# Patient Record
Sex: Female | Born: 1945 | Race: White | Hispanic: No | Smoking: Never smoker
Health system: Southern US, Community
[De-identification: ages and names within clinical notes are randomized; demographics above are authoritative.]

## PROBLEM LIST (undated history)

## (undated) DIAGNOSIS — T8859XA Other complications of anesthesia, initial encounter: Secondary | ICD-10-CM

## (undated) DIAGNOSIS — K644 Residual hemorrhoidal skin tags: Secondary | ICD-10-CM

## (undated) DIAGNOSIS — K8020 Calculus of gallbladder without cholecystitis without obstruction: Secondary | ICD-10-CM

## (undated) DIAGNOSIS — M7541 Impingement syndrome of right shoulder: Secondary | ICD-10-CM

## (undated) DIAGNOSIS — M1990 Unspecified osteoarthritis, unspecified site: Secondary | ICD-10-CM

## (undated) DIAGNOSIS — Z1371 Encounter for nonprocreative screening for genetic disease carrier status: Secondary | ICD-10-CM

## (undated) DIAGNOSIS — H269 Unspecified cataract: Secondary | ICD-10-CM

## (undated) DIAGNOSIS — Z9889 Other specified postprocedural states: Secondary | ICD-10-CM

## (undated) DIAGNOSIS — H3530 Unspecified macular degeneration: Secondary | ICD-10-CM

## (undated) DIAGNOSIS — I700 Atherosclerosis of aorta: Secondary | ICD-10-CM

## (undated) DIAGNOSIS — T4145XA Adverse effect of unspecified anesthetic, initial encounter: Secondary | ICD-10-CM

## (undated) DIAGNOSIS — R319 Hematuria, unspecified: Secondary | ICD-10-CM

## (undated) DIAGNOSIS — K648 Other hemorrhoids: Secondary | ICD-10-CM

## (undated) DIAGNOSIS — M8580 Other specified disorders of bone density and structure, unspecified site: Secondary | ICD-10-CM

## (undated) DIAGNOSIS — E039 Hypothyroidism, unspecified: Secondary | ICD-10-CM

## (undated) DIAGNOSIS — R053 Chronic cough: Secondary | ICD-10-CM

## (undated) DIAGNOSIS — E559 Vitamin D deficiency, unspecified: Secondary | ICD-10-CM

## (undated) DIAGNOSIS — E785 Hyperlipidemia, unspecified: Secondary | ICD-10-CM

## (undated) DIAGNOSIS — J479 Bronchiectasis, uncomplicated: Secondary | ICD-10-CM

## (undated) DIAGNOSIS — Z8042 Family history of malignant neoplasm of prostate: Secondary | ICD-10-CM

## (undated) DIAGNOSIS — Z8709 Personal history of other diseases of the respiratory system: Secondary | ICD-10-CM

## (undated) DIAGNOSIS — R0600 Dyspnea, unspecified: Secondary | ICD-10-CM

## (undated) DIAGNOSIS — C50919 Malignant neoplasm of unspecified site of unspecified female breast: Secondary | ICD-10-CM

## (undated) DIAGNOSIS — Z87442 Personal history of urinary calculi: Secondary | ICD-10-CM

## (undated) DIAGNOSIS — Z8719 Personal history of other diseases of the digestive system: Secondary | ICD-10-CM

## (undated) DIAGNOSIS — N950 Postmenopausal bleeding: Secondary | ICD-10-CM

## (undated) DIAGNOSIS — N840 Polyp of corpus uteri: Secondary | ICD-10-CM

## (undated) DIAGNOSIS — B009 Herpesviral infection, unspecified: Secondary | ICD-10-CM

## (undated) DIAGNOSIS — R911 Solitary pulmonary nodule: Secondary | ICD-10-CM

## (undated) DIAGNOSIS — Z8041 Family history of malignant neoplasm of ovary: Secondary | ICD-10-CM

## (undated) DIAGNOSIS — Z973 Presence of spectacles and contact lenses: Secondary | ICD-10-CM

## (undated) DIAGNOSIS — Z923 Personal history of irradiation: Secondary | ICD-10-CM

## (undated) DIAGNOSIS — D649 Anemia, unspecified: Secondary | ICD-10-CM

## (undated) DIAGNOSIS — M810 Age-related osteoporosis without current pathological fracture: Secondary | ICD-10-CM

## (undated) DIAGNOSIS — Z803 Family history of malignant neoplasm of breast: Secondary | ICD-10-CM

## (undated) DIAGNOSIS — H04129 Dry eye syndrome of unspecified lacrimal gland: Secondary | ICD-10-CM

## (undated) DIAGNOSIS — I890 Lymphedema, not elsewhere classified: Secondary | ICD-10-CM

## (undated) DIAGNOSIS — R112 Nausea with vomiting, unspecified: Secondary | ICD-10-CM

## (undated) DIAGNOSIS — R05 Cough: Secondary | ICD-10-CM

## (undated) DIAGNOSIS — K219 Gastro-esophageal reflux disease without esophagitis: Secondary | ICD-10-CM

## (undated) HISTORY — DX: Dry eye syndrome of unspecified lacrimal gland: H04.129

## (undated) HISTORY — DX: Encounter for nonprocreative screening for genetic disease carrier status: Z13.71

## (undated) HISTORY — DX: Family history of malignant neoplasm of prostate: Z80.42

## (undated) HISTORY — DX: Anemia, unspecified: D64.9

## (undated) HISTORY — DX: Age-related osteoporosis without current pathological fracture: M81.0

## (undated) HISTORY — DX: Family history of malignant neoplasm of ovary: Z80.41

## (undated) HISTORY — DX: Other specified disorders of bone density and structure, unspecified site: M85.80

## (undated) HISTORY — DX: Unspecified macular degeneration: H35.30

## (undated) HISTORY — DX: Family history of malignant neoplasm of breast: Z80.3

## (undated) HISTORY — DX: Unspecified osteoarthritis, unspecified site: M19.90

## (undated) HISTORY — DX: Malignant neoplasm of unspecified site of unspecified female breast: C50.919

## (undated) HISTORY — DX: Herpesviral infection, unspecified: B00.9

## (undated) HISTORY — PX: FOOT NEUROMA SURGERY: SHX646

## (undated) HISTORY — PX: TONSILLECTOMY AND ADENOIDECTOMY: SHX28

---
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
## 1979-05-17 HISTORY — PX: AUGMENTATION MAMMAPLASTY: SUR837

## 1979-05-17 HISTORY — PX: TUBAL LIGATION: SHX77

## 1980-05-16 HISTORY — PX: BREAST IMPLANT REMOVAL: SHX5361

## 1996-05-16 DIAGNOSIS — M810 Age-related osteoporosis without current pathological fracture: Secondary | ICD-10-CM

## 1996-05-16 HISTORY — DX: Age-related osteoporosis without current pathological fracture: M81.0

## 1997-12-14 HISTORY — PX: ELBOW SURGERY: SHX618

## 1998-08-21 ENCOUNTER — Other Ambulatory Visit: Admission: RE | Admit: 1998-08-21 | Discharge: 1998-08-21 | Payer: Self-pay | Admitting: Obstetrics and Gynecology

## 1998-11-27 ENCOUNTER — Other Ambulatory Visit: Admission: RE | Admit: 1998-11-27 | Discharge: 1998-11-27 | Payer: Self-pay | Admitting: Obstetrics and Gynecology

## 1999-06-11 ENCOUNTER — Other Ambulatory Visit: Admission: RE | Admit: 1999-06-11 | Discharge: 1999-06-11 | Payer: Self-pay | Admitting: Obstetrics and Gynecology

## 1999-07-12 ENCOUNTER — Encounter: Payer: Self-pay | Admitting: Obstetrics and Gynecology

## 1999-07-12 ENCOUNTER — Encounter: Admission: RE | Admit: 1999-07-12 | Discharge: 1999-07-12 | Payer: Self-pay | Admitting: Obstetrics and Gynecology

## 2000-05-21 ENCOUNTER — Encounter: Payer: Self-pay | Admitting: Emergency Medicine

## 2000-05-21 ENCOUNTER — Emergency Department (HOSPITAL_COMMUNITY): Admission: EM | Admit: 2000-05-21 | Discharge: 2000-05-21 | Payer: Self-pay | Admitting: Emergency Medicine

## 2000-06-12 ENCOUNTER — Other Ambulatory Visit: Admission: RE | Admit: 2000-06-12 | Discharge: 2000-06-12 | Payer: Self-pay | Admitting: Obstetrics and Gynecology

## 2000-07-17 ENCOUNTER — Encounter: Admission: RE | Admit: 2000-07-17 | Discharge: 2000-07-17 | Payer: Self-pay | Admitting: Obstetrics and Gynecology

## 2000-07-17 ENCOUNTER — Encounter: Payer: Self-pay | Admitting: Obstetrics and Gynecology

## 2001-06-11 ENCOUNTER — Encounter: Admission: RE | Admit: 2001-06-11 | Discharge: 2001-06-11 | Payer: Self-pay | Admitting: Obstetrics and Gynecology

## 2001-06-11 ENCOUNTER — Encounter: Payer: Self-pay | Admitting: Obstetrics and Gynecology

## 2001-08-20 ENCOUNTER — Encounter: Payer: Self-pay | Admitting: Obstetrics and Gynecology

## 2001-08-20 ENCOUNTER — Encounter: Admission: RE | Admit: 2001-08-20 | Discharge: 2001-08-20 | Payer: Self-pay | Admitting: Obstetrics and Gynecology

## 2002-07-08 ENCOUNTER — Other Ambulatory Visit: Admission: RE | Admit: 2002-07-08 | Discharge: 2002-07-08 | Payer: Self-pay | Admitting: Obstetrics and Gynecology

## 2002-09-16 ENCOUNTER — Encounter: Admission: RE | Admit: 2002-09-16 | Discharge: 2002-09-16 | Payer: Self-pay | Admitting: Obstetrics and Gynecology

## 2002-09-16 ENCOUNTER — Encounter: Payer: Self-pay | Admitting: Obstetrics and Gynecology

## 2002-12-23 ENCOUNTER — Encounter: Payer: Self-pay | Admitting: Family Medicine

## 2002-12-23 ENCOUNTER — Encounter: Admission: RE | Admit: 2002-12-23 | Discharge: 2002-12-23 | Payer: Self-pay | Admitting: Family Medicine

## 2003-02-14 HISTORY — PX: COLONOSCOPY: SHX174

## 2003-02-17 ENCOUNTER — Ambulatory Visit (HOSPITAL_COMMUNITY): Admission: RE | Admit: 2003-02-17 | Discharge: 2003-02-17 | Payer: Self-pay | Admitting: Gastroenterology

## 2003-07-14 ENCOUNTER — Other Ambulatory Visit: Admission: RE | Admit: 2003-07-14 | Discharge: 2003-07-14 | Payer: Self-pay | Admitting: Obstetrics and Gynecology

## 2003-09-29 ENCOUNTER — Ambulatory Visit (HOSPITAL_COMMUNITY): Admission: RE | Admit: 2003-09-29 | Discharge: 2003-09-29 | Payer: Self-pay | Admitting: Obstetrics and Gynecology

## 2004-02-25 ENCOUNTER — Encounter: Admission: RE | Admit: 2004-02-25 | Discharge: 2004-02-25 | Payer: Self-pay | Admitting: Otolaryngology

## 2004-07-22 ENCOUNTER — Other Ambulatory Visit: Admission: RE | Admit: 2004-07-22 | Discharge: 2004-07-22 | Payer: Self-pay | Admitting: Obstetrics and Gynecology

## 2004-10-15 ENCOUNTER — Encounter: Admission: RE | Admit: 2004-10-15 | Discharge: 2004-10-15 | Payer: Self-pay | Admitting: Obstetrics and Gynecology

## 2005-08-03 ENCOUNTER — Other Ambulatory Visit: Admission: RE | Admit: 2005-08-03 | Discharge: 2005-08-03 | Payer: Self-pay | Admitting: Obstetrics & Gynecology

## 2005-08-17 ENCOUNTER — Encounter: Admission: RE | Admit: 2005-08-17 | Discharge: 2005-08-17 | Payer: Self-pay | Admitting: Obstetrics & Gynecology

## 2005-10-17 ENCOUNTER — Encounter: Admission: RE | Admit: 2005-10-17 | Discharge: 2005-10-17 | Payer: Self-pay | Admitting: Obstetrics & Gynecology

## 2006-10-03 ENCOUNTER — Encounter: Admission: RE | Admit: 2006-10-03 | Discharge: 2006-10-03 | Payer: Self-pay | Admitting: Obstetrics & Gynecology

## 2006-11-07 ENCOUNTER — Other Ambulatory Visit: Admission: RE | Admit: 2006-11-07 | Discharge: 2006-11-07 | Payer: Self-pay | Admitting: Obstetrics and Gynecology

## 2006-11-21 ENCOUNTER — Encounter: Admission: RE | Admit: 2006-11-21 | Discharge: 2006-11-21 | Payer: Self-pay | Admitting: Obstetrics and Gynecology

## 2007-10-04 ENCOUNTER — Encounter: Admission: RE | Admit: 2007-10-04 | Discharge: 2007-10-04 | Payer: Self-pay | Admitting: Obstetrics & Gynecology

## 2007-11-09 ENCOUNTER — Other Ambulatory Visit: Admission: RE | Admit: 2007-11-09 | Discharge: 2007-11-09 | Payer: Self-pay | Admitting: Obstetrics and Gynecology

## 2008-04-07 HISTORY — PX: COLONOSCOPY: SHX174

## 2008-11-24 ENCOUNTER — Encounter: Admission: RE | Admit: 2008-11-24 | Discharge: 2008-11-24 | Payer: Self-pay | Admitting: Obstetrics and Gynecology

## 2009-11-27 ENCOUNTER — Encounter: Admission: RE | Admit: 2009-11-27 | Discharge: 2009-11-27 | Payer: Self-pay | Admitting: Obstetrics and Gynecology

## 2010-06-06 ENCOUNTER — Encounter: Payer: Self-pay | Admitting: Obstetrics & Gynecology

## 2010-10-01 NOTE — Op Note (Signed)
   NAMEHERBERT, Paula Dawson                      ACCOUNT NO.:  [id number]   MEDICAL RECORD NO.:  [id number]                   PATIENT TYPE:  AMB   LOCATION:  ENDO                                 FACILITY:  [facility]   PHYSICIAN:  Petra Kuba, M.D.                 DATE OF BIRTH:  1945/06/02   DATE OF PROCEDURE:  02/17/2003  DATE OF DISCHARGE:                                 OPERATIVE REPORT   PROCEDURE:  Colonoscopy.   ENDOSCOPIST:  Petra Kuba, M.D.   ANESTHESIA:  Demerol 60, Versed 6.   DESCRIPTION OF PROCEDURE:  Rectal inspection is pertinent for external  hemorrhoids, small.  Digital exam is negative.  The video pediatric  adjustable colonoscope was inserted, easily advanced around the colon to the  cecum.  This did require some abdominal pressure, but no position changes.  The cecum was identified by the appendiceal orifice and the ileocecal valve.  In fact, the scope was inserted a short ways into the terminal ileum which  was normal.  Photo documentation was obtained.  The scope was slowly  withdrawn.  The prep was adequate.  There was some liquid stool that  required washing and suctioning.  On slow withdrawal through the colon, no  abnormalities were seen.  There were no seen on insertion either.  Anorectal  pull through and retroflexion confirmed some small hemorrhoids.  The scope  was reinserted a short ways up the left side of the colon.  Air was  suctioned and the scope removed.  The patient tolerated the procedure well.  There was no obvious immediate complication.   ENDOSCOPIC DIAGNOSES:  1. Internal and external hemorrhoids.  2. Otherwise within normal limits to the terminal ileum.   PLAN:  Happy to see back p.r.n.  Otherwise, give her a followup in 2-3  months just to make sure no further workup plans are needed.  Otherwise,  return care to Dr. Cliffton Asters for the customary health care maintenance to  include yearly rectals and guaiacs and otherwise repeat screening  in five  years based on two sisters with colon polyps.                                               Petra Kuba, M.D.    MEM/MEDQ  D:  02/17/2003  T:  02/17/2003  Job:  [id number]

## 2010-11-19 ENCOUNTER — Other Ambulatory Visit: Payer: Self-pay | Admitting: Obstetrics & Gynecology

## 2010-11-19 DIAGNOSIS — Z1231 Encounter for screening mammogram for malignant neoplasm of breast: Secondary | ICD-10-CM

## 2010-12-01 ENCOUNTER — Ambulatory Visit
Admission: RE | Admit: 2010-12-01 | Discharge: 2010-12-01 | Disposition: A | Discharge status: Home / Self Care | Payer: 59 | Source: Ambulatory Visit | Attending: Obstetrics & Gynecology | Admitting: Obstetrics & Gynecology

## 2010-12-01 DIAGNOSIS — Z1231 Encounter for screening mammogram for malignant neoplasm of breast: Secondary | ICD-10-CM

## 2011-11-01 ENCOUNTER — Other Ambulatory Visit: Payer: Self-pay | Admitting: Obstetrics & Gynecology

## 2011-11-01 DIAGNOSIS — Z1231 Encounter for screening mammogram for malignant neoplasm of breast: Secondary | ICD-10-CM

## 2011-12-06 ENCOUNTER — Ambulatory Visit
Admission: RE | Admit: 2011-12-06 | Discharge: 2011-12-06 | Disposition: A | Discharge status: Home / Self Care | Payer: 59 | Source: Ambulatory Visit | Attending: Obstetrics & Gynecology | Admitting: Obstetrics & Gynecology

## 2011-12-06 DIAGNOSIS — Z1231 Encounter for screening mammogram for malignant neoplasm of breast: Secondary | ICD-10-CM

## 2012-08-14 HISTORY — PX: ESOPHAGOGASTRODUODENOSCOPY ENDOSCOPY: SHX5814

## 2012-11-19 ENCOUNTER — Other Ambulatory Visit: Payer: Self-pay

## 2012-11-19 DIAGNOSIS — Z1231 Encounter for screening mammogram for malignant neoplasm of breast: Secondary | ICD-10-CM

## 2012-12-11 ENCOUNTER — Ambulatory Visit
Admission: RE | Admit: 2012-12-11 | Discharge: 2012-12-11 | Disposition: A | Discharge status: Home / Self Care | Payer: 59 | Source: Ambulatory Visit

## 2012-12-11 DIAGNOSIS — Z1231 Encounter for screening mammogram for malignant neoplasm of breast: Secondary | ICD-10-CM

## 2013-01-30 ENCOUNTER — Other Ambulatory Visit: Payer: Self-pay | Admitting: Family Medicine

## 2013-02-04 ENCOUNTER — Other Ambulatory Visit: Payer: Self-pay | Admitting: Family Medicine

## 2013-02-04 DIAGNOSIS — M810 Age-related osteoporosis without current pathological fracture: Secondary | ICD-10-CM

## 2013-02-07 ENCOUNTER — Ambulatory Visit
Admission: RE | Admit: 2013-02-07 | Discharge: 2013-02-07 | Disposition: A | Discharge status: Home / Self Care | Payer: Medicare Other | Source: Ambulatory Visit | Attending: Family Medicine | Admitting: Family Medicine

## 2013-02-07 DIAGNOSIS — M810 Age-related osteoporosis without current pathological fracture: Secondary | ICD-10-CM

## 2013-02-27 ENCOUNTER — Other Ambulatory Visit: Payer: Self-pay | Admitting: Nurse Practitioner

## 2013-02-27 NOTE — Telephone Encounter (Signed)
eScribe request for refill on VALTREX 90 DAY SUPPLY Last AEX - 01/2012 Next AEX - 03/19/13 I spoke to the patient on the phone to discuss labs.  Pt has scheduled AEX for November.  RX sent until then.

## 2013-03-19 ENCOUNTER — Encounter: Payer: Self-pay | Admitting: Nurse Practitioner

## 2013-03-19 ENCOUNTER — Other Ambulatory Visit: Payer: 59

## 2013-03-19 ENCOUNTER — Ambulatory Visit (INDEPENDENT_AMBULATORY_CARE_PROVIDER_SITE_OTHER): Payer: 59 | Admitting: Nurse Practitioner

## 2013-03-19 VITALS — BP 120/64 | HR 64 | Resp 16 | Ht 62.25 in | Wt 121.0 lb

## 2013-03-19 DIAGNOSIS — Z01419 Encounter for gynecological examination (general) (routine) without abnormal findings: Secondary | ICD-10-CM

## 2013-03-19 MED ORDER — ERGOCALCIFEROL 1.25 MG (50000 UT) PO CAPS: 50000.0000 [IU] | ORAL_CAPSULE | ORAL | Status: DC

## 2013-03-19 MED ORDER — VALACYCLOVIR HCL 1 G PO TABS: ORAL_TABLET | ORAL | Status: DC

## 2013-03-19 MED ORDER — ESTRADIOL 2 MG VA RING: 2.0000 mg | VAGINAL_RING | VAGINAL | Status: DC

## 2013-03-19 NOTE — Progress Notes (Signed)
Patient ID: Paula Dawson, female   DOB: [birth year], 67 y.o.   MRN: [id number] 67 y.o. G2P2 Divorced Caucasian Fe here for annual exam. She has no new health concerns except GERD that ws diagnosed earlier this year wit EDG.  She has also decided not to take Prolia and just take extra calcium that was supposed to be better absorbed.   No LMP recorded. Patient is postmenopausal.          Sexually active: no  The current method of family planning is abstinence.    Exercising: yes  Home exercise routine includes walking three times per week. Smoker:  no  Health Maintenance: Pap:  02/13/12, WNL, neg HR HPV MMG:  12/11/12, normal Colonoscopy:  2009, repeat 5 years, pt has scheduled for 04/08/13. BMD:   02/07/13 T Score:  Spine: -2.9; left femur neck -2.5 stable TDaP:  02/08/11 Shingles Vaccine:  6/11 Labs: PCP   reports that she has never smoked. She has never used smokeless tobacco. She reports that she does not drink alcohol or use illicit drugs.  Past Medical History  Diagnosis Date  . Thyroid disease     hypo  . Osteopenia   . Anemia   . HSV infection     pos I and II    Past Surgical History  Procedure Laterality Date  . Tubal ligation    . Tonsillectomy and adenoidectomy    . Augmentation mammaplasty    . Breast implant removal    . Elbow surgery Left 1999    Current Outpatient Prescriptions  Medication Sig Dispense Refill  . Ascorbic Acid Buffered (BUFFERED VITAMIN C PO) Take 750 mg by mouth daily.      . B Complex Vitamins (B COMPLEX 100 PO) Take 1 tablet by mouth daily.      . Calcium Carbonate-Vitamin D (CALCIUM + D PO) Take 2,240 mg by mouth daily.      . ergocalciferol (VITAMIN D2) 50000 UNITS capsule Take 50,000 Units by mouth every 14 (fourteen) days.      Marland Kitchen estradiol (ESTRING) 2 MG vaginal ring Place 2 mg vaginally every 3 (three) months. follow package directions      . levothyroxine (SYNTHROID, LEVOTHROID) 88 MCG tablet Take 88 mcg by mouth daily before  breakfast.      . Multiple Vitamin (MULTIVITAMIN) tablet Take 1 tablet by mouth daily.      . pantoprazole (PROTONIX) 40 MG tablet Take 40 mg by mouth daily.      . valACYclovir (VALTREX) 1000 MG tablet TAKE 1 TABLET DAILY  90 tablet  0   No current facility-administered medications for this visit.    History reviewed. No pertinent family history.  ROS:  Pertinent items are noted in HPI.  Otherwise, a comprehensive ROS was negative.  Exam:   BP 120/64  Pulse 64  Resp 16  Ht 5' 2.25" (1.581 m)  Wt 121 lb (54.885 kg)  BMI 21.96 kg/m2 Height: 5' 2.25" (158.1 cm)  Ht Readings from Last 3 Encounters:  03/19/13 5' 2.25" (1.581 m)    General appearance: alert, cooperative and appears stated age Head: Normocephalic, without obvious abnormality, atraumatic Neck: no adenopathy, supple, symmetrical, trachea midline and thyroid normal to inspection and palpation Lungs: clear to auscultation bilaterally Breasts: normal appearance, no masses or tenderness Heart: regular rate and rhythm Abdomen: soft, non-tender; no masses,  no organomegaly Extremities: extremities normal, atraumatic, no cyanosis or edema Skin: Skin color, texture, turgor normal. No rashes or lesions Lymph nodes:  Cervical, supraclavicular, and axillary nodes normal. No abnormal inguinal nodes palpated Neurologic: Grossly normal   Pelvic: External genitalia:  no lesions              Urethra:  normal appearing urethra with no masses, tenderness or lesions              Bartholin's and Skene's: normal                 Vagina: normal appearing vagina with normal color and discharge, no lesions              Cervix: anteverted              Pap taken: no Bimanual Exam:  Uterus:  normal size, contour, position, consistency, mobility, non-tender              Adnexa: no mass, fullness, tenderness               Rectovaginal: Confirms               Anus:  normal sphincter tone, no lesions  A:  Well Woman with normal  exam  Postmenopausal HRT 1997-11/02  Strong [facility] for Breast Cancer  Atrophic vaginitis  History of HSV I /II  History of Vit D deficiency  Osteoporosis - off Fosamax, took Prolia X 3 doses - now off  P:   Pap smear as per guidelines   Mammogram due 7/15  Does not need a refill on Valtrex at this time - order is updated in Epic.  Refill on Vit D for a year - labs will be done by PCP  Refill on Estring for a year  Discussed potential risk with DVT, CVA, cancer, etc. counseled on breast self exam, adequate intake of calcium and vitamin D, diet and exercise return annually or prn  An After Visit Summary was printed and given to the patient.

## 2013-03-19 NOTE — Patient Instructions (Signed)

## 2013-03-20 NOTE — Progress Notes (Signed)
Reviewed personally.  M. Suzanne Jenesis Martin, MD.  

## 2013-03-22 ENCOUNTER — Telehealth: Payer: Self-pay | Admitting: Nurse Practitioner

## 2013-03-22 NOTE — Telephone Encounter (Signed)
Discussed with Dr. Hyacinth Meeker about patient's increased risk for breast cancer, given her strong family history of same.  She has not had BRCA testing but her sister and niece who had a recurrence did have the testing and was negative.  She is given the option of coming here and discussing with Dr. Hyacinth Meeker or going to the [facility] and discussing.  She believes that if her test was positive that she would not act on the option of mastectomy or oophorectomy.  She will give this some thought and prayer and if she decides to pursue this route will call us back.

## 2013-04-08 DIAGNOSIS — K644 Residual hemorrhoidal skin tags: Secondary | ICD-10-CM

## 2013-04-08 HISTORY — DX: Residual hemorrhoidal skin tags: K64.4

## 2013-04-08 HISTORY — PX: COLONOSCOPY: SHX174

## 2013-05-07 ENCOUNTER — Other Ambulatory Visit: Payer: Self-pay | Admitting: Nurse Practitioner

## 2013-06-14 ENCOUNTER — Encounter: Payer: Self-pay | Admitting: Nurse Practitioner

## 2013-11-14 ENCOUNTER — Other Ambulatory Visit: Payer: Self-pay

## 2013-11-14 DIAGNOSIS — Z803 Family history of malignant neoplasm of breast: Secondary | ICD-10-CM

## 2013-11-14 DIAGNOSIS — Z1231 Encounter for screening mammogram for malignant neoplasm of breast: Secondary | ICD-10-CM

## 2013-12-03 ENCOUNTER — Telehealth: Payer: Self-pay | Admitting: Nurse Practitioner

## 2013-12-03 MED ORDER — VALACYCLOVIR HCL 1 G PO TABS: ORAL_TABLET | ORAL | Status: DC

## 2013-12-03 NOTE — Telephone Encounter (Addendum)
Last AEX: 03/19/13 Last refill:03/19/13 #90, 3 Current AEX:not scheduled  Will send refills to cover until AEX Encounter closed

## 2013-12-03 NOTE — Telephone Encounter (Signed)
Patient is asking for medication to be sent now that she is ready for it needs 90 days supplies valACYclovir (VALTREX) 1000 MG tablet  TAKE 1 TABLET DAILY, No Print, Last Dose: Not Recorded  Refills: 3 ordered Pharmacy: [place], [place]

## 2013-12-09 ENCOUNTER — Telehealth: Payer: Self-pay | Admitting: Nurse Practitioner

## 2013-12-09 MED ORDER — VALACYCLOVIR HCL 1 G PO TABS: ORAL_TABLET | ORAL | Status: DC

## 2013-12-09 NOTE — Telephone Encounter (Signed)
Rx resent to [facility] for Valtrex 1000MG  #90 with 1RF until annual exam. Left message at number provided [phone] okay per patient and ROI. Ad[facility].  Routing to provider for final review. Patient agreeable to disposition. Will close encounter

## 2013-12-09 NOTE — Telephone Encounter (Signed)
Patient says prescription was not sent to [facility] for valACYclovir (VALTREX) 1000 MG tablet  TAKE 1 TABLET DAILY, No Print, Last Dose: Not Recorded  [facility] told patient to call our office and have prescription sent through again. Patient says you can leave a message when the prescription has been sent.

## 2013-12-16 ENCOUNTER — Telehealth: Payer: Self-pay | Admitting: Nurse Practitioner

## 2013-12-16 NOTE — Telephone Encounter (Signed)
Patient calling re: RX to [facility] not going through. [facility] will contact us about this in the next few days. No need to send until we hear from them per patient.  valACYclovir (VALTREX) 1000 MG tablet  TAKE 1 TABLET DAILY, No Print, Last Dose: Not Recorded

## 2013-12-17 ENCOUNTER — Other Ambulatory Visit: Payer: Self-pay

## 2013-12-17 MED ORDER — VALACYCLOVIR HCL 1 G PO TABS: ORAL_TABLET | ORAL | Status: DC

## 2013-12-17 NOTE — Telephone Encounter (Signed)
Telephone encounter 12/09/13. pharmancy did not receive the rx. Pharmacy faxed another request  [facility]  Encounter closed

## 2013-12-18 NOTE — Telephone Encounter (Signed)
Rx sent on 12/17/13 [facility].  Encounter closed

## 2013-12-19 ENCOUNTER — Ambulatory Visit
Admission: RE | Admit: 2013-12-19 | Discharge: 2013-12-19 | Disposition: A | Discharge status: Home / Self Care | Payer: 59 | Source: Ambulatory Visit

## 2013-12-19 DIAGNOSIS — Z803 Family history of malignant neoplasm of breast: Secondary | ICD-10-CM

## 2013-12-19 DIAGNOSIS — Z1231 Encounter for screening mammogram for malignant neoplasm of breast: Secondary | ICD-10-CM

## 2014-03-17 ENCOUNTER — Encounter: Payer: Self-pay | Admitting: Nurse Practitioner

## 2014-04-02 ENCOUNTER — Other Ambulatory Visit: Payer: Self-pay | Admitting: Nurse Practitioner

## 2014-04-02 NOTE — Telephone Encounter (Signed)
Last refilled/Last AEX: 03/19/13 #30/3 rfs was sent by Ms. Patty AEX Scheduled: no current AEX scheduled  Left Message To Call Back

## 2014-04-07 MED ORDER — ESTRADIOL 2 MG VA RING: 2.0000 mg | VAGINAL_RING | VAGINAL | Status: DC

## 2014-04-07 NOTE — Telephone Encounter (Signed)
AEX 05/29/14 with Waldemar Dickens, NP.

## 2014-04-07 NOTE — Addendum Note (Signed)
Addended by: Alfonzo Feller on: 04/07/2014 09:58 AM   Modules accepted: Orders

## 2014-04-07 NOTE — Telephone Encounter (Signed)
S/w patient she said she doesn't need Vitamin D, she goes on to state that she got a e-mail from [place] in regards to her Estring, patient states she s/w Ms. Patty last year and they discussed her skipping the estring for November and December and possible restarting it again. Patient said she was feeling a little leary with taking it. But since her appointment is scheduled for January she would like to restart the estring January 1st before her appointment in January.  Please advise okay to send in 1 ring to last patient?

## 2014-04-07 NOTE — Telephone Encounter (Signed)
Patient notified that rx has been sent to [facility].

## 2014-05-29 ENCOUNTER — Encounter: Payer: Self-pay | Admitting: Nurse Practitioner

## 2014-05-29 ENCOUNTER — Ambulatory Visit (INDEPENDENT_AMBULATORY_CARE_PROVIDER_SITE_OTHER): Payer: Medicare Other | Admitting: Nurse Practitioner

## 2014-05-29 VITALS — BP 116/76 | HR 80 | Resp 16 | Ht 62.25 in | Wt 130.6 lb

## 2014-05-29 DIAGNOSIS — Z01419 Encounter for gynecological examination (general) (routine) without abnormal findings: Secondary | ICD-10-CM

## 2014-05-29 DIAGNOSIS — Z0000 Encounter for general adult medical examination without abnormal findings: Secondary | ICD-10-CM

## 2014-05-29 DIAGNOSIS — E559 Vitamin D deficiency, unspecified: Secondary | ICD-10-CM

## 2014-05-29 LAB — POCT URINALYSIS DIPSTICK
LEUKOCYTES UA: NEGATIVE
Urobilinogen, UA: NEGATIVE
pH, UA: 5

## 2014-05-29 MED ORDER — ERGOCALCIFEROL 1.25 MG (50000 UT) PO CAPS: 50000.0000 [IU] | ORAL_CAPSULE | ORAL | Status: DC

## 2014-05-29 MED ORDER — VALACYCLOVIR HCL 1 G PO TABS: ORAL_TABLET | ORAL | Status: DC

## 2014-05-29 NOTE — Progress Notes (Signed)
69 y.o. G2P2 Divorced Caucasian Fe here for annual exam. No vaso symptoms.  Some insomnia now better on Melatonin.   On calcium aspartate Anhydrous 1120 mg 2 per day.  Patient's last menstrual period was 05/16/2000.          Sexually active: No.  The current method of family planning is post menopausal status.    Exercising: Yes.    walking 2x/wk Smoker:  no  Health Maintenance: Pap:  02/13/12 NEG HR HPV negative  MMG:  12/20/13 Bi-Rads 1: Negative Colonoscopy:  04/08/13 f/u in 2019 BMD:   02/07/13 took Prolia for 1.5 years and now on calcium TDaP:  02/08/11  Labs: Hgb: 9.9  (Last gave blood at [facility] in November); Urine: Negative    reports that she has never smoked. She has never used smokeless tobacco. She reports that she does not drink alcohol or use illicit drugs.  Past Medical History  Diagnosis Date  . Osteopenia   . Anemia   . HSV infection     pos I and II  . Osteoporosis 1998    Off Fosomax 2008, Prolia 03/17/11, 09/14/11, 03/16/12  . Thyroid disease age 39    hypo  . Arthritis     Past Surgical History  Procedure Laterality Date  . Tonsillectomy and adenoidectomy    . Breast implant removal  1982  . Elbow surgery Left 12/1997  . Foot neuroma surgery Left 2004?  Marland Kitchen Augmentation mammaplasty  1981  . Tubal ligation  1981  . Colonoscopy  10/04    Dr Watt Climes  . Colonoscopy  04/07/08    normal - Dr. Watt Climes  . Esophagogastroduodenoscopy endoscopy  08/2012    [place] hernia with GERD    Current Outpatient Prescriptions  Medication Sig Dispense Refill  . IBUPROFEN PO Take by mouth as needed.    . Ascorbic Acid Buffered (BUFFERED VITAMIN C PO) Take 750 mg by mouth daily.    . B Complex Vitamins (B COMPLEX 100 PO) Take 1 tablet by mouth daily.    . Calcium Carbonate-Vitamin D (CALCIUM + D PO) Take 2,240 mg by mouth daily.    . ergocalciferol (VITAMIN D2) 50000 UNITS capsule Take 1 capsule (50,000 Units total) by mouth once a week. 30 capsule 3  . estradiol (ESTRING) 2 MG  vaginal ring Place 2 mg vaginally every 3 (three) months. follow package directions 1 each 0  . levothyroxine (SYNTHROID, LEVOTHROID) 88 MCG tablet Take 88 mcg by mouth daily before breakfast.    . Multiple Vitamin (MULTIVITAMIN) tablet Take 1 tablet by mouth daily.    . pantoprazole (PROTONIX) 40 MG tablet Take 40 mg by mouth daily.    . valACYclovir (VALTREX) 1000 MG tablet TAKE 1 TABLET DAILY 90 tablet 3   No current facility-administered medications for this visit.    Family History  Problem Relation Age of Onset  . Heart failure Mother   . Prostate cancer Father 61  . Breast cancer Sister 3    recurrence 20 yrs later  . Colon polyps Sister   . Breast cancer Sister 41  . Colon polyps Sister   . Breast cancer Sister 58  . Breast cancer Sister 25  . Ovarian cancer Cousin     died at 34  . Breast cancer Other     X 4 + age 4, 11 with recurrence, 9, 50    ROS:  Pertinent items are noted in HPI.  Otherwise, a comprehensive ROS was negative.  Exam:  BP 116/76 mmHg  Pulse 80  Resp 16  Ht 5' 2.25" (1.581 m)  Wt 130 lb 9.6 oz (59.24 kg)  BMI 23.70 kg/m2  LMP 05/16/2000 Height: 5' 2.25" (158.1 cm) Ht Readings from Last 3 Encounters:  05/29/14 5' 2.25" (1.581 m)  03/19/13 5' 2.25" (1.581 m)    General appearance: alert, cooperative and appears stated age Head: Normocephalic, without obvious abnormality, atraumatic Neck: no adenopathy, supple, symmetrical, trachea midline and thyroid normal to inspection and palpation Lungs: clear to auscultation bilaterally Breasts: normal appearance, no masses or tenderness Heart: regular rate and rhythm Abdomen: soft, non-tender; no masses,  no organomegaly Extremities: extremities normal, atraumatic, no cyanosis or edema Skin: Skin color, texture, turgor normal. No rashes or lesions Lymph nodes: Cervical, supraclavicular, and axillary nodes normal. No abnormal inguinal nodes palpated Neurologic: Grossly normal   Pelvic: External  genitalia:  no lesions              Urethra:  normal appearing urethra with no masses, tenderness or lesions              Bartholin's and Skene's: normal                 Vagina: normal appearing vagina with normal color and discharge, no lesions              Cervix: anteverted              Pap taken: Yes.   Bimanual Exam:  Uterus:  normal size, contour, position, consistency, mobility, non-tender              Adnexa: no mass, fullness, tenderness               Rectovaginal: Confirms               Anus:  normal sphincter tone, no lesions  Chaperone present: yes  A:  Well Woman with normal exam  Postmenopausal HRT 1997-11/02 Strong [place] for Breast Cancer Atrophic vaginitis wants to change from Estring to Vagifem secondary to insurance coverage History of HSV I /II History of Vit D deficiency Osteoporosis - off Fosamax, took Prolia X 3 doses - now off  P:   Reviewed health and wellness pertinent to exam  Pap smear taken today  She will call us when ready to get new RX for Vagifem to use twice weekly  Counseled with risk of CVT, CVA, cancer, etc  Mammogram is due 8/16  Counseled on breast self exam, mammography screening, use and side effects of HRT, adequate intake of calcium and vitamin D, diet and exercise, Kegel's exercises return annually or prn  An After Visit Summary was printed and given to the patient.

## 2014-05-29 NOTE — Patient Instructions (Addendum)

## 2014-05-30 LAB — HEMOGLOBIN, FINGERSTICK: Hemoglobin, fingerstick: 9.9 g/dL — ABNORMAL LOW (ref 12.0–16.0)

## 2014-05-30 LAB — VITAMIN D 25 HYDROXY (VIT D DEFICIENCY, FRACTURES): VIT D 25 HYDROXY: 54 ng/mL (ref 30–100)

## 2014-06-01 NOTE — Progress Notes (Signed)
Encounter reviewed by Dr. Brook Silva.  

## 2014-06-02 ENCOUNTER — Telehealth: Payer: Self-pay | Admitting: *Deleted

## 2014-06-02 LAB — IPS PAP SMEAR ONLY

## 2014-06-02 NOTE — Telephone Encounter (Signed)
Patient notified (see result)

## 2014-06-02 NOTE — Telephone Encounter (Signed)
Left Message To Call Back  

## 2014-06-02 NOTE — Telephone Encounter (Signed)
-----   Message from Milford Cage, [place] sent at 06/02/2014  8:48 AM EST ----- Let patient know that Vit D is normal and to continue but now at every other week.  She may want to do OTC Vit D at 1000 IU daily.

## 2014-06-16 ENCOUNTER — Other Ambulatory Visit: Payer: Self-pay | Admitting: Nurse Practitioner

## 2014-06-16 NOTE — Telephone Encounter (Signed)
Pt returning call

## 2014-06-16 NOTE — Telephone Encounter (Signed)
Called patient and she states that the Estring, vagifem and  Premarin was too much for her.   The only affordable Rx they recommended was Estrace tablet generic only, the cream is too much.  Please advise.

## 2014-06-16 NOTE — Telephone Encounter (Signed)
This patient took HRT from 1997 - 03/2001.  It is not advised to restart HRT with oral tablet this late as it may increase risk of heart disease with CVA, DVT.  Also she has strong [facility] for breast cancer.  She can use vaginal estrogen but none of these are generic.  Brand name only which makes them expensive.

## 2014-06-16 NOTE — Telephone Encounter (Signed)
Medication refill request: Estring 2mg  Last AEX:  05/29/14 Next AEX: Not scheduled  Last MMG (if hormonal medication request): 12/20/13 [id number]:neg Refill authorized: 04/07/14 #1each/0R. Today?   "Atrophic vaginitis wants to change from Estring to Vagifem secondary to insurance coverage She will call us when ready to get new RX for Vagifem to use twice weekly" - Mrs Paula Dawson's note 05/29/14  -Called patient. She will call insurance to see how much Vagifem would be for her. - Will call back.

## 2014-06-17 NOTE — Telephone Encounter (Signed)
LM for pt to call back.

## 2014-06-18 NOTE — Telephone Encounter (Signed)
LM for pt to call back.

## 2014-06-18 NOTE — Telephone Encounter (Signed)
Spoke with patient. Advised patient of message as seen below from Paula Dawson, [place]. Patient is agreeable. "I am at work and left my sheet at home. I will need to check on the prices and call back tomorrow. I am not sure I will be able to do anything at this point but will call back."  Cc: Emelia Salisbury  Encounter was previously closed.

## 2014-06-18 NOTE — Telephone Encounter (Signed)
Returning call.

## 2014-10-30 ENCOUNTER — Telehealth: Payer: Self-pay | Admitting: Nurse Practitioner

## 2014-10-30 NOTE — Telephone Encounter (Signed)
Left message to call Paula Dawson at [phone]. 

## 2014-10-30 NOTE — Telephone Encounter (Signed)
Pt states she is calling regarding a prescription change - estring. Pt states she has contacted insurance and would like to go forward with the prescription.

## 2014-10-31 MED ORDER — ESTRADIOL 2 MG VA RING: 2.0000 mg | VAGINAL_RING | VAGINAL | Status: DC

## 2014-10-31 NOTE — Telephone Encounter (Signed)
Spoke with patient. Patient states that at her aex appointment in January she discussed alternative medications to [place]. She has decided to continue with Estring after speaking with her insurance company. Patient is requesting new rx for Estring be sent over to mail order pharmacy on file. Rx for Estring #1 3RF sent to mail order pharmacy on file. Patient is agreeable.  Routing to provider for final review. Patient agreeable to disposition. Will close encounter.   Patient aware provider will review message and nurse will return call if any additional advice or change of disposition.

## 2014-11-26 ENCOUNTER — Other Ambulatory Visit: Payer: Self-pay

## 2014-11-26 DIAGNOSIS — Z1231 Encounter for screening mammogram for malignant neoplasm of breast: Secondary | ICD-10-CM

## 2014-12-30 ENCOUNTER — Other Ambulatory Visit: Payer: Self-pay | Admitting: Family Medicine

## 2014-12-30 DIAGNOSIS — M810 Age-related osteoporosis without current pathological fracture: Secondary | ICD-10-CM

## 2014-12-31 ENCOUNTER — Ambulatory Visit
Admission: RE | Admit: 2014-12-31 | Discharge: 2014-12-31 | Disposition: A | Discharge status: Home / Self Care | Payer: Medicare Other | Source: Ambulatory Visit

## 2014-12-31 DIAGNOSIS — Z1231 Encounter for screening mammogram for malignant neoplasm of breast: Secondary | ICD-10-CM

## 2015-02-09 ENCOUNTER — Other Ambulatory Visit: Payer: Self-pay

## 2015-03-11 ENCOUNTER — Ambulatory Visit
Admission: RE | Admit: 2015-03-11 | Discharge: 2015-03-11 | Disposition: A | Discharge status: Home / Self Care | Payer: Medicare Other | Source: Ambulatory Visit | Attending: Family Medicine | Admitting: Family Medicine

## 2015-03-11 DIAGNOSIS — M810 Age-related osteoporosis without current pathological fracture: Secondary | ICD-10-CM

## 2015-04-06 ENCOUNTER — Telehealth: Payer: Self-pay | Admitting: Nurse Practitioner

## 2015-04-06 ENCOUNTER — Ambulatory Visit (INDEPENDENT_AMBULATORY_CARE_PROVIDER_SITE_OTHER): Payer: Medicare Other | Admitting: Obstetrics and Gynecology

## 2015-04-06 ENCOUNTER — Encounter: Payer: Self-pay | Admitting: Obstetrics and Gynecology

## 2015-04-06 VITALS — BP 112/60 | HR 80 | Resp 16 | Wt 115.0 lb

## 2015-04-06 DIAGNOSIS — R634 Abnormal weight loss: Secondary | ICD-10-CM | POA: Diagnosis not present

## 2015-04-06 DIAGNOSIS — R5383 Other fatigue: Secondary | ICD-10-CM

## 2015-04-06 DIAGNOSIS — R1030 Lower abdominal pain, unspecified: Secondary | ICD-10-CM | POA: Diagnosis not present

## 2015-04-06 LAB — COMPREHENSIVE METABOLIC PANEL
ALBUMIN: 4 g/dL (ref 3.6–5.1)
ALT: 18 U/L (ref 6–29)
AST: 18 U/L (ref 10–35)
Alkaline Phosphatase: 96 U/L (ref 33–130)
BUN: 19 mg/dL (ref 7–25)
CO2: 30 mmol/L (ref 20–31)
CREATININE: 0.7 mg/dL (ref 0.50–0.99)
Calcium: 9.5 mg/dL (ref 8.6–10.4)
Chloride: 103 mmol/L (ref 98–110)
Glucose, Bld: 82 mg/dL (ref 65–99)
POTASSIUM: 4.5 mmol/L (ref 3.5–5.3)
Sodium: 139 mmol/L (ref 135–146)
TOTAL PROTEIN: 6.2 g/dL (ref 6.1–8.1)
Total Bilirubin: 0.4 mg/dL (ref 0.2–1.2)

## 2015-04-06 LAB — CBC WITH DIFFERENTIAL/PLATELET
BASOS ABS: 0 10*3/uL (ref 0.0–0.1)
Basophils Relative: 0 % (ref 0–1)
EOS PCT: 1 % (ref 0–5)
Eosinophils Absolute: 0.1 10*3/uL (ref 0.0–0.7)
HCT: 47.3 % — ABNORMAL HIGH (ref 36.0–46.0)
Hemoglobin: 16.3 g/dL — ABNORMAL HIGH (ref 12.0–15.0)
LYMPHS ABS: 1.6 10*3/uL (ref 0.7–4.0)
LYMPHS PCT: 15 % (ref 12–46)
MCH: 32.3 pg (ref 26.0–34.0)
MCHC: 34.5 g/dL (ref 30.0–36.0)
MCV: 93.7 fL (ref 78.0–100.0)
MONOS PCT: 5 % (ref 3–12)
MPV: 9.9 fL (ref 8.6–12.4)
Monocytes Absolute: 0.5 10*3/uL (ref 0.1–1.0)
Neutro Abs: 8.5 10*3/uL — ABNORMAL HIGH (ref 1.7–7.7)
Neutrophils Relative %: 79 % — ABNORMAL HIGH (ref 43–77)
Platelets: 277 10*3/uL (ref 150–400)
RBC: 5.05 MIL/uL (ref 3.87–5.11)
RDW: 13.7 % (ref 11.5–15.5)
WBC: 10.7 10*3/uL — ABNORMAL HIGH (ref 4.0–10.5)

## 2015-04-06 LAB — TSH: TSH: 0.811 u[IU]/mL (ref 0.350–4.500)

## 2015-04-06 NOTE — Telephone Encounter (Signed)
Patient said she is  having a "pulling sensation near my ovaries and lost about 15 pounds in last 6 months". Last seen 05/29/14.

## 2015-04-06 NOTE — Progress Notes (Signed)
Patient ID: Paula Dawson, female   DOB: 05-29-1945, 69 y.o.   MRN: [id number] GYNECOLOGY  VISIT   HPI: 69 y.o.   Divorced  Caucasian  female   G36P2 with Patient's last menstrual period was 05/16/2000.   Here c/o a pulling sensation from her ovaries. She has lost 15 pounds.  She c/o BLQ discomfort for the last 2 months. Intermittent pulling sensation, that occurs when she is walking or standing for period of time. Discomfort is up to a 5/10 in severity. She has had issues with diarrhea for years. The diarrhea occurs about 1 x week, can go to the bathroom 6-7 x in a morning at times, watery. Otherwise she reports a normal BM qd. The diarrhea is always in the morning, doesn't appear to be diet related. She has lost 15 lbs in the last 6 months, she wasn't trying, but did change her diet in May to try and eat more healthy. Stopped drinking soda and sweets. She has been eating anything she wants lately can't gain the weight back. Eating normal amounts. No nausea or emesis. She is a little tired.  Last colonoscopy was 3 years ago, due in another 2 years  GYNECOLOGIC HISTORY: Patient's last menstrual period was 05/16/2000. Contraception:post menopause Menopausal hormone therapy: Estring         OB History    Gravida Para Term Preterm AB TAB SAB Ectopic Multiple Living   2 2        1          There are no active problems to display for this patient.   Past Medical History  Diagnosis Date  . Osteopenia   . Anemia   . HSV infection     pos I and II  . Osteoporosis 1998    Off Fosomax 2008, Prolia 03/17/11, 09/14/11, 03/16/12  . Thyroid disease age 41    hypo  . Arthritis   . Dry eye syndrome   . Macular degeneration of both eyes     Past Surgical History  Procedure Laterality Date  . Tonsillectomy and adenoidectomy    . Breast implant removal  1982  . Elbow surgery Left 12/1997  . Foot neuroma surgery Left 2004?  Marland Kitchen Augmentation mammaplasty  1981  . Tubal ligation  1981  .  Colonoscopy  10/04    Dr Watt Climes  . Colonoscopy  04/07/08    normal - Dr. Watt Climes  . Esophagogastroduodenoscopy endoscopy  08/2012    [place] hernia with GERD    Current Outpatient Prescriptions  Medication Sig Dispense Refill  . Ascorbic Acid Buffered (BUFFERED VITAMIN C PO) Take 750 mg by mouth daily.    . B Complex Vitamins (B COMPLEX 100 PO) Take 1 tablet by mouth daily.    . Calcium Carbonate-Vitamin D (CALCIUM + D PO) Take 2,240 mg by mouth daily.    . ergocalciferol (VITAMIN D2) 50000 UNITS capsule Take 1 capsule (50,000 Units total) by mouth once a week. 30 capsule 3  . estradiol (ESTRING) 2 MG vaginal ring Place 2 mg vaginally every 3 (three) months. follow package directions 1 each 3  . Flaxseed, Linseed, (FLAX SEED OIL PO) Take by mouth.    . levothyroxine (SYNTHROID, LEVOTHROID) 88 MCG tablet Take 88 mcg by mouth daily before breakfast.    . Multiple Vitamin (MULTIVITAMIN) tablet Take 1 tablet by mouth daily.    . Multiple Vitamins-Minerals (ICAPS AREDS 2 PO) Take by mouth.    . RESTASIS 0.05 % ophthalmic emulsion     .  valACYclovir (VALTREX) 1000 MG tablet TAKE 1 TABLET DAILY 90 tablet 3   No current facility-administered medications for this visit.     ALLERGIES: Review of patient's allergies indicates no known allergies.  Family History  Problem Relation Age of Onset  . Heart failure Mother   . Prostate cancer Father 13  . Breast cancer Sister 19    recurrence 20 yrs later  . Colon polyps Sister   . Breast cancer Sister 79  . Colon polyps Sister   . Breast cancer Sister 87  . Breast cancer Sister 58  . Ovarian cancer Cousin     died at 82  . Breast cancer Other     X 4 + age 63, 41 with recurrence, 44, 74    Social History   Social History  . Marital Status: Divorced    Spouse Name: N/A  . Number of Children: 1  . Years of Education: N/A   Occupational History  . Not on file.   Social History Main Topics  . Smoking status: Never Smoker   . Smokeless  tobacco: Never Used  . Alcohol Use: No  . Drug Use: No  . Sexual Activity: No   Other Topics Concern  . Not on file   Social History Narrative    Review of Systems  Constitutional: Positive for weight loss.  Genitourinary:       Pulling sensation at ovaries   All other systems reviewed and are negative.   PHYSICAL EXAMINATION:    BP 112/60 mmHg  Pulse 80  Resp 16  Wt 115 lb (52.164 kg)  LMP 05/16/2000    General appearance: alert, cooperative and appears stated age Neck: no adenopathy, supple, symmetrical, trachea midline and thyroid normal to inspection and palpation Abdomen: soft, non-tender; bowel sounds normal; no masses,  no organomegaly, mildly distended  Pelvic: External genitalia:  no lesions              Urethra:  normal appearing urethra with no masses, tenderness or lesions              Bartholins and Skenes: normal                 Vagina: normal appearing vagina with normal color and discharge, no lesions              Cervix: no lesions              Bimanual Exam:  Uterus:  normal size, contour, position, consistency, mobility, non-tender and anteverted              Adnexa: no mass, fullness, tenderness              Rectovaginal: Yes.  .  Confirms.              Anus:  normal sphincter tone, no lesions  Chaperone was present for exam.  ASSESSMENT BLQ abdominal pain, no findings on exam, no change in bowels (long h/o intermittent diarrhea) Weight loss Fatigue    PLAN CBC, TSH, CMP Return for a GYN ultrasound   An After Visit Summary was printed and given to the patient.

## 2015-04-06 NOTE — Telephone Encounter (Signed)
Spoke with patient. Patient states that for the last 2 months she has been experiencing a "pulling" sensation close to her ovaries. Over the last 6 months she has lost 16 pounds without trying and is unable to gain any weight back. Denies any abdominal bloating, nausea, vomiting, diarrhea, and fever. Advised she will need to be seen in the office with MD for further evaluation. Patient is agreeable. Appointment scheduled for today at 1 pm with Dr.Jertson. Agreeable to date and time.  Routing to provider for final review. Patient agreeable to disposition. Will close encounter.

## 2015-04-08 ENCOUNTER — Telehealth: Payer: Self-pay | Admitting: *Deleted

## 2015-04-08 NOTE — Telephone Encounter (Signed)
Patient called back. I went over her lab results and faxed them to her PCP

## 2015-04-08 NOTE — Telephone Encounter (Signed)
04-08-15 [facility] for lab results -eh

## 2015-04-08 NOTE — Telephone Encounter (Signed)
-----   Message from Salvadore Dom, MD sent at 04/08/2015 11:36 AM EST ----- Please inform the patient that her WBC is just above normal and her Hgb/Hct are both elevated (oposite of anemia). Her metabolic panel and TSH were normal. It's probably nothing, but she should have further evaluation with her primary. Please send a copy of her labs to her primary.

## 2015-04-13 ENCOUNTER — Telehealth: Payer: Self-pay | Admitting: Obstetrics and Gynecology

## 2015-04-13 NOTE — Telephone Encounter (Signed)
Patient returning call.

## 2015-04-13 NOTE — Telephone Encounter (Signed)
Returned call. Left voicemail for return call.

## 2015-04-13 NOTE — Telephone Encounter (Signed)
Called patient to review benefits for ultrasound. Left voicemail to call back and review. °

## 2015-04-13 NOTE — Telephone Encounter (Signed)
Return call to Becky. °

## 2015-04-14 NOTE — Telephone Encounter (Signed)
Reviewed benefit information with patient for pelvic ultrasound. Patient understood and agreeable. Patient ready to schedule. Patient scheduled for 04/21/15 @3pm . Patient aware and agreeable to arrival date/time. Patient aware and agreeable to 72 hour cancellation policy with [id number] fee. No further questions. Ok to close.

## 2015-04-21 ENCOUNTER — Ambulatory Visit (INDEPENDENT_AMBULATORY_CARE_PROVIDER_SITE_OTHER): Payer: Medicare Other

## 2015-04-21 ENCOUNTER — Ambulatory Visit (INDEPENDENT_AMBULATORY_CARE_PROVIDER_SITE_OTHER): Payer: Medicare Other | Admitting: Obstetrics and Gynecology

## 2015-04-21 ENCOUNTER — Encounter: Payer: Self-pay | Admitting: Obstetrics and Gynecology

## 2015-04-21 VITALS — BP 102/60 | HR 60 | Resp 16 | Wt 116.0 lb

## 2015-04-21 DIAGNOSIS — R634 Abnormal weight loss: Secondary | ICD-10-CM

## 2015-04-21 DIAGNOSIS — D751 Secondary polycythemia: Secondary | ICD-10-CM

## 2015-04-21 DIAGNOSIS — R1030 Lower abdominal pain, unspecified: Secondary | ICD-10-CM | POA: Diagnosis not present

## 2015-04-21 DIAGNOSIS — R5383 Other fatigue: Secondary | ICD-10-CM

## 2015-04-21 NOTE — Progress Notes (Signed)
GYNECOLOGY  VISIT   HPI: 69 y.o.   Divorced  Caucasian  female   G82P2 with Patient's last menstrual period was 05/16/2000.   here for a pelvic U/S secondary to BLQ abdominal pain, weight loss and fatigue. She has long term issues with intermittent diarrhea, no change. She had a colonoscopy since her diarrhea started and it was normal. She thinks the diarrhea is from anxiety. She has lost 15 lbs between May and August, now maintaining. She did make dietary changes prior to her weight loss. Unable to gain weight.  GYNECOLOGIC HISTORY: Patient's last menstrual period was 05/16/2000. Contraception:post menospause Menopausal hormone therapy: Estring         OB History    Gravida Para Term Preterm AB TAB SAB Ectopic Multiple Living   2 2        1          There are no active problems to display for this patient.   Past Medical History  Diagnosis Date  . Osteopenia   . Anemia   . HSV infection     pos I and II  . Osteoporosis 1998    Off Fosomax 2008, Prolia 03/17/11, 09/14/11, 03/16/12  . Thyroid disease age 89    hypo  . Arthritis   . Dry eye syndrome   . Macular degeneration of both eyes     Past Surgical History  Procedure Laterality Date  . Tonsillectomy and adenoidectomy    . Breast implant removal  1982  . Elbow surgery Left 12/1997  . Foot neuroma surgery Left 2004?  Marland Kitchen Augmentation mammaplasty  1981  . Tubal ligation  1981  . Colonoscopy  10/04    Dr Watt Climes  . Colonoscopy  04/07/08    normal - Dr. Watt Climes  . Esophagogastroduodenoscopy endoscopy  08/2012    [place] hernia with GERD    Current Outpatient Prescriptions  Medication Sig Dispense Refill  . Ascorbic Acid Buffered (BUFFERED VITAMIN C PO) Take 750 mg by mouth daily.    . B Complex Vitamins (B COMPLEX 100 PO) Take 1 tablet by mouth daily.    . Calcium Carbonate-Vitamin D (CALCIUM + D PO) Take 2,240 mg by mouth daily.    . ergocalciferol (VITAMIN D2) 50000 UNITS capsule Take 1 capsule (50,000 Units total)  by mouth once a week. 30 capsule 3  . estradiol (ESTRING) 2 MG vaginal ring Place 2 mg vaginally every 3 (three) months. follow package directions 1 each 3  . Flaxseed, Linseed, (FLAX SEED OIL PO) Take by mouth.    . levothyroxine (SYNTHROID, LEVOTHROID) 88 MCG tablet Take 88 mcg by mouth daily before breakfast.    . Multiple Vitamin (MULTIVITAMIN) tablet Take 1 tablet by mouth daily.    . RESTASIS 0.05 % ophthalmic emulsion     . valACYclovir (VALTREX) 1000 MG tablet TAKE 1 TABLET DAILY 90 tablet 3   No current facility-administered medications for this visit.     ALLERGIES: Review of patient's allergies indicates no known allergies.  Family History  Problem Relation Age of Onset  . Heart failure Mother   . Prostate cancer Father 6  . Breast cancer Sister 29    recurrence 20 yrs later  . Colon polyps Sister   . Breast cancer Sister 35  . Colon polyps Sister   . Breast cancer Sister 54  . Breast cancer Sister 54  . Ovarian cancer Cousin     died at 4  . Breast cancer Other  X 4 + age 76, 42 with recurrence, 78, 17    Social History   Social History  . Marital Status: Divorced    Spouse Name: N/A  . Number of Children: 1  . Years of Education: N/A   Occupational History  . Not on file.   Social History Main Topics  . Smoking status: Never Smoker   . Smokeless tobacco: Never Used  . Alcohol Use: No  . Drug Use: No  . Sexual Activity: No   Other Topics Concern  . Not on file   Social History Narrative    Review of Systems  Gastrointestinal: Positive for abdominal pain.       Lower abdominal pain  All other systems reviewed and are negative.   PHYSICAL EXAMINATION:    BP 102/60 mmHg  Pulse 60  Resp 16  Wt 116 lb (52.617 kg)  LMP 05/16/2000    General appearance: alert, cooperative and appears stated age  ASSESSMENT Abdominal pain, normal GYN ultrasound. Pain not consistent with a GYN ultrasound Long term intermittent diarrhea, no change, prior  negative colonoscopy Fatigue, normal TSH and CMP, has mild polycythemia Weight loss, now stable, but unable to gain weight Polycythemia  PLAN F/U with her primary, she has an appointment next month She will need a repeat CBC   An After Visit Summary was printed and given to the patient.  Cc: Dr Harlan Stains

## 2015-04-29 ENCOUNTER — Ambulatory Visit
Admission: RE | Admit: 2015-04-29 | Discharge: 2015-04-29 | Disposition: A | Discharge status: Home / Self Care | Payer: Medicare Other | Source: Ambulatory Visit | Attending: Family Medicine | Admitting: Family Medicine

## 2015-05-01 ENCOUNTER — Other Ambulatory Visit: Payer: Self-pay | Admitting: Family Medicine

## 2015-05-01 DIAGNOSIS — R1084 Generalized abdominal pain: Secondary | ICD-10-CM

## 2015-05-13 ENCOUNTER — Ambulatory Visit
Admission: RE | Admit: 2015-05-13 | Discharge: 2015-05-13 | Disposition: A | Discharge status: Home / Self Care | Payer: Medicare Other | Source: Ambulatory Visit | Attending: Family Medicine | Admitting: Family Medicine

## 2015-05-13 DIAGNOSIS — R1084 Generalized abdominal pain: Secondary | ICD-10-CM

## 2015-05-13 MED ORDER — IOPAMIDOL (ISOVUE-300) INJECTION 61%
100.0000 mL | Freq: Once | INTRAVENOUS | Status: AC | PRN
  Administered 2015-05-13: 100 mL via INTRAVENOUS

## 2015-05-17 DIAGNOSIS — C50919 Malignant neoplasm of unspecified site of unspecified female breast: Secondary | ICD-10-CM

## 2015-05-17 DIAGNOSIS — Z923 Personal history of irradiation: Secondary | ICD-10-CM

## 2015-05-17 HISTORY — DX: Malignant neoplasm of unspecified site of unspecified female breast: C50.919

## 2015-05-17 HISTORY — DX: Personal history of irradiation: Z92.3

## 2015-06-03 ENCOUNTER — Ambulatory Visit: Payer: Medicare Other | Admitting: Nurse Practitioner

## 2015-06-12 ENCOUNTER — Ambulatory Visit: Payer: Medicare Other | Admitting: Nurse Practitioner

## 2015-08-19 ENCOUNTER — Encounter: Payer: Self-pay | Admitting: Nurse Practitioner

## 2015-08-19 ENCOUNTER — Ambulatory Visit (INDEPENDENT_AMBULATORY_CARE_PROVIDER_SITE_OTHER): Payer: Medicare Other | Admitting: Nurse Practitioner

## 2015-08-19 VITALS — BP 126/70 | HR 64 | Ht 62.0 in | Wt 117.0 lb

## 2015-08-19 DIAGNOSIS — Z01419 Encounter for gynecological examination (general) (routine) without abnormal findings: Secondary | ICD-10-CM | POA: Diagnosis not present

## 2015-08-19 DIAGNOSIS — Z0000 Encounter for general adult medical examination without abnormal findings: Secondary | ICD-10-CM

## 2015-08-19 DIAGNOSIS — E559 Vitamin D deficiency, unspecified: Secondary | ICD-10-CM | POA: Diagnosis not present

## 2015-08-19 LAB — HEPATITIS C ANTIBODY: HCV AB: NEGATIVE

## 2015-08-19 MED ORDER — ESTRADIOL 10 MCG VA TABS: ORAL_TABLET | VAGINAL | Status: DC

## 2015-08-19 MED ORDER — ERGOCALCIFEROL 1.25 MG (50000 UT) PO CAPS: 50000.0000 [IU] | ORAL_CAPSULE | ORAL | Status: DC

## 2015-08-19 MED ORDER — VALACYCLOVIR HCL 1 G PO TABS: ORAL_TABLET | ORAL | Status: DC

## 2015-08-19 NOTE — Patient Instructions (Signed)

## 2015-08-19 NOTE — Progress Notes (Signed)
Patient ID: Paula Dawson, female   DOB: 1946/02/12, 70 y.o.   MRN: [id number]  70 y.o. G2P0001 Divorced  Caucasian Fe here for annual exam.  Went back on Prolia in January and plans to repeat in July. Not dating or SA.  Patient's last menstrual period was 05/16/2000 (approximate).          Sexually active: No.  The current method of family planning is post menopausal status.    Exercising: Yes.    walking 4-5 X a week.  Some upper body weights. Smoker:  no  Health Maintenance: Pap:05/29/14, Negative with neg HR HPV  MMG:12/31/14, Bi-Rads 1: Negative Colonoscopy: 04/08/13, repeat in 5 years BMD:04/29/15 T Score, -2.9 Spine / -2.3 Left Femur Neck TDaP: 02/08/11 Shingles: 11/2009 Pneumonia: 2015, Prevnar 13 - 2016 Hep C and HIV:  done today Labs: Dr. Dema Severin takes care of all labs   reports that she has never smoked. She has never used smokeless tobacco. She reports that she does not drink alcohol or use illicit drugs.  Past Medical History  Diagnosis Date  . Osteopenia   . Anemia   . HSV infection     pos I and II  . Osteoporosis 1998    Off Fosomax 2008, Prolia 03/17/11, 09/14/11, 03/16/12  . Thyroid disease age 3    hypo  . Arthritis   . Dry eye syndrome   . Macular degeneration of both eyes     Past Surgical History  Procedure Laterality Date  . Tonsillectomy and adenoidectomy    . Breast implant removal  1982  . Elbow surgery Left 12/1997  . Foot neuroma surgery Left 2004?  Marland Kitchen Augmentation mammaplasty  1981  . Tubal ligation  1981  . Colonoscopy  10/04    Dr Watt Climes  . Colonoscopy  04/07/08    normal - Dr. Watt Climes  . Esophagogastroduodenoscopy endoscopy  08/2012    [place] hernia with GERD    Current Outpatient Prescriptions  Medication Sig Dispense Refill  . Ascorbic Acid Buffered (BUFFERED VITAMIN C PO) Take 750 mg by mouth daily. Buffered Vitamin C.    . B Complex Vitamins (B COMPLEX 100 PO) Take 1 tablet by mouth daily.    . Calcium Carbonate-Vitamin D  (CALCIUM + D PO) Take 500 mg by mouth 2 (two) times daily.     . carboxymethylcellulose (RETAINE CMC) 0.5 % SOLN Place 1 drop into both eyes 2 (two) times daily as needed. Retaine Eye Drops.    Marland Kitchen denosumab (PROLIA) 60 MG/ML SOLN injection Inject 60 mg into the skin every 6 (six) months. Administer in upper arm, thigh, or abdomen    . ergocalciferol (VITAMIN D2) 50000 units capsule Take 1 capsule (50,000 Units total) by mouth once a week. 30 capsule 3  . Ferrous Sulfate (IRON) 28 MG TABS Take 1 tablet by mouth every Monday, Wednesday, and Friday.    . Flaxseed, Linseed, (FLAX SEED OIL PO) Take by mouth.    . levothyroxine (SYNTHROID, LEVOTHROID) 88 MCG tablet Take 88 mcg by mouth daily before breakfast.    . Multiple Vitamin (MULTIVITAMIN) tablet Take 1 tablet by mouth daily.    . Multiple Vitamins-Minerals (ICAPS AREDS 2) CAPS Take 2 capsules by mouth daily.    . ranitidine (ZANTAC) 150 MG capsule Take 150 mg by mouth 2 (two) times daily.    . valACYclovir (VALTREX) 1000 MG tablet TAKE 1 TABLET DAILY 90 tablet 3  . XIIDRA 5 % SOLN Place 1 drop into both eyes  2 (two) times daily.    . Estradiol (VAGIFEM) 10 MCG TABS vaginal tablet Use twice a week as directed 24 tablet 4   No current facility-administered medications for this visit.    Family History  Problem Relation Age of Onset  . Heart failure Mother   . Prostate cancer Father 50  . Breast cancer Sister 71    recurrence 20 yrs later  . Colon polyps Sister   . Breast cancer Sister 37  . Colon polyps Sister   . Breast cancer Sister 69  . Breast cancer Sister 39  . Ovarian cancer Cousin     died at 90  . Breast cancer Other     X 4 + age 46, 68 with recurrence, 76, 50    ROS:  Pertinent items are noted in HPI.  Otherwise, a comprehensive ROS was negative.  Exam:   BP 126/70 mmHg  Pulse 64  Ht 5\' 2"  (1.575 m)  Wt 117 lb (53.071 kg)  BMI 21.39 kg/m2  LMP 05/16/2000 (Approximate) Height: 5\' 2"  (157.5 cm) Ht Readings from Last  3 Encounters:  08/19/15 5\' 2"  (1.575 m)  05/29/14 5' 2.25" (1.581 m)  03/19/13 5' 2.25" (1.581 m)    General appearance: alert, cooperative and appears stated age Head: Normocephalic, without obvious abnormality, atraumatic Neck: no adenopathy, supple, symmetrical, trachea midline and thyroid normal to inspection and palpation Lungs: clear to auscultation bilaterally Breasts: normal appearance, no masses or tenderness Heart: regular rate and rhythm Abdomen: soft, non-tender; no masses,  no organomegaly Extremities: extremities normal, atraumatic, no cyanosis or edema Skin: Skin color, texture, turgor normal. No rashes or lesions Lymph nodes: Cervical, supraclavicular, and axillary nodes normal. No abnormal inguinal nodes palpated Neurologic: Grossly normal   Pelvic: External genitalia:  no lesions              Urethra:  normal appearing urethra with no masses, tenderness or lesions              Bartholin's and Skene's: normal                 Vagina: normal appearing vagina with normal color and discharge, no lesions              Cervix: anteverted              Pap taken: No. Bimanual Exam:  Uterus:  normal size, contour, position, consistency, mobility, non-tender              Adnexa: no mass, fullness, tenderness               Rectovaginal: Confirms               Anus:  normal sphincter tone, no lesions  Chaperone present: no  A:  Well Woman with normal exam  Postmenopausal HRT 1997-11/02 [facility] Atrophic vaginitis wants to change from Estring to Vagifem secondary to insurance coverage History of HSV I /II History of Vit D deficiency Osteoporosis - off Fosamax, took Prolia X 3 doses - now back on since January    P:   Reviewed health and wellness pertinent to exam  Pap smear as above  Mammogram is due 12/2015  Refill on Vagifem for a year aware of our concerns of [facility]  Counseled with risk of  CVA, DVT, cancer, etc  Refill Vit D and follow with labs  Counseled on breast self exam, mammography screening, use and side effects of HRT, adequate  intake of calcium and vitamin D, diet and exercise return annually or prn  An After Visit Summary was printed and given to the patient.

## 2015-08-20 LAB — VITAMIN D 25 HYDROXY (VIT D DEFICIENCY, FRACTURES): Vit D, 25-Hydroxy: 41 ng/mL (ref 30–100)

## 2015-08-21 ENCOUNTER — Ambulatory Visit: Payer: Medicare Other | Admitting: Nurse Practitioner

## 2015-08-23 NOTE — Progress Notes (Signed)
Reviewed personally.  M. Suzanne Kwinton Maahs, MD.  

## 2015-10-29 ENCOUNTER — Other Ambulatory Visit: Payer: Self-pay | Admitting: Family Medicine

## 2015-10-29 DIAGNOSIS — R911 Solitary pulmonary nodule: Secondary | ICD-10-CM

## 2015-11-11 ENCOUNTER — Other Ambulatory Visit: Payer: Medicare Other

## 2015-11-13 ENCOUNTER — Ambulatory Visit
Admission: RE | Admit: 2015-11-13 | Discharge: 2015-11-13 | Disposition: A | Discharge status: Home / Self Care | Payer: Medicare Other | Source: Ambulatory Visit | Attending: Family Medicine | Admitting: Family Medicine

## 2015-11-13 DIAGNOSIS — R911 Solitary pulmonary nodule: Secondary | ICD-10-CM

## 2015-11-13 HISTORY — DX: Solitary pulmonary nodule: R91.1

## 2015-12-02 ENCOUNTER — Other Ambulatory Visit: Payer: Self-pay | Admitting: Family Medicine

## 2015-12-02 DIAGNOSIS — Z1231 Encounter for screening mammogram for malignant neoplasm of breast: Secondary | ICD-10-CM

## 2015-12-11 ENCOUNTER — Telehealth: Payer: Self-pay | Admitting: Internal Medicine

## 2015-12-11 ENCOUNTER — Ambulatory Visit (INDEPENDENT_AMBULATORY_CARE_PROVIDER_SITE_OTHER): Payer: Medicare Other | Admitting: Internal Medicine

## 2015-12-11 ENCOUNTER — Encounter: Payer: Self-pay | Admitting: Internal Medicine

## 2015-12-11 VITALS — BP 90/60 | HR 68 | Ht 62.0 in | Wt 113.2 lb

## 2015-12-11 DIAGNOSIS — J479 Bronchiectasis, uncomplicated: Secondary | ICD-10-CM | POA: Diagnosis not present

## 2015-12-11 NOTE — Telephone Encounter (Signed)
Spoke with pt, clarified questions about using coconut oil topically and her GERD.  All questions were answered, pt expressed understanding.  Nothing further needed.

## 2015-12-11 NOTE — Patient Instructions (Addendum)
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    It can be treated with medication, but also with lifestyle changes including elevation of the head of your bed (ideally with 6 inch  bed blocks),  Smoking cessation, avoidance of late meals, excessive alcohol, and avoid fatty foods, chocolate, peppermint, colas, red wine, and acidic juices such as orange juice.  NO MINT OR MENTHOL PRODUCTS SO NO COUGH DROPS  USE SUGARLESS CANDY INSTEAD (Jolley ranchers or Stover's or Life Savers) or even ice chips will also do - the key is to swallow to prevent all throat clearing. NO OIL BASED VITAMINS - use powdered substitutes.    Please schedule a follow up visit in 3 months but call sooner if needed with cxr

## 2015-12-11 NOTE — Progress Notes (Signed)
Subjective:    Patient ID: Paula Dawson, female    DOB: 10-18-1945,    MRN: [id number]  HPI  79 yowf  Never smoker with unexplained wt loss/ lower abd pain > ct Abd 05/12/16  > CT chest 11/13/15 c/w bronchiectasis so referred to pulmonary clinic 12/11/2015 by Dr Harlan Stains   12/11/2015 1st [facility] office visit/ Mekhi Sonn   Chief Complaint  Patient presents with  . Pulmonary Consult    Referred by Dr. Caren Griffins white for eval of pulmonary nodules. Pt c/o non prod cough for 3 days.   pt was told sickly as child #10/10 children and remembers a lot of infections as child and occ visits to the doctor but never admitted and by hs better but only "avg athlete" in terms of aerobic activities with no pna as adult that she can recall and minimal tendency to cough or am excess/ purulent mucus and no h/o hemoptysis  She says the abd pain/ wt loss the resulted in the ct abd /dx of bronchiectasis have resolved though no dx rendered.  Some intermittent nasal congestion intermittently s classic seasonal pattern  Not limited by breathing from desired activities    No obvious other patterns in day to day or daytime variabilty or  cp or chest tightness, subjective wheeze or currently active  sinus  symptoms. No unusual exp hx or h/o childhood / asthma or knowledge of premature birth.  Sleeping ok without nocturnal  or early am exacerbation  of respiratory  c/o's or need for noct saba. Also denies any obvious fluctuation of symptoms with weather or environmental changes or other aggravating or alleviating factors except as outlined above   Current Medications, Allergies, Complete Past Medical History, Past Surgical History, Family History, and Social History were reviewed in [employer] record.                 Review of Systems  Constitutional: Positive for unexpected weight change.  HENT: Positive for congestion. Negative for ear pain, postnasal drip, rhinorrhea,  sinus pressure, sore throat, trouble swallowing and voice change.   Eyes: Negative.   Respiratory: Positive for cough. Negative for apnea, choking, chest tightness, shortness of breath, wheezing and stridor.   Cardiovascular: Negative.  Negative for chest pain, palpitations and leg swelling.  Gastrointestinal: Negative.  Negative for abdominal distention, abdominal pain, nausea and vomiting.  Genitourinary: Negative.        Acid heartburn  Musculoskeletal: Negative.  Negative for arthralgias and myalgias.  Skin: Negative.  Negative for rash.  Allergic/Immunologic: Negative.  Negative for environmental allergies and food allergies.  Neurological: Negative.  Negative for dizziness, syncope, weakness and headaches.  Hematological: Negative.  Negative for adenopathy. Does not bruise/bleed easily.  Psychiatric/Behavioral: Negative.  Negative for agitation and sleep disturbance. The patient is not nervous/anxious.        Objective:   Physical Exam  amb wf nad  Wt Readings from Last 3 Encounters:  12/11/15 113 lb 3.2 oz (51.3 kg)  08/19/15 117 lb (53.1 kg)  04/21/15 116 lb (52.6 kg)    Vital signs reviewed  HEENT: nl dentition, turbinates, and oropharynx. Nl external ear canals without cough reflex   NECK :  without JVD/Nodes/TM/ nl carotid upstrokes bilaterally   LUNGS: no acc muscle use,  Nl contour chest which is clear to A and P bilaterally without cough on insp or exp maneuvers   CV:  RRR  no s3 or murmur or increase in P2, no  edema   ABD:  soft and nontender with nl inspiratory excursion in the supine position. No bruits or organomegaly, bowel sounds nl  MS:  Nl gait/ ext warm without deformities, calf tenderness, cyanosis or clubbing No obvious joint restrictions   SKIN: warm and dry without lesions    NEURO:  alert, approp, nl sensorium with  no motor deficits    12/24/2002 atx changes RML on abd ct    I personally reviewed images and agree with radiology  impression as follows:  CT Chest   11/13/15 1. 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 2. 10 to 11 mm subpleural pulmonary nodules in the right middle and right upper lobes are likely related to #1, but recommend six-month follow-up noncontrast CT to exclude superimposed growing nodule.        Assessment & Plan:

## 2015-12-12 NOTE — Assessment & Plan Note (Signed)
This is an extremely common benign condition we see all the time in women >> women over age of 29  and does not warrant aggressive eval/ rx at this point unless there is a clinical correlation suggesting unaddressed pulmonary infection (purulent sputum, night sweats, unintended wt loss, doe) or evolution of  obvious changes on plain cxr (as opposed to serial CT, which is way over sensitive to make clinical decisions re intervention and treatment in this population who tend to tolerate both dx and treatment poorly) .   In a never smoker I assured her the likelihood that this is any form of malignancy Is extremely low.  Discussed in detail all the  indications, usual  risks and alternatives  relative to the benefits with patient who agrees to proceed with conservative f/u as outlined  Also reviewed pathophysiology/ etiology of bronchiectasis (childhood pna likely by her hx) . For now no need for fob or rx other than to keep up with immunizations under Dr Orest Dikes direction and call if mucus turns purulent or bloody   Total time devoted to counseling  = 35/63m review case with pt/ discussion of options/alternatives/ personally creating written instructions  in presence of pt  then going over those specific  Instructions directly with the pt including how to use all of the meds but in particular covering each new medication in detail and the difference between the maintenance/automatic meds and the prns using an action plan format for the latter.    rec f/u in 3 months

## 2015-12-15 DIAGNOSIS — C50919 Malignant neoplasm of unspecified site of unspecified female breast: Secondary | ICD-10-CM

## 2015-12-15 HISTORY — DX: Malignant neoplasm of unspecified site of unspecified female breast: C50.919

## 2016-01-01 ENCOUNTER — Ambulatory Visit: Payer: Medicare Other

## 2016-01-01 ENCOUNTER — Ambulatory Visit: Payer: Medicare Other | Admitting: Adult Health

## 2016-01-06 ENCOUNTER — Ambulatory Visit
Admission: RE | Admit: 2016-01-06 | Discharge: 2016-01-06 | Disposition: A | Discharge status: Home / Self Care | Payer: Medicare Other | Source: Ambulatory Visit | Attending: Family Medicine | Admitting: Family Medicine

## 2016-01-06 DIAGNOSIS — Z1231 Encounter for screening mammogram for malignant neoplasm of breast: Secondary | ICD-10-CM

## 2016-01-07 ENCOUNTER — Other Ambulatory Visit: Payer: Self-pay | Admitting: Family Medicine

## 2016-01-07 DIAGNOSIS — R928 Other abnormal and inconclusive findings on diagnostic imaging of breast: Secondary | ICD-10-CM

## 2016-01-13 ENCOUNTER — Ambulatory Visit
Admission: RE | Admit: 2016-01-13 | Discharge: 2016-01-13 | Disposition: A | Discharge status: Home / Self Care | Payer: Medicare Other | Source: Ambulatory Visit | Attending: Family Medicine | Admitting: Family Medicine

## 2016-01-13 ENCOUNTER — Other Ambulatory Visit: Payer: Self-pay | Admitting: Family Medicine

## 2016-01-13 DIAGNOSIS — N6320 Unspecified lump in the left breast, unspecified quadrant: Secondary | ICD-10-CM

## 2016-01-13 DIAGNOSIS — R928 Other abnormal and inconclusive findings on diagnostic imaging of breast: Secondary | ICD-10-CM

## 2016-01-15 ENCOUNTER — Ambulatory Visit
Admission: RE | Admit: 2016-01-15 | Discharge: 2016-01-15 | Disposition: A | Discharge status: Home / Self Care | Payer: Medicare Other | Source: Ambulatory Visit | Attending: Family Medicine | Admitting: Family Medicine

## 2016-01-15 ENCOUNTER — Other Ambulatory Visit: Payer: Self-pay | Admitting: Family Medicine

## 2016-01-15 DIAGNOSIS — N6320 Unspecified lump in the left breast, unspecified quadrant: Secondary | ICD-10-CM

## 2016-01-20 ENCOUNTER — Telehealth: Payer: Self-pay | Admitting: *Deleted

## 2016-01-20 DIAGNOSIS — C50412 Malignant neoplasm of upper-outer quadrant of left female breast: Secondary | ICD-10-CM

## 2016-01-20 NOTE — Telephone Encounter (Signed)
Confirmed [facility] for 01/27/16 at 1215pm .  Instructions and contact information given.

## 2016-01-27 ENCOUNTER — Ambulatory Visit (HOSPITAL_BASED_OUTPATIENT_CLINIC_OR_DEPARTMENT_OTHER): Payer: Medicare Other | Admitting: Oncology

## 2016-01-27 ENCOUNTER — Encounter: Payer: Self-pay | Admitting: Oncology

## 2016-01-27 ENCOUNTER — Ambulatory Visit: Payer: Medicare Other | Attending: General Surgery | Admitting: Physical Therapy

## 2016-01-27 ENCOUNTER — Ambulatory Visit
Admission: RE | Admit: 2016-01-27 | Discharge: 2016-01-27 | Disposition: A | Discharge status: Home / Self Care | Payer: Medicare Other | Source: Ambulatory Visit | Attending: Radiation Oncology | Admitting: Radiation Oncology

## 2016-01-27 ENCOUNTER — Other Ambulatory Visit: Payer: Self-pay | Admitting: General Surgery

## 2016-01-27 ENCOUNTER — Encounter: Payer: Self-pay | Admitting: Physical Therapy

## 2016-01-27 ENCOUNTER — Encounter: Payer: Self-pay | Admitting: General Practice

## 2016-01-27 ENCOUNTER — Other Ambulatory Visit (HOSPITAL_BASED_OUTPATIENT_CLINIC_OR_DEPARTMENT_OTHER): Payer: Medicare Other

## 2016-01-27 VITALS — BP 141/80 | HR 79 | Temp 98.3°F | Resp 18 | Ht 62.0 in | Wt 111.2 lb

## 2016-01-27 DIAGNOSIS — D0512 Intraductal carcinoma in situ of left breast: Secondary | ICD-10-CM

## 2016-01-27 DIAGNOSIS — C50412 Malignant neoplasm of upper-outer quadrant of left female breast: Secondary | ICD-10-CM

## 2016-01-27 DIAGNOSIS — Z171 Estrogen receptor negative status [ER-]: Secondary | ICD-10-CM

## 2016-01-27 DIAGNOSIS — R293 Abnormal posture: Secondary | ICD-10-CM

## 2016-01-27 LAB — COMPREHENSIVE METABOLIC PANEL
ALT: 21 U/L (ref 0–55)
ANION GAP: 6 meq/L (ref 3–11)
AST: 21 U/L (ref 5–34)
Albumin: 3.6 g/dL (ref 3.5–5.0)
Alkaline Phosphatase: 93 U/L (ref 40–150)
BILIRUBIN TOTAL: 0.36 mg/dL (ref 0.20–1.20)
BUN: 14.8 mg/dL (ref 7.0–26.0)
CHLORIDE: 108 meq/L (ref 98–109)
CO2: 27 meq/L (ref 22–29)
Calcium: 9.1 mg/dL (ref 8.4–10.4)
Creatinine: 0.8 mg/dL (ref 0.6–1.1)
EGFR: 75 mL/min/{1.73_m2} — AB (ref >90)
GLUCOSE: 131 mg/dL (ref 70–140)
POTASSIUM: 3.6 meq/L (ref 3.5–5.1)
SODIUM: 142 meq/L (ref 136–145)
Total Protein: 6.6 g/dL (ref 6.4–8.3)

## 2016-01-27 LAB — CBC WITH DIFFERENTIAL/PLATELET
BASO%: 0.4 % (ref 0.0–2.0)
BASOS ABS: 0 10*3/uL (ref 0.0–0.1)
EOS ABS: 0.1 10*3/uL (ref 0.0–0.5)
EOS%: 0.8 % (ref 0.0–7.0)
HCT: 46.1 % (ref 34.8–46.6)
HGB: 15.3 g/dL (ref 11.6–15.9)
LYMPH%: 9.6 % — ABNORMAL LOW (ref 14.0–49.7)
MCH: 31.9 pg (ref 25.1–34.0)
MCHC: 33.2 g/dL (ref 31.5–36.0)
MCV: 96.2 fL (ref 79.5–101.0)
MONO#: 0.8 10*3/uL (ref 0.1–0.9)
MONO%: 8.8 % (ref 0.0–14.0)
NEUT%: 80.4 % — AB (ref 38.4–76.8)
NEUTROS ABS: 7 10*3/uL — AB (ref 1.5–6.5)
PLATELETS: 233 10*3/uL (ref 145–400)
RBC: 4.79 10*6/uL (ref 3.70–5.45)
RDW: 13.5 % (ref 11.2–14.5)
WBC: 8.8 10*3/uL (ref 3.9–10.3)
lymph#: 0.8 10*3/uL — ABNORMAL LOW (ref 0.9–3.3)

## 2016-01-27 NOTE — Therapy (Signed)
[place], [place], [address] Phone: [phone]   Fax:  [phone]  Physical Therapy Evaluation  Patient Details  Name: Paula Dawson MRN: [id number] Date of Birth: [birth year] Referring Provider: Dr. [name]  Encounter Date: 01/27/2016      PT End of Session - 01/27/16 1557    Visit Number 1   Number of Visits 1   PT Start Time 2585   PT Stop Time 1543   PT Time Calculation (min) 28 min   Activity Tolerance Patient tolerated treatment well   Behavior During Therapy [facility] for tasks assessed/performed      Past Medical History:  Diagnosis Date  . Anemia   . Arthritis   . Dry eye syndrome   . HSV infection    pos I and II  . Macular degeneration of both eyes   . Osteopenia   . Osteoporosis 1998   Off Fosomax 2008, Prolia 03/17/11, 09/14/11, 03/16/12  . Thyroid disease age 38   hypo    Past Surgical History:  Procedure Laterality Date  . AUGMENTATION MAMMAPLASTY  1981  . BREAST IMPLANT REMOVAL  1982  . COLONOSCOPY  10/04   Dr Watt Climes  . COLONOSCOPY  04/07/08   normal - Dr. Watt Climes  . ELBOW SURGERY Left 12/1997  . ESOPHAGOGASTRODUODENOSCOPY ENDOSCOPY  08/2012   [place] hernia with GERD  . FOOT NEUROMA SURGERY Left 2004?  . TONSILLECTOMY AND ADENOIDECTOMY    . TUBAL LIGATION  1981    There were no vitals filed for this visit.       Subjective Assessment - 01/27/16 1546    Subjective Patient reports she is here today to be seen for her newly diagnosed left breast cancer.   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  It is located in the upper outer quadrant and measures 1.4 cm.  It is ER negative and PR positive.   Patient Stated Goals Reduce lymphedema risk and learn post op shoulder ROM HEP   Currently in Pain? No/denies            [facility] PT  Assessment - 01/27/16 0001      Assessment   Medical Diagnosis Left breast cancer   Referring Provider Dr. [name]   Onset Date/Surgical Date 01/06/16   Hand Dominance Left   Prior Therapy none     Precautions   Precautions Other (comment)   Precaution Comments active cancer     Restrictions   Weight Bearing Restrictions No     Balance Screen   Has the patient fallen in the past 6 months No   Has the patient had a decrease in activity level because of a fear of falling?  No   Is the patient reluctant to leave their home because of a fear of falling?  No     Home Environment   Living Environment Private residence   Living Arrangements Alone   Available Help at Discharge Family     Prior Function   Level of [place] Retired   Leisure She walks 3x/week for 30 minutes     Cognition   Overall Cognitive Status Within Functional Limits for tasks assessed     Posture/Postural Control   Posture/Postural Control Postural limitations   Postural Limitations Rounded Shoulders;Forward head     ROM / Strength  AROM / PROM / Strength AROM;Strength     AROM   AROM Assessment Site Shoulder;Cervical   Right/Left Shoulder Right;Left   Right Shoulder Extension 50 Degrees   Right Shoulder Flexion 152 Degrees   Right Shoulder ABduction 163 Degrees   Right Shoulder Internal Rotation 67 Degrees   Right Shoulder External Rotation 90 Degrees   Left Shoulder Extension 60 Degrees   Left Shoulder Flexion 160 Degrees   Left Shoulder ABduction 160 Degrees   Left Shoulder Internal Rotation 82 Degrees   Left Shoulder External Rotation 83 Degrees   Cervical Flexion WNL   Cervical Extension WNL   Cervical - Right Side Bend WNL   Cervical - Left Side Bend WNL   Cervical - Right Rotation WNL   Cervical - Left Rotation WNL     Strength   Overall Strength Within functional limits for tasks performed           LYMPHEDEMA/ONCOLOGY QUESTIONNAIRE - 01/27/16  1553      Type   Cancer Type Left breast cancer     Lymphedema Assessments   Lymphedema Assessments Upper extremities     Right Upper Extremity Lymphedema   10 cm Proximal to Olecranon Process 24.4 cm   Olecranon Process 22.8 cm   10 cm Proximal to Ulnar Styloid Process 19.4 cm   Just Proximal to Ulnar Styloid Process 14 cm   Across Hand at [employer] 17.8 cm   At [place] of 2nd Digit 5.7 cm     Left Upper Extremity Lymphedema   10 cm Proximal to Olecranon Process 25 cm   Olecranon Process 22.5 cm   10 cm Proximal to Ulnar Styloid Process 18.3 cm   Just Proximal to Ulnar Styloid Process 13.8 cm   Across Hand at [employer] 17.2 cm   At [place] of 2nd Digit 5.5 cm       Patient was instructed today in a home exercise program today for post op shoulder range of motion. These included active assist shoulder flexion in sitting, scapular retraction, wall walking with shoulder abduction, and hands behind head external rotation.  She was encouraged to do these twice a day, holding 3 seconds and repeating 5 times when permitted by her physician.          PT Education - 01/27/16 1556    Education provided Yes   Education Details Lymphedema risk reduction and post op shoulder ROM HEP   Person(s) Educated Patient   Methods Explanation;Demonstration;Handout   Comprehension Returned demonstration;Verbalized understanding              Breast Clinic Goals - 01/27/16 1602      Patient will be able to verbalize understanding of pertinent lymphedema risk reduction practices relevant to her diagnosis specifically related to skin care.   Time 1   Period Days   Status Achieved     Patient will be able to return demonstrate and/or verbalize understanding of the post-op home exercise program related to regaining shoulder range of motion.   Time 1   Period Days   Status Achieved     Patient will be able to verbalize understanding of the importance of attending the  postoperative After Breast Cancer Class for further lymphedema risk reduction education and therapeutic exercise.   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  It is located in the upper outer quadrant and measures 1.4 cm.  It is ER negative and PR positive.  Her multidisciplinary team met prior to her assessments to determine a recommended treatment plan.  She is planning to have a left lumpectomy and sentinel node biopsy followed by radiation and anti-estrogen therapy.  She may benefit from post op PT to regain shoulder ROM and reduce lymphedema risk.   Rehab Potential Excellent   Clinical Impairments Affecting Rehab Potential none   PT Frequency One time visit   PT Treatment/Interventions Patient/family education;Therapeutic exercise   PT Next Visit Plan Will f/u after surgery to determine needs   PT Home Exercise Plan Post op shoulder ROM HEP   Consulted and Agree with Plan of Care Patient;Family member/caregiver   Family Member Consulted Friend and son      Patient will benefit from skilled therapeutic intervention in order to improve the following deficits and impairments:  Postural dysfunction, Decreased knowledge of precautions, Pain, Impaired UE functional use, Decreased range of motion  Visit Diagnosis: Breast cancer of upper-outer quadrant of left female breast ([place]) - Plan: PT plan of care cert/re-cert  Abnormal posture - Plan: PT plan of care cert/re-cert  Patient will follow up at outpatient cancer rehab if needed following surgery.  If the patient requires physical therapy at that time, a specific plan will be dictated and sent to the referring physician for approval. The patient was educated today on appropriate basic range of motion exercises to begin post operatively and the importance of  attending the After Breast Cancer class following surgery.  Patient was educated today on lymphedema risk reduction practices as it pertains to recommendations that will benefit the patient immediately following surgery.  She verbalized good understanding.  No additional physical therapy is indicated at this time.        G-Codes - Feb 11, 2016 1603    Functional Assessment Tool Used Clinical Judgement   Functional Limitation Other PT primary   Other PT Primary Current Status (A1937) At least 1 percent but less than 20 percent impaired, limited or restricted   Other PT Primary Goal Status (T0240) At least 1 percent but less than 20 percent impaired, limited or restricted   Other PT Primary Discharge Status (X7353) At least 1 percent but less than 20 percent impaired, limited or restricted       Problem List Patient Active Problem List   Diagnosis Date Noted  . Breast cancer of upper-outer quadrant of left female breast ([place]) 01/20/2016  . Bronchiectasis without acute exacerbation ([place]) 12/11/2015   Annia Friendly, PT 11-Feb-2016 4:05 PM  [place], [place], [address] Phone: [phone]   Fax:  [phone]  Name: Paula Dawson MRN: [id number] Date of Birth: 1945/11/09

## 2016-01-27 NOTE — Progress Notes (Signed)
Radiation Oncology         ([phone]) [phone] ________________________________  Name: Paula Dawson MRN: [id number]  Date: 01/27/2016  DOB: 10-23-1945  WS:[id number] S, MD  Harlan Stains, MD     REFERRING PHYSICIAN: Harlan Stains, MD   DIAGNOSIS: The encounter diagnosis was Breast cancer of upper-outer quadrant of left female breast ([place]).   HISTORY OF PRESENT ILLNESS: Paula Dawson is a 70 y.o. female seen in the multidisciplinary breast clinic for a new diagnosis of left breast cancer. The patient had a screening mammogram which revealed calcifications. An ultrasound revelaed a 1.4 cm lesion. Her axillary nodes were negative. 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 She comes today to discuss the options for treatment and with Dr. Lisbeth Renshaw to discuss options for radiation.    PREVIOUS RADIATION THERAPY: No   PAST MEDICAL HISTORY:  Past Medical History:  Diagnosis Date  . Anemia   . Arthritis   . Dry eye syndrome   . HSV infection    pos I and II  . Macular degeneration of both eyes   . Osteopenia   . Osteoporosis 1998   Off Fosomax 2008, Prolia 03/17/11, 09/14/11, 03/16/12  . Thyroid disease age 38   hypo       PAST SURGICAL HISTORY: Past Surgical History:  Procedure Laterality Date  . AUGMENTATION MAMMAPLASTY  1981  . BREAST IMPLANT REMOVAL  1982  . COLONOSCOPY  10/04   Dr Watt Climes  . COLONOSCOPY  04/07/08   normal - Dr. Watt Climes  . ELBOW SURGERY Left 12/1997  . ESOPHAGOGASTRODUODENOSCOPY ENDOSCOPY  08/2012   [place] hernia with GERD  . FOOT NEUROMA SURGERY Left 2004?  . TONSILLECTOMY AND ADENOIDECTOMY    . TUBAL LIGATION  1981     FAMILY HISTORY:  Family History  Problem Relation Age of Onset  . Heart failure Mother   . Prostate cancer Father 37  . Liver cancer Father   . Colon polyps Sister   . Breast cancer Sister 85  . Breast cancer Sister 16  . Ovarian cancer Cousin     died at 79  . Breast  cancer Other     X 4 + age 79, 98 with recurrence, 46, 77  . Breast cancer Sister 47    recurrence 20 yrs later  . Colon polyps Sister   . Breast cancer Sister 24  . Lung cancer Brother     smoked     SOCIAL HISTORY:  reports that she has never smoked. She has never used smokeless tobacco. She reports that she does not drink alcohol or use drugs. The patient is divorced. She resides in [place], and is accompanied by her friend and son.   ALLERGIES: Review of patient's allergies indicates no known allergies.   MEDICATIONS:  Current Outpatient Prescriptions  Medication Sig Dispense Refill  . Ascorbic Acid Buffered (BUFFERED VITAMIN C PO) Take 750 mg by mouth.    . B Complex-Folic Acid (B COMPLEX-VITAMIN B12 PO) Take 750 mg by mouth daily.    Marland Kitchen BABY ASPIRIN PO Take 81 mg by mouth daily.    . carboxymethylcellulose (REFRESH PLUS) 0.5 % SOLN Place 1 drop into both eyes 3 (three) times daily as needed.    Marland Kitchen denosumab (PROLIA) 60 MG/ML SOLN injection Inject 60 mg into the skin every 6 (six) months. Administer in upper arm, thigh, or abdomen    . Estradiol 10 MCG TABS vaginal tablet Place 1 tablet  vaginally 2 (two) times a week.    . Multiple Vitamins-Minerals (MULTIVITAMIN WOMEN 50+) TABS Take 1 tablet by mouth daily.    Marland Kitchen OVER THE COUNTER MEDICATION Take 2 tablets by mouth daily. Areds 2    . ranitidine (ZANTAC) 150 MG capsule Take 150 mg by mouth 2 (two) times daily.    . RESTASIS 0.05 % ophthalmic emulsion Place 1 drop into both eyes daily.    Marland Kitchen SYNTHROID 88 MCG tablet Take 1 tablet by mouth daily.    . valACYclovir (VALTREX) 1000 MG tablet Take 500 mg by mouth daily.    . Vitamin D, Ergocalciferol, (DRISDOL) 50000 units CAPS capsule Take 1 capsule by mouth every 14 (fourteen) days.     No current facility-administered medications for this encounter.      REVIEW OF SYSTEMS: On review of systems, the patient reports that she is doing well overall. She denies any chest pain,  shortness of breath, cough, fevers, chills, night sweats, unintended weight changes. She denies any bowel or bladder disturbances, and denies abdominal pain, nausea or vomiting. She denies any new musculoskeletal or joint aches or pains. A complete review of systems is obtained and is otherwise negative.     PHYSICAL EXAM:   In general this is a well appearing Caucasian female in no acute distress. She is alert and oriented x4 and appropriate throughout the examination. HEENT reveals that the patient is normocephalic, atraumatic. EOMs are intact. PERRLA. Skin is intact without any evidence of gross lesions. Cardiovascular exam reveals a regular rate and rhythm, no clicks rubs or murmurs are auscultated. Chest is clear to auscultation bilaterally. Lymphatic assessment is performed and does not reveal any adenopathy in the cervical, supraclavicular, axillary, or inguinal chains. Bilateral breasts are examined. There are post biopsy changes of the left breast. No palpable mass is otherwise noted, nor is nipple bleeding or discharge noted of either breast. Abdomen has active bowel sounds in all quadrants and is intact. The abdomen is soft, non tender, non distended. Lower extremities are negative for pretibial pitting edema, deep calf tenderness, cyanosis or clubbing.   ECOG = 0  0 - Asymptomatic (Fully active, able to carry on all predisease activities without restriction)  1 - Symptomatic but completely ambulatory (Restricted in physically strenuous activity but ambulatory and able to carry out work of a light or sedentary nature. For example, light housework, office work)  2 - Symptomatic, <50% in bed during the day (Ambulatory and capable of all self care but unable to carry out any work activities. Up and about more than 50% of waking hours)  3 - Symptomatic, >50% in bed, but not bedbound (Capable of only limited self-care, confined to bed or chair 50% or more of waking hours)  4 - Bedbound  (Completely disabled. Cannot carry on any self-care. Totally confined to bed or chair)  5 - Death   Eustace Pen MM, Creech RH, Tormey DC, et al. [phone]). "Toxicity and response criteria of the [facility]". [place] Oncol. 5 (6): 649-55    LABORATORY DATA:  Lab Results  Component Value Date   WBC 8.8 01/27/2016   HGB 15.3 01/27/2016   HCT 46.1 01/27/2016   MCV 96.2 01/27/2016   PLT 233 01/27/2016   Lab Results  Component Value Date   NA 142 01/27/2016   K 3.6 01/27/2016   CL 103 04/06/2015   CO2 27 01/27/2016   Lab Results  Component Value Date   ALT 21 01/27/2016  AST 21 01/27/2016   ALKPHOS 93 01/27/2016   BILITOT 0.36 01/27/2016      RADIOGRAPHY: Mm Digital Diagnostic Unilat L  Result Date: 01/15/2016 CLINICAL DATA:  Post biopsy mammogram of the left breast for clip placement. EXAM: DIAGNOSTIC LEFT MAMMOGRAM POST ULTRASOUND BIOPSY COMPARISON:  Previous exam(s). FINDINGS: Mammographic images were obtained following ultrasound guided biopsy of left breast mass at 2 o'clock. The coil shaped biopsy marking clip is identified on the true lateral view in the upper-outer quadrant at the expected location. Due to the far posterior position of the lesion, this could not be included on the CC view. IMPRESSION: Appropriate positioning of the coil shaped biopsy marking clip in the upper-outer quadrant of the left breast. Final Assessment: Post Procedure Mammograms for Marker Placement Electronically Signed   By: Ammie Ferrier M.D.   On: 01/15/2016 13:53   US Breast Ltd Uni Left Inc Axilla  Result Date: 01/13/2016 CLINICAL DATA:  Screening recall for possible left breast mass. EXAM: 2D DIGITAL DIAGNOSTIC LEFT MAMMOGRAM WITH CAD AND ADJUNCT TOMO ULTRASOUND LEFT BREAST COMPARISON:  Previous exam(s). ACR Breast Density Category c: The breast tissue is heterogeneously dense, which may obscure small masses. FINDINGS: In the upper-outer quadrant of the left breast, there  is an asymmetry measuring approximately 8 mm. No other suspicious calcifications, masses or areas of distortion are seen in the left breast. Mammographic images were processed with CAD. On physical exam, no discrete palpable masses are identified in the upper-outer left breast. Targeted ultrasound is performed, showing an irregular hypoechoic mass at 2 o'clock, 6 cm from the nipple measuring 1.4 x 0.3 x 0.7 cm. Blood flow is documented within the mass on color Doppler imaging. Ultrasound of the left axilla demonstrates normal-appearing lymph nodes. IMPRESSION: 1. There is an indeterminate 1.4 cm mass in the left breast at 2 o'clock. 2.  No sonographic evidence of left axillary lymphadenopathy. RECOMMENDATION: 1. Ultrasound-guided biopsy is recommended for the left breast at 2 o'clock. This has been scheduled for 01/15/2016 at 12:45 p.m. 2. The patient has very strong family history of breast cancer with history of 4 sisters and 4 nieces with history of breast cancer. MRI is recommended, as per American Cancer Society guidelines, if the patient has a calculated lifetime risk of developing breast cancer of greater than 20%, annual screening MRI of the breasts would be recommended at the time of screening mammography. I have discussed the findings and recommendations with the patient. Results were also provided in writing at the conclusion of the visit. If applicable, a reminder letter will be sent to the patient regarding the next appointment. BI-RADS CATEGORY  4: Suspicious. Electronically Signed   By: Ammie Ferrier M.D.   On: 01/13/2016 10:44   Mm Diag Breast Tomo Uni Left  Result Date: 01/13/2016 CLINICAL DATA:  Screening recall for possible left breast mass. EXAM: 2D DIGITAL DIAGNOSTIC LEFT MAMMOGRAM WITH CAD AND ADJUNCT TOMO ULTRASOUND LEFT BREAST COMPARISON:  Previous exam(s). ACR Breast Density Category c: The breast tissue is heterogeneously dense, which may obscure small masses. FINDINGS: In the  upper-outer quadrant of the left breast, there is an asymmetry measuring approximately 8 mm. No other suspicious calcifications, masses or areas of distortion are seen in the left breast. Mammographic images were processed with CAD. On physical exam, no discrete palpable masses are identified in the upper-outer left breast. Targeted ultrasound is performed, showing an irregular hypoechoic mass at 2 o'clock, 6 cm from the nipple measuring 1.4 x 0.3 x 0.7  cm. Blood flow is documented within the mass on color Doppler imaging. Ultrasound of the left axilla demonstrates normal-appearing lymph nodes. IMPRESSION: 1. There is an indeterminate 1.4 cm mass in the left breast at 2 o'clock. 2.  No sonographic evidence of left axillary lymphadenopathy. RECOMMENDATION: 1. Ultrasound-guided biopsy is recommended for the left breast at 2 o'clock. This has been scheduled for 01/15/2016 at 12:45 p.m. 2. The patient has very strong family history of breast cancer with history of 4 sisters and 4 nieces with history of breast cancer. MRI is recommended, as per American Cancer Society guidelines, if the patient has a calculated lifetime risk of developing breast cancer of greater than 20%, annual screening MRI of the breasts would be recommended at the time of screening mammography. I have discussed the findings and recommendations with the patient. Results were also provided in writing at the conclusion of the visit. If applicable, a reminder letter will be sent to the patient regarding the next appointment. BI-RADS CATEGORY  4: Suspicious. Electronically Signed   By: Ammie Ferrier M.D.   On: 01/13/2016 10:44   Mm Screening Breast Tomo Bilateral  Result Date: 01/06/2016 CLINICAL DATA:  Screening. EXAM: 2D DIGITAL SCREENING BILATERAL MAMMOGRAM WITH CAD AND ADJUNCT TOMO COMPARISON:  Previous exam(s). ACR Breast Density Category c: The breast tissue is heterogeneously dense, which may obscure small masses. FINDINGS: In the left  breast, a possible mass or enlarging lymph node warrants further evaluation. In the right breast, no findings suspicious for malignancy. Images were processed with CAD. IMPRESSION: Further evaluation is suggested for possible mass or enlarging lymph node in the left breast. RECOMMENDATION: 3D diagnostic mammogram and possibly ultrasound of the left breast. (Code:FI-L-65M) The patient will be contacted regarding the findings, and additional imaging will be scheduled. BI-RADS CATEGORY  0: Incomplete. Need additional imaging evaluation and/or prior mammograms for comparison. Electronically Signed   By: Evangeline Dakin M.D.   On: 01/06/2016 16:26   [place] Lt Breast Bx W Loc Dev 1st Lesion Img Bx Spec US Guide  Addendum Date: 01/19/2016   ADDENDUM REPORT: 01/19/2016 13:03 ADDENDUM: Pathology revealed HIGH GRADE DUCTAL CARCINOMA of the upper outer quadrant of the Left breast. This was found to be concordant by Dr. Ammie Ferrier. Pathology results were discussed with the patient by telephone. The patient reported doing well after the biopsy with tenderness at the site. Post biopsy instructions and care were reviewed and questions were answered. The patient was encouraged to call [facility] for any additional concerns. The patient was referred to [facility] on January 27, 2016. Pathology results reported by Terie Purser, RN on 01/19/2016. Electronically Signed   By: Ammie Ferrier M.D.   On: 01/19/2016 13:03   Result Date: 01/19/2016 CLINICAL DATA:  70 year old female presenting for ultrasound-guided biopsy of a left breast mass at 2 o'clock. EXAM: ULTRASOUND GUIDED LEFT BREAST CORE NEEDLE BIOPSY COMPARISON:  Previous exam(s). FINDINGS: I met with the patient and we discussed the procedure of ultrasound-guided biopsy, including benefits and alternatives. We discussed the high likelihood of a successful procedure.  We discussed the risks of the procedure, including infection, bleeding, tissue injury, clip migration, and inadequate sampling. Informed written consent was given. The usual time-out protocol was performed immediately prior to the procedure. Using sterile technique and 1% Lidocaine as local anesthetic, under direct ultrasound visualization, a 14 gauge spring-loaded device was used to perform biopsy of left breast  mass in the upper-outer quadrant at 2 o'clock using a lateral approach. At the conclusion of the procedure a coil shaped tissue marker clip was deployed into the biopsy cavity. Follow up 2 view mammogram was performed and dictated separately. IMPRESSION: Ultrasound guided biopsy of left breast mass at 2 o'clock. No apparent complications. Electronically Signed: By: Ammie Ferrier M.D. On: 01/15/2016 13:51       IMPRESSION/PLAN: 1. 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 Dr. Lisbeth Renshaw discusses the pathology findings and reviews the nature of in situ and invasive disease. The consensus from the breast conference include breast conservation with lumpectomy with sentinel mapping being included due to the question of microinvasion. If cancer was noted, oncotype would be considered per Dr. Jana Hakim. Provided that chemotherapy is not indicated, the patient's course would then be followed by external radiotherapy to the breast followed by probable estrogen blockade. Dr. Lisbeth Renshaw recommends proceeding with 20 fractions of radiation treatment over 4 weeks. We discussed the risks, benefits, short, and long term effects of radiotherapy, and the patient is interested in proceeding. Dr. Lisbeth Renshaw discusses the delivery and logistics of radiotherapy. We will see her back about 2 weeks after surgery to move forward with the simulation and planning process and anticipate starting radiotherapy about 4 weeks after surgery.  2. Possible genetic predisposition to breast cancer.  The patient may be interested genetic testing. But is not ready to move forward with testing at this moment.  The above documentation reflects my direct findings during this shared patient visit. Please see the separate note by Dr. Lisbeth Renshaw on this date for the remainder of the patient's plan of care.    Carola Rhine, PAC

## 2016-01-27 NOTE — Progress Notes (Signed)
[place]  [phone] [phone]     ID: REMMI ARMENTEROS DOB: 08/20/1945  MR#: [id number]  NLZ#:[id number]  Patient Care Team: Harlan Stains, MD as PCP - General (Family Medicine) [name], MD as Consulting Physician (General Surgery) Chauncey Cruel, MD as Consulting Physician (Oncology) Kyung Rudd, MD as Consulting Physician (Radiation Oncology) OTHER MD:  CHIEF COMPLAINT: Ductal carcinoma in situ  CURRENT TREATMENT: Awaiting definitive surgery   BREAST CANCER HISTORY: Paula Dawson had bilateral screening mammography at the [facility] 01/06/2016, showing a possible mass in the left breast. On 01/13/2016 she had left diagnostic mammography with tomography and left breast ultrasonography. The breast density was category C. In the upper outer quadrant of the left breast there was an area of asymmetry measuring 0.8 cm. On physical exam there was no palpable mass. Ultrasonography confirmed an irregular hypoechoic mass at the 2:00 position 6 cm from the nipple measuring 1.4 cm. Ultrasound of the left axilla was benign.  On 01/15/2016 the patient underwent biopsy of the left breast mass in question, and this showed ([id number]) ductal carcinoma in situ, grade 3, estrogen receptor negative, progesterone receptor questionably positive (20% with weak staining intensity).  Her subsequent history is as detailed below  INTERVAL HISTORY: Paula Dawson was evaluated in the multidisciplinary breast cancer clinic on 01/27/2016 accompanied by her son Paula Dawson and her sister Paula Dawson. Her case was also presented in the multidisciplinary breast cancer conference that same morning. At that time a preliminary plan was proposed: Breast conserving surgery with sentinel lymph node sampling, consideration of MRI, genetics, and radiation as appropriate  REVIEW OF SYSTEMS: There were no specific symptoms leading to the original mammogram, which was routinely scheduled. The  patient denies unusual headaches, visual changes, nausea, vomiting, stiff neck, dizziness, or gait imbalance. There has been no cough, phlegm production, or pleurisy, no chest pain or pressure, and no change in bowel or bladder habits. The patient denies fever, rash, bleeding, unexplained fatigue or unexplained weight loss. 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 She bruises easily. She has chronic low back pain and arthritis in other joints, but not more persistent or intense than usual. A detailed review of systems was otherwise entirely negative.  PAST MEDICAL HISTORY: Past Medical History:  Diagnosis Date  . Anemia   . Arthritis   . Dry eye syndrome   . HSV infection    pos I and II  . Macular degeneration of both eyes   . Osteopenia   . Osteoporosis 1998   Off Fosomax 2008, Prolia 03/17/11, 09/14/11, 03/16/12  . Thyroid disease age 73   hypo    PAST SURGICAL HISTORY: Past Surgical History:  Procedure Laterality Date  . AUGMENTATION MAMMAPLASTY  1981  . BREAST IMPLANT REMOVAL  1982  . COLONOSCOPY  10/04   Dr Watt Climes  . COLONOSCOPY  04/07/08   normal - Dr. Watt Climes  . ELBOW SURGERY Left 12/1997  . ESOPHAGOGASTRODUODENOSCOPY ENDOSCOPY  08/2012   [place] hernia with GERD  . FOOT NEUROMA SURGERY Left 2004?  . TONSILLECTOMY AND ADENOIDECTOMY    . TUBAL LIGATION  1981    FAMILY HISTORY Family History  Problem Relation Age of Onset  . Heart failure Mother   . Prostate cancer Father 23  . Liver cancer Father   . Colon polyps Sister   . Breast cancer Sister 38  . Breast cancer Sister 49  . Ovarian cancer Cousin  died at 48  . Breast cancer Other     X 4 + age 15, 86 with recurrence, 101, 66  . Breast cancer Sister 48    recurrence 20 yrs later  . Colon polyps Sister   . Breast cancer Sister 74  . Lung cancer Brother     smoked  The patient's father died at age 29. He had been diagnosed with prostate "and liver" cancer at the age of  67. The patient's mother died at age 38. The patient had 4 brothers, 5 sisters. One brother was diagnosed with lung cancer at age 64. She has 4 sisters with breast cancer, one initially diagnosed at age 19, recurring at age 77.  GYNECOLOGIC HISTORY:  Patient's last menstrual period was 05/16/2000 (approximate). Menarche age 62, first live birth age 26, the patient is GX P1. She stopped having periods approximately 2002. She used hormone replacement for approximately 5 years. 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 She is divorced and lives alone, with 1. Her son Paula Dawson lives in [place]. His wife is a Cabin crew and he is her handyman. The patient has 2 grandsons. She attends a local [employer] in the [place] area    [place]: In place; the patient has named her son Paula Dawson as her healthcare part of attorney. He can be reached at [phone].   HEALTH MAINTENANCE: Social History  Substance Use Topics  . Smoking status: Never Smoker  . Smokeless tobacco: Never Used  . Alcohol use No     Colonoscopy: 2014/May got  PAP:  Bone density: December 2016, T score -2.9   No Known Allergies  Current Outpatient Prescriptions  Medication Sig Dispense Refill  . Ascorbic Acid Buffered (BUFFERED VITAMIN C PO) Take 750 mg by mouth.    . B Complex-Folic Acid (B COMPLEX-VITAMIN B12 PO) Take 750 mg by mouth daily.    Marland Kitchen BABY ASPIRIN PO Take 81 mg by mouth daily.    . carboxymethylcellulose (REFRESH PLUS) 0.5 % SOLN Place 1 drop into both eyes 3 (three) times daily as needed.    Marland Kitchen denosumab (PROLIA) 60 MG/ML SOLN injection Inject 60 mg into the skin every 6 (six) months. Administer in upper arm, thigh, or abdomen    . Estradiol 10 MCG TABS vaginal tablet Place 1 tablet vaginally 2 (two) times a week.    . Multiple Vitamins-Minerals (MULTIVITAMIN WOMEN  50+) TABS Take 1 tablet by mouth daily.    Marland Kitchen OVER THE COUNTER MEDICATION Take 2 tablets by mouth daily. Areds 2    . ranitidine (ZANTAC) 150 MG capsule Take 150 mg by mouth 2 (two) times daily.    . RESTASIS 0.05 % ophthalmic emulsion Place 1 drop into both eyes daily.    Marland Kitchen SYNTHROID 88 MCG tablet Take 1 tablet by mouth daily.    . valACYclovir (VALTREX) 1000 MG tablet Take 500 mg by mouth daily.    . Vitamin D, Ergocalciferol, (DRISDOL) 50000 units CAPS capsule Take 1 capsule by mouth every 14 (fourteen) days.     No current facility-administered medications for this visit.     OBJECTIVE: Middle-aged white woman who appears stated age 6:   01/27/16 1240  BP: (!) 141/80  Pulse: 79  Resp: 18  Temp: 98.3 F (36.8 C)     Body mass index is 20.34 kg/m.    ECOG FS:0 -  Asymptomatic  Ocular: Sclerae unicteric, pupils equal, round and reactive to light Ear-nose-throat: Oropharynx clear and moist Lymphatic: No cervical or supraclavicular adenopathy Lungs no rales or rhonchi, good excursion bilaterally Heart regular rate and rhythm, no murmur appreciated Abd soft, nontender, positive bowel sounds MSK no focal spinal tenderness, no joint edema Neuro: non-focal, well-oriented, appropriate affect Breasts: The right breast is unremarkable. The left breast is status post recent biopsy. I do not palpate a mass. There are no skin or nipple changes of concern. The left axilla is benign.   LAB RESULTS:  CMP     Component Value Date/Time   NA 142 01/27/2016 1223   K 3.6 01/27/2016 1223   CL 103 04/06/2015 1341   CO2 27 01/27/2016 1223   GLUCOSE 131 01/27/2016 1223   BUN 14.8 01/27/2016 1223   CREATININE 0.8 01/27/2016 1223   CALCIUM 9.1 01/27/2016 1223   PROT 6.6 01/27/2016 1223   ALBUMIN 3.6 01/27/2016 1223   AST 21 01/27/2016 1223   ALT 21 01/27/2016 1223   ALKPHOS 93 01/27/2016 1223   BILITOT 0.36 01/27/2016 1223    INo results found for: SPEP, UPEP  Lab Results    Component Value Date   WBC 8.8 01/27/2016   NEUTROABS 7.0 (H) 01/27/2016   HGB 15.3 01/27/2016   HCT 46.1 01/27/2016   MCV 96.2 01/27/2016   PLT 233 01/27/2016      Chemistry      Component Value Date/Time   NA 142 01/27/2016 1223   K 3.6 01/27/2016 1223   CL 103 04/06/2015 1341   CO2 27 01/27/2016 1223   BUN 14.8 01/27/2016 1223   CREATININE 0.8 01/27/2016 1223      Component Value Date/Time   CALCIUM 9.1 01/27/2016 1223   ALKPHOS 93 01/27/2016 1223   AST 21 01/27/2016 1223   ALT 21 01/27/2016 1223   BILITOT 0.36 01/27/2016 1223       No results found for: LABCA2  No components found for: LABCA125  No results for input(s): INR in the last 168 hours.  Urinalysis    Component Value Date/Time   BILIRUBINUR - 05/29/2014 1012   PROTEINUR - 05/29/2014 1012   UROBILINOGEN negative 05/29/2014 1012   NITRITE - 05/29/2014 1012   LEUKOCYTESUR Negative 05/29/2014 1012     STUDIES: Mm Digital Diagnostic Unilat L  Result Date: 01/15/2016 CLINICAL DATA:  Post biopsy mammogram of the left breast for clip placement. EXAM: DIAGNOSTIC LEFT MAMMOGRAM POST ULTRASOUND BIOPSY COMPARISON:  Previous exam(s). FINDINGS: Mammographic images were obtained following ultrasound guided biopsy of left breast mass at 2 o'clock. The coil shaped biopsy marking clip is identified on the true lateral view in the upper-outer quadrant at the expected location. Due to the far posterior position of the lesion, this could not be included on the CC view. IMPRESSION: Appropriate positioning of the coil shaped biopsy marking clip in the upper-outer quadrant of the left breast. Final Assessment: Post Procedure Mammograms for Marker Placement Electronically Signed   By: Ammie Ferrier M.D.   On: 01/15/2016 13:53   US Breast Ltd Uni Left Inc Axilla  Result Date: 01/13/2016 CLINICAL DATA:  Screening recall for possible left breast mass. EXAM: 2D DIGITAL DIAGNOSTIC LEFT MAMMOGRAM WITH CAD AND ADJUNCT TOMO  ULTRASOUND LEFT BREAST COMPARISON:  Previous exam(s). ACR Breast Density Category c: The breast tissue is heterogeneously dense, which may obscure small masses. FINDINGS: In the upper-outer quadrant of the left breast, there is an asymmetry measuring approximately 8 mm.  No other suspicious calcifications, masses or areas of distortion are seen in the left breast. Mammographic images were processed with CAD. On physical exam, no discrete palpable masses are identified in the upper-outer left breast. Targeted ultrasound is performed, showing an irregular hypoechoic mass at 2 o'clock, 6 cm from the nipple measuring 1.4 x 0.3 x 0.7 cm. Blood flow is documented within the mass on color Doppler imaging. Ultrasound of the left axilla demonstrates normal-appearing lymph nodes. IMPRESSION: 1. There is an indeterminate 1.4 cm mass in the left breast at 2 o'clock. 2.  No sonographic evidence of left axillary lymphadenopathy. RECOMMENDATION: 1. Ultrasound-guided biopsy is recommended for the left breast at 2 o'clock. This has been scheduled for 01/15/2016 at 12:45 p.m. 2. The patient has very strong family history of breast cancer with history of 4 sisters and 4 nieces with history of breast cancer. MRI is recommended, as per American Cancer Society guidelines, if the patient has a calculated lifetime risk of developing breast cancer of greater than 20%, annual screening MRI of the breasts would be recommended at the time of screening mammography. I have discussed the findings and recommendations with the patient. Results were also provided in writing at the conclusion of the visit. If applicable, a reminder letter will be sent to the patient regarding the next appointment. BI-RADS CATEGORY  4: Suspicious. Electronically Signed   By: Ammie Ferrier M.D.   On: 01/13/2016 10:44   Mm Diag Breast Tomo Uni Left  Result Date: 01/13/2016 CLINICAL DATA:  Screening recall for possible left breast mass. EXAM: 2D DIGITAL DIAGNOSTIC  LEFT MAMMOGRAM WITH CAD AND ADJUNCT TOMO ULTRASOUND LEFT BREAST COMPARISON:  Previous exam(s). ACR Breast Density Category c: The breast tissue is heterogeneously dense, which may obscure small masses. FINDINGS: In the upper-outer quadrant of the left breast, there is an asymmetry measuring approximately 8 mm. No other suspicious calcifications, masses or areas of distortion are seen in the left breast. Mammographic images were processed with CAD. On physical exam, no discrete palpable masses are identified in the upper-outer left breast. Targeted ultrasound is performed, showing an irregular hypoechoic mass at 2 o'clock, 6 cm from the nipple measuring 1.4 x 0.3 x 0.7 cm. Blood flow is documented within the mass on color Doppler imaging. Ultrasound of the left axilla demonstrates normal-appearing lymph nodes. IMPRESSION: 1. There is an indeterminate 1.4 cm mass in the left breast at 2 o'clock. 2.  No sonographic evidence of left axillary lymphadenopathy. RECOMMENDATION: 1. Ultrasound-guided biopsy is recommended for the left breast at 2 o'clock. This has been scheduled for 01/15/2016 at 12:45 p.m. 2. The patient has very strong family history of breast cancer with history of 4 sisters and 4 nieces with history of breast cancer. MRI is recommended, as per American Cancer Society guidelines, if the patient has a calculated lifetime risk of developing breast cancer of greater than 20%, annual screening MRI of the breasts would be recommended at the time of screening mammography. I have discussed the findings and recommendations with the patient. Results were also provided in writing at the conclusion of the visit. If applicable, a reminder letter will be sent to the patient regarding the next appointment. BI-RADS CATEGORY  4: Suspicious. Electronically Signed   By: Ammie Ferrier M.D.   On: 01/13/2016 10:44   Mm Screening Breast Tomo Bilateral  Result Date: 01/06/2016 CLINICAL DATA:  Screening. EXAM: 2D DIGITAL  SCREENING BILATERAL MAMMOGRAM WITH CAD AND ADJUNCT TOMO COMPARISON:  Previous exam(s). ACR Breast Density  Category c: The breast tissue is heterogeneously dense, which may obscure small masses. FINDINGS: In the left breast, a possible mass or enlarging lymph node warrants further evaluation. In the right breast, no findings suspicious for malignancy. Images were processed with CAD. IMPRESSION: Further evaluation is suggested for possible mass or enlarging lymph node in the left breast. RECOMMENDATION: 3D diagnostic mammogram and possibly ultrasound of the left breast. (Code:[id number]) The patient will be contacted regarding the findings, and additional imaging will be scheduled. BI-RADS CATEGORY  0: Incomplete. Need additional imaging evaluation and/or prior mammograms for comparison. Electronically Signed   By: Evangeline Dakin M.D.   On: 01/06/2016 16:26   [place] Lt Breast Bx W Loc Dev 1st Lesion Img Bx Spec US Guide  Addendum Date: 01/19/2016   ADDENDUM REPORT: 01/19/2016 13:03 ADDENDUM: Pathology revealed HIGH GRADE DUCTAL CARCINOMA of the upper outer quadrant of the Left breast. This was found to be concordant by Dr. Ammie Ferrier. Pathology results were discussed with the patient by telephone. The patient reported doing well after the biopsy with tenderness at the site. Post biopsy instructions and care were reviewed and questions were answered. The patient was encouraged to call [facility] for any additional concerns. The patient was referred to [facility] at [facility] on January 27, 2016. Pathology results reported by Terie Purser, RN on 01/19/2016. Electronically Signed   By: Ammie Ferrier M.D.   On: 01/19/2016 13:03   Result Date: 01/19/2016 CLINICAL DATA:  70 year old female presenting for ultrasound-guided biopsy of a left breast mass at 2 o'clock. EXAM: ULTRASOUND GUIDED LEFT BREAST CORE NEEDLE BIOPSY  COMPARISON:  Previous exam(s). FINDINGS: I met with the patient and we discussed the procedure of ultrasound-guided biopsy, including benefits and alternatives. We discussed the high likelihood of a successful procedure. We discussed the risks of the procedure, including infection, bleeding, tissue injury, clip migration, and inadequate sampling. Informed written consent was given. The usual time-out protocol was performed immediately prior to the procedure. Using sterile technique and 1% Lidocaine as local anesthetic, under direct ultrasound visualization, a 14 gauge spring-loaded device was used to perform biopsy of left breast mass in the upper-outer quadrant at 2 o'clock using a lateral approach. At the conclusion of the procedure a coil shaped tissue marker clip was deployed into the biopsy cavity. Follow up 2 view mammogram was performed and dictated separately. IMPRESSION: Ultrasound guided biopsy of left breast mass at 2 o'clock. No apparent complications. Electronically Signed: By: Ammie Ferrier M.D. On: 01/15/2016 13:51    ELIGIBLE FOR AVAILABLE RESEARCH PROTOCOL: no  ASSESSMENT: 70 y.o. [place] woman status post left breast upper outer quadrant biopsy 01/15/2016 for ductal carcinoma in situ, grade 3, estrogen receptor negative, progesterone receptor questionably positive  (1) definitive surgery pending  (2) adjuvant radiation to follow as appropriate  (3) consider genetics testing  PLAN: We spent the better part of today's hour-long appointment discussing the biology of breast cancer in general, and the specifics of the patient's tumor in particular. Daron understands that in noninvasive ductal carcinoma, also called ductal carcinoma in situ ("DCIS") the breast cancer cells remain trapped in the ducts were they started. They cannot travel to a vital organ. For that reason these cancers in themselves are not life-threatening.  If the whole breast is removed then all the ducts are  removed and since the cancer cells are trapped in the ducts, the cure rate with mastectomy for noninvasive  breast cancer is approximately 99%. Nevertheless we recommend lumpectomy, because there is no survival advantage to mastectomy and because the cosmetic result is generally superior with breast conservation.  Since the patient is keeping her breasts, there will be some risk of recurrence. The recurrence can only be in the same breast since, again, the cells are trapped in the ducts. There is no connection from one breast to the other. The risk of local recurrence is cut by more than half with radiation, which is standard in this situation.  In estrogen receptor positive cancers anti-estrogens can also be considered. Zayah's breast cancer is essentially estrogen and progesterone receptor negative--progesterone receptor positivity in the setting of estrogen receptor negativity is always questionable, and in this case it is very patchy and weak staining. I would consider her as progesterone receptor negative as well. Accordingly at this point anti-estrogens are not planned, although he can always be considered prophylactically.  The plan accordingly is for definitive surgery with radiation as appropriate. Manami will return to see me early December. She should be done with her local treatment by then. We can discuss at that point whether she is interested in the use of anti-estrogens for prophylaxis.  Rajean has a good understanding of the overall plan. She agrees with it. She knows the goal of treatment in her case is cure. She will call with any problems that may develop before her next visit here.  Chauncey Cruel, MD   01/27/2016 4:22 PM Medical Oncology and Hematology [facility] [address] [place], [place] [address] Tel. [phone]    Fax. [phone]

## 2016-01-27 NOTE — Patient Instructions (Signed)

## 2016-01-28 NOTE — Progress Notes (Signed)
[place] Psychosocial Distress Screening Spiritual Care  Met with Paula Dawson, son Paula Dawson, and a close friend in Breast Multidisciplinary Clinic to introduce [facility] team/resources, reviewing distress screen per protocol.  The patient scored a 0.5 on the Psychosocial Distress Thermometer which indicates mild distress. Also assessed for distress and other psychosocial needs.   ONCBCN DISTRESS SCREENING 01/27/2016  Screening Type Initial Screening  Distress experienced in past week (1-10) 0    Per [name], family members and close friends with hx breast ca are sources of support for her.  She states that she has no other needs or concerns at this time.  Follow up needed: No.  Pt has full packet of [place], but please also page if needs arise/circumstances change.  Thank you.   [place], [place], [facility] Pager [phone] Voicemail [phone]

## 2016-02-01 ENCOUNTER — Other Ambulatory Visit: Payer: Self-pay | Admitting: General Surgery

## 2016-02-01 DIAGNOSIS — C50412 Malignant neoplasm of upper-outer quadrant of left female breast: Secondary | ICD-10-CM

## 2016-02-02 ENCOUNTER — Telehealth: Payer: Self-pay | Admitting: *Deleted

## 2016-02-02 NOTE — Telephone Encounter (Signed)
Spoke to pt concerning [place] from 01/27/16. Denies needs at this time. Discussed and confirmed all future appts and surgery. Encourage pt to call with further questions or concerns. Received verbal understanding. Contact information provided.

## 2016-02-09 ENCOUNTER — Encounter (HOSPITAL_COMMUNITY)
Admission: RE | Admit: 2016-02-09 | Discharge: 2016-02-09 | Disposition: A | Discharge status: Home / Self Care | Payer: Medicare Other | Source: Ambulatory Visit | Attending: General Surgery | Admitting: General Surgery

## 2016-02-09 ENCOUNTER — Encounter (HOSPITAL_COMMUNITY): Payer: Self-pay

## 2016-02-09 DIAGNOSIS — C50412 Malignant neoplasm of upper-outer quadrant of left female breast: Secondary | ICD-10-CM | POA: Diagnosis not present

## 2016-02-09 DIAGNOSIS — Z170 Estrogen receptor positive status [ER+]: Secondary | ICD-10-CM | POA: Diagnosis not present

## 2016-02-09 DIAGNOSIS — K219 Gastro-esophageal reflux disease without esophagitis: Secondary | ICD-10-CM | POA: Diagnosis not present

## 2016-02-09 DIAGNOSIS — C50912 Malignant neoplasm of unspecified site of left female breast: Secondary | ICD-10-CM | POA: Diagnosis present

## 2016-02-09 DIAGNOSIS — Z803 Family history of malignant neoplasm of breast: Secondary | ICD-10-CM | POA: Diagnosis not present

## 2016-02-09 DIAGNOSIS — M1990 Unspecified osteoarthritis, unspecified site: Secondary | ICD-10-CM | POA: Diagnosis not present

## 2016-02-09 HISTORY — DX: Calculus of gallbladder without cholecystitis without obstruction: K80.20

## 2016-02-09 HISTORY — DX: Gastro-esophageal reflux disease without esophagitis: K21.9

## 2016-02-09 HISTORY — DX: Presence of spectacles and contact lenses: Z97.3

## 2016-02-09 HISTORY — DX: Unspecified cataract: H26.9

## 2016-02-09 HISTORY — DX: Personal history of other diseases of the digestive system: Z87.19

## 2016-02-09 LAB — BASIC METABOLIC PANEL
ANION GAP: 8 (ref 5–15)
BUN: 14 mg/dL (ref 6–20)
CHLORIDE: 108 mmol/L (ref 101–111)
CO2: 25 mmol/L (ref 22–32)
CREATININE: 0.73 mg/dL (ref 0.44–1.00)
Calcium: 9.1 mg/dL (ref 8.9–10.3)
GFR calc non Af Amer: >60 mL/min (ref >60)
Glucose, Bld: 110 mg/dL — ABNORMAL HIGH (ref 65–99)
Potassium: 3.7 mmol/L (ref 3.5–5.1)
Sodium: 141 mmol/L (ref 135–145)

## 2016-02-09 LAB — CBC
HEMATOCRIT: 44.7 % (ref 36.0–46.0)
HEMOGLOBIN: 14.9 g/dL (ref 12.0–15.0)
MCH: 32 pg (ref 26.0–34.0)
MCHC: 33.3 g/dL (ref 30.0–36.0)
MCV: 96.1 fL (ref 78.0–100.0)
Platelets: 255 10*3/uL (ref 150–400)
RBC: 4.65 MIL/uL (ref 3.87–5.11)
RDW: 13 % (ref 11.5–15.5)
WBC: 10.9 10*3/uL — ABNORMAL HIGH (ref 4.0–10.5)

## 2016-02-09 NOTE — Pre-Procedure Instructions (Signed)
    KALAIAH DAROCHA  02/09/2016      [facility] - [place], [place] - [address] [place] [address] Phone: [phone] Fax: [phone]  [facility] - [place], [place] - [address] AT [facility] [address] [place] [address] Phone: [phone] Fax: [phone]    Your procedure is scheduled on Thursday, February 11, 2016  Report to [facility] Admitting at 7:00 A.M.  Call this number if you have problems the morning of surgery:  [phone]   Remember:  Do not eat food or drink liquids after midnight Wednesday, February 10, 2016  Take these medicines the morning of surgery with A SIP OF WATER : ranitidine (ZANTAC), SYNTHROID, RESTASIS eye drops Stop taking Aspirin, vitamins, fish oil, and herbal medications. Do not take any NSAIDs ie: Ibuprofen, Advil, Naproxen, BC and Goody Powder or any medication containing Aspirin; stop now.  Do not wear jewelry, make-up or nail polish.  Do not wear lotions, powders, or perfumes, or deoderant.   Do not bring valuables to the hospital.  [facility] is not responsible for any belongings or valuables.  Contacts, dentures or bridgework may not be worn into surgery.  Leave your suitcase in the car.  After surgery it may be brought to your room.  For patients admitted to the hospital, discharge time will be determined by your treatment team.  Patients discharged the day of surgery will not be allowed to drive home.   Name and phone number of your driver:    Special instructions: Shower the night before surgery and the morning of surgery with CHG.  Please read over the following fact sheets that you were given. Pain Booklet, Coughing and Deep Breathing and Surgical Site Infection Prevention

## 2016-02-09 NOTE — Progress Notes (Signed)
Pt denies SOB, chest pain, and being under the care of a cardiologist. Pt denies having a stress test, echo and cardiac cath. Pt denies having an EKG and chest x ray within the last year. 

## 2016-02-10 ENCOUNTER — Other Ambulatory Visit: Payer: Self-pay | Admitting: General Surgery

## 2016-02-10 ENCOUNTER — Ambulatory Visit
Admission: RE | Admit: 2016-02-10 | Discharge: 2016-02-10 | Disposition: A | Discharge status: Home / Self Care | Payer: Medicare Other | Source: Ambulatory Visit | Attending: General Surgery | Admitting: General Surgery

## 2016-02-10 DIAGNOSIS — C50412 Malignant neoplasm of upper-outer quadrant of left female breast: Secondary | ICD-10-CM

## 2016-02-10 NOTE — Anesthesia Preprocedure Evaluation (Addendum)
Anesthesia Evaluation  Patient identified by MRN, date of birth, ID band Patient awake    Reviewed: Allergy & Precautions, NPO status , Patient's Chart, lab work & pertinent test results  History of Anesthesia Complications Negative for: history of anesthetic complications  Airway Mallampati: I  TM Distance: >3 FB Neck ROM: Full    Dental  (+) Teeth Intact, Dental Advisory Given Permanent bridge:   Pulmonary neg shortness of breath, neg sleep apnea, neg COPD, neg recent URI,  bronchiectasis   Pulmonary exam normal breath sounds clear to auscultation       Cardiovascular Exercise Tolerance: Good negative cardio ROS   Rhythm:Regular Rate:Normal     Neuro/Psych negative neurological ROS     GI/Hepatic Neg liver ROS, hiatal hernia, GERD  Medicated and Controlled,  Endo/Other  neg diabetesHypothyroidism   Renal/GU Renal disease (nephrolithiasis)     Musculoskeletal  (+) Arthritis , osteopenia   Abdominal   Peds  Hematology  (+) Blood dyscrasia, anemia ,   Anesthesia Other Findings Left breast cancer, dry eye syndrome, macular degeneration  Reproductive/Obstetrics                            Anesthesia Physical Anesthesia Plan  ASA: III  Anesthesia Plan: General and Regional   Post-op Pain Management: GA combined w/ Regional for post-op pain   Induction: Intravenous  Airway Management Planned: LMA  Additional Equipment:   Intra-op Plan:   Post-operative Plan: Extubation in OR  Informed Consent: I have reviewed the patients History and Physical, chart, labs and discussed the procedure including the risks, benefits and alternatives for the proposed anesthesia with the patient or authorized representative who has indicated his/her understanding and acceptance.   Dental advisory given  Plan Discussed with: CRNA  Anesthesia Plan Comments: (Risks of general anesthesia discussed  including, but not limited to, sore throat, hoarse voice, chipped/damaged teeth, injury to vocal cords, nausea and vomiting, allergic reactions, lung infection, heart attack, stroke, and death. All questions answered.  Discussed potential risks of nerve blocks including, but not limited to, infection, bleeding, nerve damage, seizures, pneumothorax, respiratory depression, and potential failure of the block. Alternatives to nerve blocks discussed. All questions answered. )       Anesthesia Quick Evaluation

## 2016-02-11 ENCOUNTER — Ambulatory Visit (HOSPITAL_COMMUNITY)
Admission: RE | Admit: 2016-02-11 | Discharge: 2016-02-11 | Disposition: A | Discharge status: Home / Self Care | Payer: Medicare Other | Source: Ambulatory Visit | Attending: General Surgery | Admitting: General Surgery

## 2016-02-11 ENCOUNTER — Ambulatory Visit (HOSPITAL_COMMUNITY): Payer: Medicare Other | Admitting: Anesthesiology

## 2016-02-11 ENCOUNTER — Encounter (HOSPITAL_COMMUNITY)
Admission: RE | Disposition: A | Discharge status: Home / Self Care | Payer: Self-pay | Source: Ambulatory Visit | Attending: General Surgery

## 2016-02-11 ENCOUNTER — Ambulatory Visit
Admission: RE | Admit: 2016-02-11 | Discharge: 2016-02-11 | Disposition: A | Discharge status: Home / Self Care | Payer: Medicare Other | Source: Ambulatory Visit | Attending: General Surgery | Admitting: General Surgery

## 2016-02-11 ENCOUNTER — Encounter (HOSPITAL_COMMUNITY): Payer: Self-pay | Admitting: General Surgery

## 2016-02-11 DIAGNOSIS — C50412 Malignant neoplasm of upper-outer quadrant of left female breast: Secondary | ICD-10-CM

## 2016-02-11 DIAGNOSIS — K219 Gastro-esophageal reflux disease without esophagitis: Secondary | ICD-10-CM | POA: Insufficient documentation

## 2016-02-11 DIAGNOSIS — Z803 Family history of malignant neoplasm of breast: Secondary | ICD-10-CM | POA: Diagnosis not present

## 2016-02-11 DIAGNOSIS — Z170 Estrogen receptor positive status [ER+]: Secondary | ICD-10-CM | POA: Diagnosis not present

## 2016-02-11 DIAGNOSIS — M1990 Unspecified osteoarthritis, unspecified site: Secondary | ICD-10-CM | POA: Insufficient documentation

## 2016-02-11 HISTORY — PX: BREAST LUMPECTOMY: SHX2

## 2016-02-11 HISTORY — PX: BREAST LUMPECTOMY WITH RADIOACTIVE SEED AND SENTINEL LYMPH NODE BIOPSY: SHX6550

## 2016-02-11 SURGERY — BREAST LUMPECTOMY WITH RADIOACTIVE SEED AND SENTINEL LYMPH NODE BIOPSY
Anesthesia: Regional | Site: Breast | Laterality: Left

## 2016-02-11 MED ORDER — BUPIVACAINE-EPINEPHRINE (PF) 0.5% -1:200000 IJ SOLN
INTRAMUSCULAR | Status: DC | PRN
  Administered 2016-02-11: 30 mL via PERINEURAL

## 2016-02-11 MED ORDER — MIDAZOLAM HCL 5 MG/5ML IJ SOLN
INTRAMUSCULAR | Status: DC | PRN
  Administered 2016-02-11: 2 mg via INTRAVENOUS

## 2016-02-11 MED ORDER — BUPIVACAINE-EPINEPHRINE (PF) 0.25% -1:200000 IJ SOLN
INTRAMUSCULAR | Status: AC
  Filled 2016-02-11: qty 30

## 2016-02-11 MED ORDER — MIDAZOLAM HCL 2 MG/2ML IJ SOLN
INTRAMUSCULAR | Status: AC
  Filled 2016-02-11: qty 2

## 2016-02-11 MED ORDER — SODIUM CHLORIDE 0.9 % IV SOLN: 250.0000 mL | INTRAVENOUS | Status: DC | PRN

## 2016-02-11 MED ORDER — ONDANSETRON HCL 4 MG/2ML IJ SOLN
INTRAMUSCULAR | Status: DC | PRN
  Administered 2016-02-11: 4 mg via INTRAVENOUS

## 2016-02-11 MED ORDER — ACETAMINOPHEN 650 MG RE SUPP
650.0000 mg | RECTAL | Status: DC | PRN
  Filled 2016-02-11: qty 1

## 2016-02-11 MED ORDER — LIDOCAINE 2% (20 MG/ML) 5 ML SYRINGE
INTRAMUSCULAR | Status: DC | PRN
  Administered 2016-02-11: 60 mg via INTRAVENOUS

## 2016-02-11 MED ORDER — LIDOCAINE 2% (20 MG/ML) 5 ML SYRINGE
INTRAMUSCULAR | Status: AC
  Filled 2016-02-11: qty 5

## 2016-02-11 MED ORDER — OXYCODONE HCL 5 MG PO TABS
5.0000 mg | ORAL_TABLET | ORAL | Status: DC | PRN
  Administered 2016-02-11: 10 mg via ORAL

## 2016-02-11 MED ORDER — FENTANYL CITRATE (PF) 100 MCG/2ML IJ SOLN
INTRAMUSCULAR | Status: AC
  Administered 2016-02-11: 25 ug via INTRAVENOUS
  Filled 2016-02-11: qty 2

## 2016-02-11 MED ORDER — MIDAZOLAM HCL 2 MG/2ML IJ SOLN
1.0000 mg | Freq: Once | INTRAMUSCULAR | Status: AC
  Administered 2016-02-11: 1 mg via INTRAVENOUS

## 2016-02-11 MED ORDER — PROPOFOL 10 MG/ML IV BOLUS
INTRAVENOUS | Status: DC | PRN
  Administered 2016-02-11: 30 mg via INTRAVENOUS
  Administered 2016-02-11: 120 mg via INTRAVENOUS

## 2016-02-11 MED ORDER — OXYCODONE HCL 5 MG PO TABS
ORAL_TABLET | ORAL | Status: AC
  Filled 2016-02-11: qty 2

## 2016-02-11 MED ORDER — PROPOFOL 10 MG/ML IV BOLUS
INTRAVENOUS | Status: AC
  Filled 2016-02-11: qty 20

## 2016-02-11 MED ORDER — ONDANSETRON HCL 4 MG/2ML IJ SOLN
INTRAMUSCULAR | Status: AC
  Filled 2016-02-11: qty 2

## 2016-02-11 MED ORDER — SODIUM CHLORIDE 0.9% FLUSH: 3.0000 mL | Freq: Two times a day (BID) | INTRAVENOUS | Status: DC

## 2016-02-11 MED ORDER — CHLORHEXIDINE GLUCONATE CLOTH 2 % EX PADS: 6.0000 | MEDICATED_PAD | Freq: Once | CUTANEOUS | Status: DC

## 2016-02-11 MED ORDER — DIPHENHYDRAMINE HCL 50 MG/ML IJ SOLN
INTRAMUSCULAR | Status: DC | PRN
  Administered 2016-02-11: 10 mg via INTRAVENOUS

## 2016-02-11 MED ORDER — LIDOCAINE HCL (PF) 1 % IJ SOLN
INTRAMUSCULAR | Status: AC
  Filled 2016-02-11: qty 30

## 2016-02-11 MED ORDER — LACTATED RINGERS IV SOLN
INTRAVENOUS | Status: DC
  Administered 2016-02-11: 08:00:00 via INTRAVENOUS

## 2016-02-11 MED ORDER — 0.9 % SODIUM CHLORIDE (POUR BTL) OPTIME
TOPICAL | Status: DC | PRN
  Administered 2016-02-11: 1000 mL

## 2016-02-11 MED ORDER — TECHNETIUM TC 99M SULFUR COLLOID FILTERED
1.0000 | Freq: Once | INTRAVENOUS | Status: AC | PRN
  Administered 2016-02-11: 1 via INTRADERMAL

## 2016-02-11 MED ORDER — ACETAMINOPHEN 500 MG PO TABS
1000.0000 mg | ORAL_TABLET | ORAL | Status: AC
  Administered 2016-02-11: 1000 mg via ORAL
  Filled 2016-02-11: qty 2

## 2016-02-11 MED ORDER — CELECOXIB 200 MG PO CAPS
400.0000 mg | ORAL_CAPSULE | ORAL | Status: AC
  Administered 2016-02-11: 400 mg via ORAL
  Filled 2016-02-11: qty 2

## 2016-02-11 MED ORDER — DEXAMETHASONE SODIUM PHOSPHATE 10 MG/ML IJ SOLN
INTRAMUSCULAR | Status: DC | PRN
Start: 2016-02-11 — End: 2016-02-11
  Administered 2016-02-11: 4 mg via INTRAVENOUS

## 2016-02-11 MED ORDER — DEXAMETHASONE SODIUM PHOSPHATE 10 MG/ML IJ SOLN
INTRAMUSCULAR | Status: AC
  Filled 2016-02-11: qty 1

## 2016-02-11 MED ORDER — FENTANYL CITRATE (PF) 100 MCG/2ML IJ SOLN
25.0000 ug | INTRAMUSCULAR | Status: DC | PRN
  Administered 2016-02-11: 25 ug via INTRAVENOUS
  Administered 2016-02-11: 50 ug via INTRAVENOUS
  Administered 2016-02-11: 25 ug via INTRAVENOUS

## 2016-02-11 MED ORDER — SODIUM CHLORIDE 0.9 % IJ SOLN
INTRAVENOUS | Status: DC | PRN
  Administered 2016-02-11: 5 mL

## 2016-02-11 MED ORDER — PROMETHAZINE HCL 25 MG/ML IJ SOLN: 6.2500 mg | INTRAMUSCULAR | Status: DC | PRN

## 2016-02-11 MED ORDER — DIPHENHYDRAMINE HCL 50 MG/ML IJ SOLN
INTRAMUSCULAR | Status: AC
  Filled 2016-02-11: qty 1

## 2016-02-11 MED ORDER — FENTANYL CITRATE (PF) 100 MCG/2ML IJ SOLN
INTRAMUSCULAR | Status: AC
  Filled 2016-02-11: qty 2

## 2016-02-11 MED ORDER — OXYCODONE HCL 5 MG PO TABS: 5.0000 mg | ORAL_TABLET | Freq: Four times a day (QID) | ORAL | 0 refills | Status: DC | PRN

## 2016-02-11 MED ORDER — GABAPENTIN 300 MG PO CAPS
300.0000 mg | ORAL_CAPSULE | ORAL | Status: AC
  Administered 2016-02-11: 300 mg via ORAL
  Filled 2016-02-11: qty 1

## 2016-02-11 MED ORDER — SODIUM CHLORIDE 0.9 % IJ SOLN
INTRAMUSCULAR | Status: AC
  Filled 2016-02-11: qty 10

## 2016-02-11 MED ORDER — CEFAZOLIN SODIUM-DEXTROSE 2-4 GM/100ML-% IV SOLN
2.0000 g | INTRAVENOUS | Status: AC
  Administered 2016-02-11: 2 g via INTRAVENOUS
  Filled 2016-02-11: qty 100

## 2016-02-11 MED ORDER — LIDOCAINE HCL 1 % IJ SOLN
INTRAMUSCULAR | Status: DC | PRN
  Administered 2016-02-11: 10 mL

## 2016-02-11 MED ORDER — SODIUM CHLORIDE 0.9% FLUSH: 3.0000 mL | INTRAVENOUS | Status: DC | PRN

## 2016-02-11 MED ORDER — METHYLENE BLUE 0.5 % INJ SOLN
INTRAVENOUS | Status: AC
  Filled 2016-02-11: qty 10

## 2016-02-11 MED ORDER — ACETAMINOPHEN 325 MG PO TABS
650.0000 mg | ORAL_TABLET | ORAL | Status: DC | PRN
  Filled 2016-02-11: qty 2

## 2016-02-11 MED ORDER — FENTANYL CITRATE (PF) 100 MCG/2ML IJ SOLN
50.0000 ug | Freq: Once | INTRAMUSCULAR | Status: AC
  Administered 2016-02-11: 50 ug via INTRAVENOUS

## 2016-02-11 SURGICAL SUPPLY — 55 items
APPLIER CLIP 9.375 MED OPEN (MISCELLANEOUS) ×2
APR CLP MED 9.3 20 MLT OPN (MISCELLANEOUS) ×1
BINDER BREAST LRG (GAUZE/BANDAGES/DRESSINGS) ×1 IMPLANT
BINDER BREAST XLRG (GAUZE/BANDAGES/DRESSINGS) IMPLANT
BLADE SURG 15 STRL LF DISP TIS (BLADE) ×1 IMPLANT
BLADE SURG 15 STRL SS (BLADE) ×2
BNDG COHESIVE 4X5 TAN STRL (GAUZE/BANDAGES/DRESSINGS) ×2 IMPLANT
CANISTER SUCTION 2500CC (MISCELLANEOUS) ×2 IMPLANT
CHLORAPREP W/TINT 26ML (MISCELLANEOUS) ×2 IMPLANT
CLIP APPLIE 9.375 MED OPEN (MISCELLANEOUS) ×1 IMPLANT
CLIP TI LARGE 6 (CLIP) ×2 IMPLANT
CLIP TI MEDIUM 6 (CLIP) ×2 IMPLANT
CLIP TI WIDE RED SMALL 6 (CLIP) ×2 IMPLANT
COVER MAYO STAND STRL (DRAPES) ×2 IMPLANT
COVER PROBE W GEL 5X96 (DRAPES) ×2 IMPLANT
COVER SURGICAL LIGHT HANDLE (MISCELLANEOUS) ×2 IMPLANT
DRAPE CHEST BREAST 15X10 FENES (DRAPES) ×2 IMPLANT
DRAPE UNIVERSAL PACK (DRAPES) ×2 IMPLANT
DRAPE UTILITY XL STRL (DRAPES) ×4 IMPLANT
DRSG PAD ABDOMINAL 8X10 ST (GAUZE/BANDAGES/DRESSINGS) ×1 IMPLANT
ELECT CAUTERY BLADE 6.4 (BLADE) ×2 IMPLANT
ELECT REM PT RETURN 9FT ADLT (ELECTROSURGICAL) ×2
ELECTRODE REM PT RTRN 9FT ADLT (ELECTROSURGICAL) ×1 IMPLANT
GLOVE BIO SURGEON STRL SZ 6 (GLOVE) ×2 IMPLANT
GLOVE BIOGEL PI IND STRL 6.5 (GLOVE) ×1 IMPLANT
GLOVE BIOGEL PI INDICATOR 6.5 (GLOVE) ×1
GOWN STRL REUS W/ TWL LRG LVL3 (GOWN DISPOSABLE) ×1 IMPLANT
GOWN STRL REUS W/TWL 2XL LVL3 (GOWN DISPOSABLE) ×2 IMPLANT
GOWN STRL REUS W/TWL LRG LVL3 (GOWN DISPOSABLE) ×2
KIT BASIN OR (CUSTOM PROCEDURE TRAY) ×2 IMPLANT
KIT MARKER MARGIN INK (KITS) ×2 IMPLANT
LIGHT WAVEGUIDE WIDE FLAT (MISCELLANEOUS) ×1 IMPLANT
LIQUID BAND (GAUZE/BANDAGES/DRESSINGS) ×2 IMPLANT
NDL FILTER BLUNT 18X1 1/2 (NEEDLE) IMPLANT
NDL HYPO 25X1 1.5 SAFETY (NEEDLE) ×1 IMPLANT
NDL SAFETY ECLIPSE 18X1.5 (NEEDLE) IMPLANT
NEEDLE FILTER BLUNT 18X 1/2SAF (NEEDLE)
NEEDLE FILTER BLUNT 18X1 1/2 (NEEDLE) IMPLANT
NEEDLE HYPO 18GX1.5 SHARP (NEEDLE)
NEEDLE HYPO 25X1 1.5 SAFETY (NEEDLE) ×2 IMPLANT
NS IRRIG 1000ML POUR BTL (IV SOLUTION) ×2 IMPLANT
PACK SURGICAL SETUP 50X90 (CUSTOM PROCEDURE TRAY) ×2 IMPLANT
PENCIL BUTTON HOLSTER BLD 10FT (ELECTRODE) ×2 IMPLANT
SPONGE GAUZE 4X4 12PLY STER LF (GAUZE/BANDAGES/DRESSINGS) ×1 IMPLANT
SPONGE LAP 18X18 X RAY DECT (DISPOSABLE) ×2 IMPLANT
STOCKINETTE IMPERVIOUS 9X36 MD (GAUZE/BANDAGES/DRESSINGS) ×2 IMPLANT
SUT MNCRL AB 4-0 PS2 18 (SUTURE) ×2 IMPLANT
SUT VIC AB 3-0 SH 18 (SUTURE) ×2 IMPLANT
SUT VIC AB 3-0 SH 8-18 (SUTURE) ×1 IMPLANT
SYR BULB 3OZ (MISCELLANEOUS) ×2 IMPLANT
SYR CONTROL 10ML LL (SYRINGE) ×2 IMPLANT
TOWEL OR 17X24 6PK STRL BLUE (TOWEL DISPOSABLE) ×2 IMPLANT
TOWEL OR 17X26 10 PK STRL BLUE (TOWEL DISPOSABLE) ×2 IMPLANT
TUBE CONNECTING 12X1/4 (SUCTIONS) ×2 IMPLANT
YANKAUER SUCT BULB TIP NO VENT (SUCTIONS) ×2 IMPLANT

## 2016-02-11 NOTE — Progress Notes (Signed)
Dr Barry Dienes paged to inform her of neo probe in room.

## 2016-02-11 NOTE — Op Note (Signed)
Left Breast Radioactive seed localized lumpectomy and sentinel lymph node mapping and biopsy  Indications: This patient presents with history of Left screening detected breast cancer, cTis with possible microinvasionN0, upper outer quadrant.  Pre-operative Diagnosis: Left breast cancer  Post-operative Diagnosis: Same  Surgeon: [name]   Anesthesia: General endotracheal anesthesia  ASA Class: 3  Procedure Details  The patient was seen in the Holding Room. The risks, benefits, complications, treatment options, and expected outcomes were discussed with the patient. The possibilities of bleeding, infection, the need for additional procedures, failure to diagnose a condition, and creating a complication requiring transfusion or operation were discussed with the patient. The patient concurred with the proposed plan, giving informed consent.  The site of surgery properly noted/marked. The patient was taken to Operating Room # 1, identified, and the procedure verified as Left Breast Lumpectomy with sentinel lymph node biopsy. A Time Out was held and the above information confirmed.  The left arm, breast, and chest were prepped and draped in standard fashion. The lumpectomy was performed by creating a lower axillary incision Dissection was carried down to around the point of maximum signal intensity. The cautery was used to perform the dissection.  Hemostasis was achieved with cautery. The edges of the cavity were marked with large clips, with one each medial, lateral, inferior and superior, and two clips posteriorly.   The specimen was inked with the margin marker paint kit.    Specimen radiography confirmed inclusion of the mammographic lesion, the clip, and the seed.  The background signal in the breast was zero.    Using a hand-held gamma probe, left axillary sentinel nodes were identified.  Dissection was carried through the clavipectoral fascia.  Two level 2 axillary sentinel nodes were removed.   Counts per second were 210 and 20.    The background count was 9 cps.  The wound was irrigated.  Hemostasis was achieved with cautery.  The axillary incision was closed with a 3-0 vicryl deep dermal interrupted sutures and a 4-0 monocryl subcuticular closure.    Sterile dressings were applied. At the end of the operation, all sponge, instrument, and needle counts were correct.  Findings: grossly clear surgical margins and no adenopathy. Posterior margin is pectoralis.  Anterior and lateral margin skin.  Superior margin is axilla.    Estimated Blood Loss:  min         Specimens: Left breast lumpectomy and two axillary sentinel lymph nodes.             Complications:  None; patient tolerated the procedure well.         Disposition: PACU - hemodynamically stable.         Condition: stable

## 2016-02-11 NOTE — Anesthesia Postprocedure Evaluation (Signed)
Anesthesia Post Note  Patient: Paula Dawson  Procedure(s) Performed: Procedure(s) (LRB): LEFT BREAST LUMPECTOMY WITH RADIOACTIVE SEED AND LEFT SENTINEL LYMPH NODE BIOPSY (Left)  Patient location during evaluation: PACU Anesthesia Type: General and Regional Level of consciousness: awake and alert Pain management: pain level controlled Vital Signs Assessment: post-procedure vital signs reviewed and stable Respiratory status: spontaneous breathing, nonlabored ventilation and respiratory function stable Cardiovascular status: blood pressure returned to baseline and stable Postop Assessment: no signs of nausea or vomiting Anesthetic complications: no    Last Vitals:  Vitals:   02/11/16 1315 02/11/16 1330  BP:  126/78  Pulse:  72  Resp:  16  Temp: 36.3 C     Last Pain:  Vitals:   02/11/16 1230  TempSrc:   PainSc: Asleep                 Nilda Simmer

## 2016-02-11 NOTE — Anesthesia Procedure Notes (Signed)
Procedure Name: LMA Insertion Date/Time: 02/11/2016 9:34 AM Performed by: Melina Copa, Jaheem Hedgepath R Pre-anesthesia Checklist: Patient identified, Emergency Drugs available, Suction available and Patient being monitored Patient Re-evaluated:Patient Re-evaluated prior to inductionOxygen Delivery Method: Circle System Utilized Preoxygenation: Pre-oxygenation with 100% oxygen Intubation Type: IV induction Ventilation: Mask ventilation without difficulty LMA: LMA inserted LMA Size: 4.0 Number of attempts: 1 Placement Confirmation: positive ETCO2 Tube secured with: Tape Dental Injury: Teeth and Oropharynx as per pre-operative assessment

## 2016-02-11 NOTE — Transfer of Care (Signed)
Immediate Anesthesia Transfer of Care Note  Patient: Paula Dawson  Procedure(s) Performed: Procedure(s): LEFT BREAST LUMPECTOMY WITH RADIOACTIVE SEED AND LEFT SENTINEL LYMPH NODE BIOPSY (Left)  Patient Location: PACU  Anesthesia Type:General  Level of Consciousness: awake, oriented and patient cooperative  Airway & Oxygen Therapy: Patient Spontanous Breathing and Patient connected to nasal cannula oxygen  Post-op Assessment: Report given to RN, Post -op Vital signs reviewed and stable and Patient moving all extremities  Post vital signs: Reviewed and stable  Last Vitals:  Vitals:   02/11/16 0850 02/11/16 0853  BP: 136/74 138/69  Pulse: 93 92  Resp: 16 15  Temp:      Last Pain:  Vitals:   02/11/16 0809  TempSrc: Oral      Patients Stated Pain Goal: 2 ([id number])  Complications: No apparent anesthesia complications

## 2016-02-11 NOTE — Anesthesia Procedure Notes (Signed)
Anesthesia Regional Block:  Pectoralis block  Pre-Anesthetic Checklist: ,, timeout performed, Correct Patient, Correct Site, Correct Laterality, Correct Procedure, Correct Position, site marked, Risks and benefits discussed,  Surgical consent,  Pre-op evaluation,  At surgeon's request and post-op pain management  Laterality: Left  Prep: chloraprep       Needles:  Injection technique: Single-shot  Needle Type: Echogenic Stimulator Needle     Needle Length: 9cm 9 cm Needle Gauge: 21 and 21 G    Additional Needles:  Procedures: ultrasound guided (picture in chart) Pectoralis block Narrative:  Start time: 02/11/2016 8:22 AM End time: 02/11/2016 8:24 AM Injection made incrementally with aspirations every 5 mL.  Performed by: Personally  Anesthesiologist: Nilda Simmer

## 2016-02-11 NOTE — Discharge Instructions (Signed)
[facility] Office Phone Number [phone]  BREAST BIOPSY/ PARTIAL MASTECTOMY: POST OP INSTRUCTIONS  Always review your discharge instruction sheet given to you by the facility where your surgery was performed.  IF YOU HAVE DISABILITY OR FAMILY LEAVE FORMS, YOU MUST BRING THEM TO THE OFFICE FOR PROCESSING.  DO NOT GIVE THEM TO YOUR DOCTOR.  1. A prescription for pain medication may be given to you upon discharge.  Take your pain medication as prescribed, if needed.  If narcotic pain medicine is not needed, then you may take acetaminophen (Tylenol) or ibuprofen (Advil) as needed.  I will give you a medication that DOES NOT have tylenol in it.  I recommend taking two extra strength tylenol three times per day and then adding 1/2 to 2 tabs of the pain pills as needed.  All pain medication can make you sleepy, nauseated, itchy, or feel weird.  They all also make you constipated.  If you need the narcotic pain pills, I recommend taking over the counter stool softeners as well.   2. Take your usually prescribed medications unless otherwise directed 3. If you need a refill on your pain medication, please contact your pharmacy.  They will contact our office to request authorization.  Prescriptions will not be filled after 5pm or on week-ends. 4. You should eat very light the first 24 hours after surgery, such as soup, crackers, pudding, etc.  Resume your normal diet the day after surgery. 5. Most patients will experience some swelling and bruising in the breast.  Ice packs and a good support bra will help.  Swelling and bruising can take several days to resolve.  6. It is common to experience some constipation if taking pain medication after surgery.  Increasing fluid intake and taking a stool softener will usually help or prevent this problem from occurring.  A mild laxative (Milk of Magnesia or Miralax) should be taken according to package directions if there are no bowel movements after 48  hours. 7. Unless discharge instructions indicate otherwise, you may remove your bandages 48 hours after surgery, and you may shower at that time.  You may have steri-strips (small skin tapes) in place directly over the incision.  These strips should be left on the skin for 7-10 days.   Any sutures or staples will be removed at the office during your follow-up visit. 8. ACTIVITIES:  You may resume regular daily activities (gradually increasing) beginning the next day.  Wearing a good support bra or sports bra (or the breast binder) minimizes pain and swelling.  You may have sexual intercourse when it is comfortable. a. You may drive when you no longer are taking prescription pain medication, you can comfortably wear a seatbelt, and you can safely maneuver your car and apply brakes. b. RETURN TO WORK:  __________1 week_______________ 9. You should see your doctor in the office for a follow-up appointment approximately two weeks after your surgery.  Your doctors nurse will typically make your follow-up appointment when she calls you with your pathology report.  Expect your pathology report 2-3 business days after your surgery.  You may call to check if you do not hear from [place] after three days.   WHEN TO CALL YOUR DOCTOR: 1. Fever over 101.0 2. Nausea and/or vomiting. 3. Extreme swelling or bruising. 4. Continued bleeding from incision. 5. Increased pain, redness, or drainage from the incision.  The clinic staff is available to answer your questions during regular business hours.  Please dont hesitate to  call and ask to speak to one of the nurses for clinical concerns.  If you have a medical emergency, go to the nearest emergency room or call 911.  A surgeon from [facility] is always on call at the hospital.  For further questions, please visit [email or web address]

## 2016-02-11 NOTE — H&P (Signed)
Paula Dawson 01/27/2016 7:29 AM Location: [facility] Patient #: [id number] DOB: [birth year] Undefined / Language: [name] / Race: White Female   History of Present Illness [name] MD; 01/27/2016 3:26 PM) The patient is a 70 year old female who presents with breast cancer. Pt is a 70 yo F who presented with a screening detected mass. She underwent diagnostic imaging which showed 1.4 cm mass at 2 o'clock. 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 This was ER - and weakly PR +. (probably ER/PR negative.)  She has four sisters who have had breast cancer, with the youngest at age 23. She had a niece wtih breast cancer at age 76. Her dad had prostate cancer and her brother had lung cancer. She had menarche at age 7. She had 2 children, with the first at age 73. she used hormonal contraception for 12 years. She used HRT for 3-5 years. She is up to date with bone density and colonoscopy. She also has had her pap smear recently.   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   CBC and CMET essentially normal.     Review of Systems [name] MD; 01/27/2016 3:22 PM)  Note: Review of Systems Tawni Pummel RN; 01/27/2016 9:28 AM) General Present- Weight Loss. Not Present- Appetite Loss, Chills, Fatigue, Fever, Night Sweats and Weight Gain. Skin Present- Change in Wart/Mole. Not Present- Dryness, Hives, Jaundice, New Lesions, Non-Healing Wounds, Rash and Ulcer. HEENT Present- Seasonal Allergies, Visual Disturbances and Wears glasses/contact lenses. Not Present- Earache, Hearing Loss, Hoarseness, Nose Bleed, Oral Ulcers, Ringing in the Ears, Sinus Pain, Sore Throat and Yellow Eyes. Respiratory Not Present- Bloody sputum, Chronic Cough, Difficulty Breathing, Snoring and Wheezing. Breast Present- Skin Changes. Not Present- Breast Mass, Breast Pain and Nipple Discharge. Cardiovascular Not Present- Chest  Pain, Difficulty Breathing Lying Down, Leg Cramps, Palpitations, Rapid Heart Rate, Shortness of Breath and Swelling of Extremities. Gastrointestinal Present- Chronic diarrhea and Difficulty Swallowing. Not Present- Abdominal Pain, Bloating, Bloody Stool, Change in Bowel Habits, Constipation, Excessive gas, Gets full quickly at meals, Hemorrhoids, Indigestion, Nausea, Rectal Pain and Vomiting. Female Genitourinary Present- Frequency. Not Present- Nocturia, Painful Urination, Pelvic Pain and Urgency. Musculoskeletal Present- Back Pain. Not Present- Joint Pain, Joint Stiffness, Muscle Pain, Muscle Weakness and Swelling of Extremities. Neurological Not Present- Decreased Memory, Fainting, Headaches, Numbness, Seizures, Tingling, Tremor, Trouble walking and Weakness. Psychiatric Not Present- Anxiety, Bipolar, Change in Sleep Pattern, Depression, Fearful and Frequent crying. Endocrine Not Present- Cold Intolerance, Excessive Hunger, Hair Changes, Heat Intolerance, Hot flashes and New Diabetes. Hematology Not Present- Blood Thinners, Easy Bruising, Excessive bleeding, Gland problems, HIV and Persistent Infections.   Vitals [name] MD; 01/27/2016 3:25 PM) 01/27/2016 3:25 PM Weight: 136 lb Height: 62in Body Surface Area: 1.62 m Body Mass Index: 24.87 kg/m  Temp.: 98.80F  Pulse: 79 (Regular)  Resp.: 18 (Unlabored)  P.OX: 97% (Room air) BP: 141/80 (Sitting, Left Arm, Standard)       Physical Exam [name] MD; 01/27/2016 3:27 PM) General Mental Status-Alert. General Appearance-Consistent with stated age. Hydration-Well hydrated. Voice-Normal.  Head and Neck Head-normocephalic, atraumatic with no lesions or palpable masses. Trachea-midline. Thyroid Gland Characteristics - normal size and consistency.  Eye Eyeball - Bilateral-Extraocular movements intact. Sclera/Conjunctiva - Bilateral-No scleral icterus.  Chest and Lung Exam Chest and lung exam  reveals -quiet, even and easy respiratory effort with no use of accessory muscles and on auscultation, normal breath sounds, no adventitious sounds  and normal vocal resonance. Inspection Chest Wall - Normal. Back - normal.  Breast Note: breasts are dense bilaterally. minimal ptosis. She has no palpable mass. Small hematoma on left in upper outer quadrant. no nipple retraction or skin dimpling. No LAD.   Cardiovascular Cardiovascular examination reveals -normal heart sounds, regular rate and rhythm with no murmurs and normal pedal pulses bilaterally.  Abdomen Inspection Inspection of the abdomen reveals - No Hernias. Palpation/Percussion Palpation and Percussion of the abdomen reveal - Soft, Non Tender, No Rebound tenderness, No Rigidity (guarding) and No hepatosplenomegaly. Auscultation Auscultation of the abdomen reveals - Bowel sounds normal.  Neurologic Neurologic evaluation reveals -alert and oriented x 3 with no impairment of recent or remote memory. Mental Status-Normal.  Musculoskeletal Global Assessment -Note: no gross deformities.  Normal Exam - Left-Upper Extremity Strength Normal and Lower Extremity Strength Normal. Normal Exam - Right-Upper Extremity Strength Normal and Lower Extremity Strength Normal.  Lymphatic Head & Neck  General Head & Neck Lymphatics: Bilateral - Description - Normal. Axillary  General Axillary Region: Bilateral - Description - Normal. Tenderness - Non Tender. Femoral & Inguinal  Generalized Femoral & Inguinal Lymphatics: Bilateral - Description - No Generalized lymphadenopathy.    Assessment & Plan [name] MD; 01/27/2016 3:29 PM) MALIGNANT NEOPLASM OF UPPER-OUTER QUADRANT OF LEFT BREAST IN FEMALE, ESTROGEN RECEPTOR NEGATIVE (C50.412) Impression: Pt has a new diagnosis of left breast cancer, cTis with possible microinvasion. She will be referred to genetics based on family history of cancer.  We will plan breast  conservation. We will do a sentinel lymph node biopsy because of the microinvasion.  The surgical procedure was described to the patient. I discussed the incision type and location and that we would need radiology involved on with a seed marker and sentinel node.  The risks and benefits of the procedure were described to the patient and she wishes to proceed.  We discussed the risks bleeding, infection, damage to other structures, need for further procedures/surgeries. We discussed the risk of seroma. The patient was advised if the area in the breast is invasive cancer, we may need to go back to surgery for additional tissue to obtain negative margins or for a lymph node biopsy. The patient was advised that these are the most common complications, but that others can occur as well. She were advised against taking aspirin or other anti-inflammatory agents/blood thinners the week before surgery.  50 min spent in evaluation, examination, counseling, and coordination of care. >50% spent in counseling. Current Plans Referred to Genetic Counseling, for evaluation and follow up (Medical Genetics). Routine. You are being scheduled for surgery - Our schedulers will call you.  You should hear from our office's scheduling department within 5 working days about the location, date, and time of surgery. We try to make accommodations for patient's preferences in scheduling surgery, but sometimes the OR schedule or the surgeon's schedule prevents [place] from making those accommodations.  If you have not heard from our office [phone]) in 5 working days, call the office and ask for your surgeon's nurse.  If you have other questions about your diagnosis, plan, or surgery, call the office and ask for your surgeon's nurse.    Signed by [name], MD (01/27/2016 3:29 PM)

## 2016-02-11 NOTE — Interval H&P Note (Signed)
History and Physical Interval Note:  02/11/2016 7:48 AM  Paula Dawson  has presented today for surgery, with the diagnosis of LEFT BREAST CANCER  The various methods of treatment have been discussed with the patient and family. After consideration of risks, benefits and other options for treatment, the patient has consented to  Procedure(s): LEFT BREAST LUMPECTOMY WITH RADIOACTIVE SEED AND LEFT SENTINEL LYMPH NODE BIOPSY (Left) as a surgical intervention .  The patient's history has been reviewed, patient examined, no change in status, stable for surgery.  I have reviewed the patient's chart and labs.  Questions were answered to the patient's satisfaction.     [name]

## 2016-02-12 ENCOUNTER — Encounter (HOSPITAL_COMMUNITY): Payer: Self-pay | Admitting: General Surgery

## 2016-02-15 NOTE — Progress Notes (Signed)
Please let patient know margins and LN are negative.

## 2016-02-23 NOTE — Progress Notes (Signed)
Location of Breast Cancer: Left Breast Upper Outer Quadrant  Histology per Pathology Report: Diagnosis 01/15/2016: Breast, left, needle core biopsy, UOQ - HIGH GRADE DUCTAL CARCINOMA, SEE COMMENT.  Receptor Status: ER(neg), PR (), Her2-neu (weakly positive 20%), Ki-()  Did patient present with symptoms (if so, please note symptoms) or was this found on screening mammography?: Routine mammogram  Past/Anticipated interventions by surgeon, if [facility]:CHENIDPOE 02/11/2016:Dr. Dorris Fetch Byerly,MD: follow uo 02/29/16 1. Breast, lumpectomy, Left - DUCTAL CARCINOMA IN SITU WITH CALCIFICATIONS, HIGH GRADE, SPANNING 1.1 CM.- THE SURGICAL RESECTION MARGINS ARE NEGATIVE FOR CARCINOMA.- SEE ONCOLOGY TABLE BELOW. 2. Lymph node, sentinel, biopsy, Left axillary #1- THERE IS NO EVIDENCE OF CARCINOMA IN 1 OF 1 LYMPH NODE (0/1). 3. Lymph node, sentinel, biopsy, Left axillary #2- THERE IS NO EVIDENCE OF CARCINOMA IN 1 OF 1 LYMPH NODE (0/1).  Past/Anticipated interventions by medical oncology, if any: Chemotherapy Dr. Heath Lark , seen in  [facility] 01/27/2016, folllow up in December  Lymphedema issues, if any:  no Pain issues, if any:  Discomfort under left axilla where incision is, reddened, 1 steri steri strip in place  SAFETY ISSUES: no  Prior radiation? NO  Pacemaker/ICD? NO  Possible current pregnancy?NO  Is the patient on methotrexate? NO  Current Complaints / other details: menarche age 30, G69P2, first live birth age 32,,use hormonal contraception 12 years, HRT 3-5 years, non smoker,  1 sister with breast cancer, breast cancer, age 25, Brother lung cancer, father prostate cancer & liver cancer, deceased, Mother deceased age 47 dx CAD, Paternal grandfather and Maternal Grandfather both  unknown cancer, ,another brother prostate cancer,nephew colon cancer deceased    BP 131/82 (BP Location: Right Arm, Patient Position: Sitting, Cuff Size: Normal)   Pulse 69   Temp 98.3 F (36.8 C) (Oral)    Resp 16   Ht '5\' 2"'  (1.575 m)   Wt 113 lb (51.3 kg)   LMP 05/16/2000 (Approximate)   BMI 20.67 kg/m   Wt Readings from Last 3 Encounters:  02/25/16 113 lb (51.3 kg)  02/11/16 111 lb (50.3 kg)  02/09/16 111 lb (50.3 kg)   Rebecca Eaton, RN 02/23/2016,8:18 AM

## 2016-02-25 ENCOUNTER — Ambulatory Visit
Admission: RE | Admit: 2016-02-25 | Discharge: 2016-02-25 | Disposition: A | Discharge status: Home / Self Care | Payer: Medicare Other | Source: Ambulatory Visit | Attending: Radiation Oncology | Admitting: Radiation Oncology

## 2016-02-25 ENCOUNTER — Telehealth: Payer: Self-pay | Admitting: *Deleted

## 2016-02-25 ENCOUNTER — Encounter: Payer: Self-pay | Admitting: Radiation Oncology

## 2016-02-25 DIAGNOSIS — C50412 Malignant neoplasm of upper-outer quadrant of left female breast: Secondary | ICD-10-CM | POA: Diagnosis not present

## 2016-02-25 DIAGNOSIS — Z171 Estrogen receptor negative status [ER-]: Secondary | ICD-10-CM | POA: Insufficient documentation

## 2016-02-25 DIAGNOSIS — Z510 Encounter for antineoplastic radiation therapy: Secondary | ICD-10-CM | POA: Diagnosis present

## 2016-02-25 NOTE — Progress Notes (Signed)
Please see the Nurse Progress Note in the MD Initial Consult Encounter for this patient. 

## 2016-02-25 NOTE — Telephone Encounter (Signed)
Discussed with pt to leave steri strips on until they fall off. Confirmed appt with Dr. Lisbeth Renshaw today.

## 2016-02-27 NOTE — Progress Notes (Signed)
Radiation Oncology         [phone] ________________________________  Name: Paula Dawson MRN: [id number]  Date: 02/25/2016  DOB: 02-05-1946  WI:[id number] S, MD  Magrinat, Paula Dad, MD     REFERRING PHYSICIAN: Magrinat, Paula Dad, MD   DIAGNOSIS: The encounter diagnosis was Malignant neoplasm of upper-outer quadrant of left female breast, unspecified estrogen receptor status ([place]).   HISTORY OF PRESENT ILLNESS: Paula Dawson is a 70 y.o. female originally seen on 01/27/16 in the multidisciplinary breast clinic for a new diagnosis of left breast cancer. The patient had a screening mammogram which revealed calcifications. An ultrasound revelaed a 1.4 cm lesion. Her axillary nodes were negative. 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 She subsequently underwent left lumpectomy with sentinel node assessment on 02/29/16 revealing DCIS, high grade measuring 1.1 cm without invasive ductal carcinoma. Of the two lymph nodes removed, none contained disease. She comes to re-establish care in our clinic and will receive estrogen blockade following radiotherapy.    PREVIOUS RADIATION THERAPY: No   PAST MEDICAL HISTORY:  Past Medical History:  Diagnosis Date  . Anemia   . Arthritis   . Cancer [facility])    loeft breast cancer  . Dry eye syndrome   . Early cataract   . Gall stones   . GERD (gastroesophageal reflux disease)   . History of hiatal hernia   . HSV infection    pos I and II  . Kidney stones   . Macular degeneration of both eyes   . Osteopenia   . Osteoporosis 1998   Off Fosomax 2008, Prolia 03/17/11, 09/14/11, 03/16/12  . Thyroid disease age 65   hypo  . Wears glasses        PAST SURGICAL HISTORY: Past Surgical History:  Procedure Laterality Date  . AUGMENTATION MAMMAPLASTY  1981  . BREAST IMPLANT REMOVAL  1982  . BREAST LUMPECTOMY WITH RADIOACTIVE SEED AND SENTINEL LYMPH NODE BIOPSY Left 02/11/2016   Procedure: LEFT BREAST LUMPECTOMY WITH RADIOACTIVE SEED AND LEFT SENTINEL LYMPH NODE BIOPSY;  Surgeon: [name], MD;  Location: [place];  Service: General;  Laterality: Left;  . COLONOSCOPY  10/04   Dr Watt Climes  . COLONOSCOPY  04/07/08   normal - Dr. Watt Climes  . ELBOW SURGERY Left 12/1997  . ESOPHAGOGASTRODUODENOSCOPY ENDOSCOPY  08/2012   hiatak hernia with GERD  . FOOT NEUROMA SURGERY Left 2004?  . TONSILLECTOMY AND ADENOIDECTOMY    . TUBAL LIGATION  1981     FAMILY HISTORY:  Family History  Problem Relation Age of Onset  . Heart failure Mother   . Prostate cancer Father 36  . Liver cancer Father   . Colon polyps Sister   . Breast cancer Sister 75  . Breast cancer Sister 70  . Ovarian cancer Cousin     died at 55  . Breast cancer Other     X 4 + age 17, 79 with recurrence, 72, 30  . Breast cancer Sister 39    recurrence 20 yrs later  . Colon polyps Sister   . Breast cancer Sister 68  . Lung cancer Brother     smoked     SOCIAL HISTORY:  reports that she has never smoked. She has never used smokeless tobacco. She reports that she does not drink alcohol or use drugs. The patient is divorced. She resides in [place].   ALLERGIES: Vicodin [hydrocodone-acetaminophen]   MEDICATIONS:  Current Outpatient Prescriptions  Medication Sig Dispense Refill  . Ascorbic Acid Buffered (BUFFERED VITAMIN C PO) Take 750 mg by mouth daily.     Marland Kitchen aspirin 81 MG tablet Take 81 mg by mouth daily.    . B Complex-Folic Acid (B COMPLEX-VITAMIN B12 PO) Take 750 mg by mouth daily.    . carboxymethylcellulose (REFRESH PLUS) 0.5 % SOLN Place 1 drop into both eyes 3 (three) times daily as needed.    Marland Kitchen denosumab (PROLIA) 60 MG/ML SOLN injection Inject 60 mg into the skin every 6 (six) months. Administer in upper arm, thigh, or abdomen    . diphenhydrAMINE (BENADRYL) 25 mg capsule Take 25 mg by mouth daily as needed for allergies.    . Estradiol 10 MCG TABS vaginal tablet Place 1 tablet vaginally 2 (two)  times a week.    . Ferrous Sulfate (IRON) 28 MG TABS Take 28 mg by mouth daily.    . Multiple Vitamins-Minerals (MULTIVITAMIN WOMEN 50+) TABS Take 1 tablet by mouth daily.    Marland Kitchen oxyCODONE (OXY IR/ROXICODONE) 5 MG immediate release tablet Take 1-2 tablets (5-10 mg total) by mouth every 6 (six) hours as needed for moderate pain, severe pain or breakthrough pain. 15 tablet 0  . ranitidine (ZANTAC) 150 MG capsule Take 150 mg by mouth 2 (two) times daily.    . RESTASIS 0.05 % ophthalmic emulsion Place 1 drop into both eyes 2 (two) times daily.     Marland Kitchen SYNTHROID 88 MCG tablet Take 1 tablet by mouth daily.    . valACYclovir (VALTREX) 1000 MG tablet Take 500 mg by mouth daily.    . Vitamin D, Ergocalciferol, (DRISDOL) 50000 units CAPS capsule Take 1 capsule by mouth every 14 (fourteen) days.     No current facility-administered medications for this encounter.      REVIEW OF SYSTEMS: On review of systems, the patient reports that she is doing well overall. She denies any chest pain, shortness of breath, cough, fevers, chills, night sweats, unintended weight changes. She denies any bowel or bladder disturbances, and denies abdominal pain, nausea or vomiting. She denies any new musculoskeletal or joint aches or pains. A complete review of systems is obtained and is otherwise negative.     PHYSICAL EXAM:  BP 131/82 (BP Location: Right Arm, Patient Position: Sitting, Cuff Size: Normal)   Pulse 69   Temp 98.3 F (36.8 C) (Oral)   Resp 16   Ht '5\' 2"'  (1.575 m)   Wt 113 lb (51.3 kg)   LMP 05/16/2000 (Approximate)   BMI 20.67 kg/m  In general this is a well appearing caucasian female in no acute distress. She's alert and oriented x4 and appropriate throughout the examination. Cardiopulmonary assessment is negative for acute distress and she exhibits normal effort. Post surgical changes of the left breast are noted without cellulitic change.    ECOG = 0  0 - Asymptomatic (Fully active, able to carry on  all predisease activities without restriction)  1 - Symptomatic but completely ambulatory (Restricted in physically strenuous activity but ambulatory and able to carry out work of a light or sedentary nature. For example, light housework, office work)  2 - Symptomatic, <50% in bed during the day (Ambulatory and capable of all self care but unable to carry out any work activities. Up and about more than 50% of waking hours)  3 - Symptomatic, >50% in bed, but not bedbound (Capable of only limited self-care, confined to bed or chair 50% or more of waking hours)  4 -  Bedbound (Completely disabled. Cannot carry on any self-care. Totally confined to bed or chair)  5 - Death   Eustace Pen MM, Creech RH, Tormey DC, et al. [phone]). "Toxicity and response criteria of the [facility] [employer]". [place] Oncol. 5 (6): 649-55    LABORATORY DATA:  Lab Results  Component Value Date   WBC 10.9 (H) 02/09/2016   HGB 14.9 02/09/2016   HCT 44.7 02/09/2016   MCV 96.1 02/09/2016   PLT 255 02/09/2016   Lab Results  Component Value Date   NA 141 02/09/2016   K 3.7 02/09/2016   CL 108 02/09/2016   CO2 25 02/09/2016   Lab Results  Component Value Date   ALT 21 01/27/2016   AST 21 01/27/2016   ALKPHOS 93 01/27/2016   BILITOT 0.36 01/27/2016      RADIOGRAPHY: Nm Sentinel Node Inj-no Rpt (breast)  Result Date: 02/11/2016 CLINICAL DATA: left breast cancer Sulfur colloid was injected intradermally by the nuclear medicine technologist for breast cancer sentinel node localization.   Mm Breast Surgical Specimen  Result Date: 02/11/2016 CLINICAL DATA:  Patient status post left breast lumpectomy. EXAM: SPECIMEN RADIOGRAPH OF THE LEFT BREAST COMPARISON:  Previous exam(s). FINDINGS: Status post excision of the left breast. The radioactive seed and biopsy marker clip are present, completely intact, and were marked for pathology. IMPRESSION: Specimen radiograph of the left breast. Electronically  Signed   By: Lovey Newcomer M.D.   On: 02/11/2016 14:42   Mm Digital Diagnostic Unilat L  Result Date: 02/10/2016 CLINICAL DATA:  Preop relative radioactive seed localization for left breast 2 o'clock mass. EXAM: ULTRASOUND GUIDED RADIOACTIVE SEED LOCALIZATION OF THE LEFT BREAST COMPARISON:  Previous exam(s). FINDINGS: Patient presents for radioactive seed localization prior to left breast lumpectomy. I met with the patient and we discussed the procedure of seed localization including benefits and alternatives. We discussed the high likelihood of a successful procedure. We discussed the risks of the procedure including infection, bleeding, tissue injury and further surgery. We discussed the low dose of radioactivity involved in the procedure. Informed, written consent was given. The usual time-out protocol was performed immediately prior to the procedure. Using ultrasound guidance, sterile technique, 1% lidocaine a HydroMARK was placed within the lesion of question due to its subtle sonographic appearance. Two-view mammography demonstrates presence of HydroMARK within the lesion and in close proximity to the previously placed post biopsy coil shaped marker. Next, an I-125 radioactive seed was placed adjacent to the [facility] using sonographic guidance using a lateral approach. The follow-up mammogram images confirm the seed in the expected location and were marked for Dr. Barry Dienes. Follow-up survey of the patient confirms presence of the radioactive seed. Order number of I-125 seed:  [id number]. Total activity: 4.193 millicurie Reference Date: February 03, 2016 The patient tolerated the procedure well and was released from the [facility]. She was given instructions regarding seed removal. IMPRESSION: Radioactive seed localization left breast 2 o'clock mass. No apparent complications. Electronically Signed   By: Fidela Salisbury M.D.   On: 02/10/2016 17:06   [place] Lt Radioactive Seed Loc  Result Date:  02/10/2016 CLINICAL DATA:  Preop relative radioactive seed localization for left breast 2 o'clock mass. EXAM: ULTRASOUND GUIDED RADIOACTIVE SEED LOCALIZATION OF THE LEFT BREAST COMPARISON:  Previous exam(s). FINDINGS: Patient presents for radioactive seed localization prior to left breast lumpectomy. I met with the patient and we discussed the procedure of seed localization including benefits and alternatives. We discussed the high likelihood of a  successful procedure. We discussed the risks of the procedure including infection, bleeding, tissue injury and further surgery. We discussed the low dose of radioactivity involved in the procedure. Informed, written consent was given. The usual time-out protocol was performed immediately prior to the procedure. Using ultrasound guidance, sterile technique, 1% lidocaine a HydroMARK was placed within the lesion of question due to its subtle sonographic appearance. Two-view mammography demonstrates presence of HydroMARK within the lesion and in close proximity to the previously placed post biopsy coil shaped marker. Next, an I-125 radioactive seed was placed adjacent to the [facility] using sonographic guidance using a lateral approach. The follow-up mammogram images confirm the seed in the expected location and were marked for Dr. Barry Dienes. Follow-up survey of the patient confirms presence of the radioactive seed. Order number of I-125 seed:  [id number]. Total activity: 2.301 millicurie Reference Date: February 03, 2016 The patient tolerated the procedure well and was released from the [facility]. She was given instructions regarding seed removal. IMPRESSION: Radioactive seed localization left breast 2 o'clock mass. No apparent complications. Electronically Signed   By: Fidela Salisbury M.D.   On: 02/10/2016 17:06       IMPRESSION/PLAN: 1. 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 Dr. Lisbeth Renshaw discusses the surgical pathology findings and reviews the  rationale for radiotherapy and outlines that this would be followed by probable estrogen blockade. Dr. Lisbeth Renshaw recommends proceeding with 20 fractions of radiation treatment over 4 weeks to the left breast with breath hold technique. We discussed the risks, benefits, short, and long term effects of radiotherapy, and the patient is interested in proceeding. Dr. Lisbeth Renshaw discusses the delivery and logistics of radiotherapy. We have set her up for CT simulation in about one week. Written consent was obtained to move forward. 2. Possible genetic predisposition to breast cancer. The patient is meeting with genetic counseling on 03/01/16.   The above documentation reflects my direct findings during this shared patient visit. Please see the separate note by Dr. Lisbeth Renshaw on this date for the remainder of the patient's plan of care.    Carola Rhine, PAC

## 2016-03-01 ENCOUNTER — Telehealth: Payer: Self-pay | Admitting: Medical Oncology

## 2016-03-01 NOTE — Telephone Encounter (Signed)
confirmed appt. 

## 2016-03-02 ENCOUNTER — Ambulatory Visit (HOSPITAL_BASED_OUTPATIENT_CLINIC_OR_DEPARTMENT_OTHER): Payer: Medicare Other | Admitting: Genetic Counselor

## 2016-03-02 ENCOUNTER — Ambulatory Visit
Admission: RE | Admit: 2016-03-02 | Discharge: 2016-03-02 | Disposition: A | Discharge status: Home / Self Care | Payer: Medicare Other | Source: Ambulatory Visit | Attending: Radiation Oncology | Admitting: Radiation Oncology

## 2016-03-02 ENCOUNTER — Encounter: Payer: Self-pay | Admitting: Genetic Counselor

## 2016-03-02 ENCOUNTER — Other Ambulatory Visit: Payer: Medicare Other

## 2016-03-02 DIAGNOSIS — Z510 Encounter for antineoplastic radiation therapy: Secondary | ICD-10-CM | POA: Diagnosis not present

## 2016-03-02 DIAGNOSIS — Z315 Encounter for genetic counseling: Secondary | ICD-10-CM

## 2016-03-02 DIAGNOSIS — Z803 Family history of malignant neoplasm of breast: Secondary | ICD-10-CM

## 2016-03-02 DIAGNOSIS — C50412 Malignant neoplasm of upper-outer quadrant of left female breast: Secondary | ICD-10-CM

## 2016-03-02 DIAGNOSIS — Z8042 Family history of malignant neoplasm of prostate: Secondary | ICD-10-CM | POA: Diagnosis not present

## 2016-03-02 DIAGNOSIS — Z8041 Family history of malignant neoplasm of ovary: Secondary | ICD-10-CM | POA: Diagnosis not present

## 2016-03-02 DIAGNOSIS — Z171 Estrogen receptor negative status [ER-]: Principal | ICD-10-CM

## 2016-03-02 NOTE — Progress Notes (Signed)
REFERRING PROVIDER: Harlan Stains, MD [place], [place] [address]   Paula Del, MD  PRIMARY PROVIDER:  Vidal Schwalbe, MD  PRIMARY REASON FOR VISIT:  1. Malignant neoplasm of upper-outer quadrant of left female breast, unspecified estrogen receptor status ([place])   2. Family history of breast cancer   3. Family history of ovarian cancer   4. Family history of prostate cancer      HISTORY OF PRESENT ILLNESS:   Paula Dawson, a 70 y.o. female, was seen for a [place] cancer genetics consultation at the request of Dr. Jana Hakim due to a personal and family history of cancer.  Paula Dawson presents to clinic today to discuss the possibility of a hereditary predisposition to cancer, genetic testing, and to further clarify her future cancer risks, as well as potential cancer risks for family members.   In September 2017, at the age of 39, Paula Dawson was diagnosed with DCIS of the left breast. The tumor is ER-/PR equivocal. This was treated with lumpectomy and radiation.  Paula Dawson reports that several of her sisters have had breast cancer and "have the gene".  She is unable to obtain a copy of the report since they have dementia.      CANCER HISTORY:   No history exists.     HORMONAL RISK FACTORS:  Menarche was at age 68.  First live birth at age 76.  OCP use for approximately 10 years.  Ovaries intact: yes.  Hysterectomy: no.  Menopausal status: postmenopausal.  HRT use: 4 years. Colonoscopy: yes; normal. Mammogram within the last year: yes. Number of breast biopsies: 1. Up to date with pelvic exams:  yes. Any excessive radiation exposure in the past:  no  Past Medical History:  Diagnosis Date  . Anemia   . Arthritis   . Cancer [facility])    loeft breast cancer  . Dry eye syndrome   . Early cataract   . Family history of breast cancer   . Family history of ovarian cancer   . Family history of prostate cancer   . Gall stones   . GERD  (gastroesophageal reflux disease)   . History of hiatal hernia   . HSV infection    pos I and II  . Kidney stones   . Macular degeneration of both eyes   . Osteopenia   . Osteoporosis 1998   Off Fosomax 2008, Prolia 03/17/11, 09/14/11, 03/16/12  . Thyroid disease age 49   hypo  . Wears glasses     Past Surgical History:  Procedure Laterality Date  . AUGMENTATION MAMMAPLASTY  1981  . BREAST IMPLANT REMOVAL  1982  . BREAST LUMPECTOMY WITH RADIOACTIVE SEED AND SENTINEL LYMPH NODE BIOPSY Left 02/11/2016   Procedure: LEFT BREAST LUMPECTOMY WITH RADIOACTIVE SEED AND LEFT SENTINEL LYMPH NODE BIOPSY;  Surgeon: [name], MD;  Location: [place];  Service: General;  Laterality: Left;  . COLONOSCOPY  10/04   Dr Watt Climes  . COLONOSCOPY  04/07/08   normal - Dr. Watt Climes  . ELBOW SURGERY Left 12/1997  . ESOPHAGOGASTRODUODENOSCOPY ENDOSCOPY  08/2012   hiatak hernia with GERD  . FOOT NEUROMA SURGERY Left 2004?  . TONSILLECTOMY AND ADENOIDECTOMY    . TUBAL LIGATION  1981    Social History   Social History  . Marital status: Divorced    Spouse name: N/A  . Number of children: 1  . Years of education: N/A   Social History Main Topics  . Smoking status:  Never Smoker  . Smokeless tobacco: Never Used  . Alcohol use No  . Drug use: No  . Sexual activity: No   Other Topics Concern  . Not on file   Social History Narrative  . No narrative on file     FAMILY HISTORY:  We obtained a detailed, 4-generation family history.  Significant diagnoses are listed below: Family History  Problem Relation Age of Onset  . Heart failure Mother   . Prostate cancer Father 60  . Liver cancer Father   . Colon polyps Sister   . Breast cancer Sister 27  . Breast cancer Sister 61  . Ovarian cancer Cousin     died at 103  . Breast cancer Sister 54    recurrance at 57  . Breast cancer Sister 92  . Colon polyps Sister   . Breast cancer Other     X 4 + age 6, 70 with recurrence, 69, 31  . Lung cancer Brother      smoked  . Breast cancer Paternal Aunt   . Prostate cancer Brother 15  . Melanoma Brother   . Colon cancer Other 31    The patient has one son who is healthy.  She has five sisters and four brothers.  Four of the five sisters had breast cancer and reportedly three of these tested positive for "the gene".  Several nieces have developed breast cancer in their 20's-50's and one nephew had colon cancer at 54.  One brother had prostate cancer and melanoma.  The patient's parents are both deceased.  Her father had prostate cancer at 52, and her mother did not have cancer.  Her mother had 12 siblings, but the patient did not know them well.  She feels that some may have had cancer, but she is unclear which ones.  She has one cousin on the maternal side who had ovarian cancer at 48.    The patient's father had three sisters, one who possibly had breast cancer.  His father may have had cancer as well. There is no other reported family history of cancer.  Patient's maternal ancestors are of Caucasian descent, and paternal ancestors are of [place] descent. There is no reported Ashkenazi Jewish ancestry. There is no known consanguinity.  GENETIC COUNSELING ASSESSMENT: SUNSET JOSHI is a 70 y.o. female with a personal and family history of breast cancer and family history of breast, prostate, colon and ovarian cancer which is somewhat suggestive of a hereditary cancer syndrome and predisposition to cancer. We, therefore, discussed and recommended the following at today's visit.   DISCUSSION: We discussed that about 5-10% of breast cancer is hereditary, with most cases due to BRCA mutations.  The patient's reports that many of her sisters were tested prior to 2013, which makes it more suggestive that they were BRCA positive.  We discussed the importance of obtaining a copy of the report to ensure that we are testing the correct gene and that the sister's result was truly positive and not a VUS.  We also  reviewed other hereditary genes, including PALB2, CHEK2 and ATM.  We discussed that she has a possible risk on both sides of her family.  We reviewed the characteristics, features and inheritance patterns of hereditary cancer syndromes. We also discussed genetic testing, including the appropriate family members to test, the process of testing, insurance coverage and turn-around-time for results. We discussed the implications of a negative, positive and/or variant of uncertain significant result. We recommended Paula Dawson  pursue genetic testing for the Breast/Ovarian cancer gene panel. The Breast/Ovarian gene panel offered by [facility] includes sequencing and rearrangement analysis for the following 20 genes:  ATM, BARD1, BRCA1, BRCA2, BRIP1, CDH1, CHEK2, EPCAM, FANCC, MLH1, MSH2, MSH6, NBN, PALB2, PMS2, PTEN, RAD51C, RAD51D, TP53, and XRCC2.     Based on Paula Dawson personal and family history of cancer, she meets medical criteria for genetic testing. Despite that she meets criteria, she may still have an out of pocket cost. We discussed that if her out of pocket cost for testing is over $100, the laboratory will call and confirm whether she wants to proceed with testing.  If the out of pocket cost of testing is less than $100 she will be billed by the genetic testing laboratory.   PLAN: After considering the risks, benefits, and limitations, Paula Dawson  provided informed consent to pursue genetic testing and the blood sample was sent to [facility] for analysis of the Breast/Ovarian cancer panel. Results should be available within approximately 2-3 weeks' time, at which point they will be disclosed by telephone to Paula Dawson, as will any additional recommendations warranted by these results. Paula Dawson will receive a summary of her genetic counseling visit and a copy of her results once available. This information will also be available in Epic. We encouraged Paula Dawson to remain in  contact with cancer genetics annually so that we can continuously update the family history and inform her of any changes in cancer genetics and testing that may be of benefit for her family. Paula Dawson questions were answered to her satisfaction today. Our contact information was provided should additional questions or concerns arise.  Lastly, we encouraged Paula Dawson to remain in contact with cancer genetics annually so that we can continuously update the family history and inform her of any changes in cancer genetics and testing that may be of benefit for this family.   Ms.  Dawson questions were answered to her satisfaction today. Our contact information was provided should additional questions or concerns arise. Thank you for the referral and allowing [place] to share in the care of your patient.   Tija Biss P. [place], [place], [facility] Certified Genetic Counselor [email or web address] phone: [phone]  The patient was seen for a total of 45 minutes in face-to-face genetic counseling.  This patient was discussed with Drs. Magrinat, Lindi Adie and/or Burr Medico who agrees with the above.    _______________________________________________________________________ For Office Staff:  Number of people involved in session: 1 Was an Intern/ student involved with case: yes [employer]

## 2016-03-04 DIAGNOSIS — Z510 Encounter for antineoplastic radiation therapy: Secondary | ICD-10-CM | POA: Diagnosis not present

## 2016-03-04 NOTE — Progress Notes (Signed)
  Radiation Oncology         [phone] ________________________________  Name: Paula Dawson MRN: [id number]  Date: 03/02/2016  DOB: October 29, 1945  Optical Surface Tracking Plan:  Since intensity modulated radiotherapy (IMRT) and 3D conformal radiation treatment methods are predicated on accurate and precise positioning for treatment, intrafraction motion monitoring is medically necessary to ensure accurate and safe treatment delivery.  The ability to quantify intrafraction motion without excessive ionizing radiation dose can only be performed with optical surface tracking. Accordingly, surface imaging offers the opportunity to obtain 3D measurements of patient position throughout IMRT and 3D treatments without excessive radiation exposure.  I am ordering optical surface tracking for this patient's upcoming course of radiotherapy. ________________________________  Kyung Rudd, MD 03/04/2016 7:50 AM    Reference:   Particia Jasper, et al. Surface imaging-based analysis of intrafraction motion for breast radiotherapy patients.Journal of [place], n. 6, nov. 2014. ISSN [id number].   Available at: <http://www.jacmp.org/index.php/jacmp/article/view/4957>.

## 2016-03-04 NOTE — Progress Notes (Signed)
  Radiation Oncology         [phone] ________________________________  Name: Paula Dawson MRN: [id number]  Date: 03/02/2016  DOB: [birth year]   DIAGNOSIS:     ICD-9-CM ICD-10-CM   1. Malignant neoplasm of upper-outer quadrant of left breast in female, estrogen receptor negative ([place]) 174.4 C50.412    V86.1 Z17.1     SIMULATION AND TREATMENT PLANNING NOTE  The patient presented for simulation prior to beginning her course of radiation treatment for her diagnosis of Left-sided breast cancer. The patient was placed in a supine position on a breast board. A customized vac-lock bag was constructed and this complex treatment device will be used on a daily basis during her treatment. In this fashion, a CT scan was obtained through the chest area and an isocenter was placed near the chest wall within the breast.  The patient will be planned to receive a course of radiation initially to a dose of 42.5 Gy. This will consist of a whole breast radiotherapy technique. To accomplish this, 2 customized blocks have been designed which will correspond to medial and lateral whole breast tangent fields. This treatment will be accomplished at 2.5 Gy per fraction. A forward planning technique will also be evaluated to determine if this approach improves the plan. It is anticipated that the patient will then receive a 7.5 Gy boost to the seroma cavity which has been contoured. This will be accomplished at 2.5 Gy per fraction.   This initial treatment will consist of a 3-D conformal technique. The seroma has been contoured as the primary target structure. Additionally, dose volume histograms of both this target as well as the lungs and heart will also be evaluated. Such an approach is necessary to ensure that the target area is adequately covered while the nearby critical  normal structures are adequately spared.  Plan:  The final anticipated total dose therefore will correspond to 50  Gy.    _______________________________   Jodelle Gross, MD, PhD

## 2016-03-09 ENCOUNTER — Ambulatory Visit
Admission: RE | Admit: 2016-03-09 | Discharge: 2016-03-09 | Disposition: A | Discharge status: Home / Self Care | Payer: Medicare Other | Source: Ambulatory Visit | Attending: Radiation Oncology | Admitting: Radiation Oncology

## 2016-03-09 DIAGNOSIS — Z510 Encounter for antineoplastic radiation therapy: Secondary | ICD-10-CM | POA: Diagnosis not present

## 2016-03-10 ENCOUNTER — Ambulatory Visit
Admission: RE | Admit: 2016-03-10 | Discharge: 2016-03-10 | Disposition: A | Discharge status: Home / Self Care | Payer: Medicare Other | Source: Ambulatory Visit | Attending: Radiation Oncology | Admitting: Radiation Oncology

## 2016-03-10 DIAGNOSIS — Z510 Encounter for antineoplastic radiation therapy: Secondary | ICD-10-CM | POA: Diagnosis not present

## 2016-03-10 DIAGNOSIS — C50412 Malignant neoplasm of upper-outer quadrant of left female breast: Secondary | ICD-10-CM | POA: Insufficient documentation

## 2016-03-10 MED ORDER — RADIAPLEXRX EX GEL
Freq: Once | CUTANEOUS | Status: AC
  Administered 2016-03-10: 10:00:00 via TOPICAL

## 2016-03-10 MED ORDER — ALRA NON-METALLIC DEODORANT (RAD-ONC)
1.0000 "application " | Freq: Once | TOPICAL | Status: AC
  Administered 2016-03-10: 1 via TOPICAL

## 2016-03-10 NOTE — Progress Notes (Signed)
patient education, breast, radiation  And therapy and you book, my business card, alra deodorant,radiaplex  Gel, discusses side effects, fatigue, skin irritation,pain, swelling,tenderness of breast , use electric shaver when shaving, no under wire bras if possible, use cream daily after rad tx and bedtime, increase protein in diet, stay hydrated,drink plenty fluids, sees RN and MD weekly and prn, verbal understanding, ,teach back 9:51 AM

## 2016-03-11 ENCOUNTER — Ambulatory Visit
Admission: RE | Admit: 2016-03-11 | Discharge: 2016-03-11 | Disposition: A | Discharge status: Home / Self Care | Payer: Medicare Other | Source: Ambulatory Visit | Attending: Radiation Oncology | Admitting: Radiation Oncology

## 2016-03-11 ENCOUNTER — Encounter: Payer: Self-pay | Admitting: Radiation Oncology

## 2016-03-11 VITALS — BP 125/68 | HR 67 | Temp 97.9°F | Resp 16 | Wt 112.4 lb

## 2016-03-11 DIAGNOSIS — C50412 Malignant neoplasm of upper-outer quadrant of left female breast: Secondary | ICD-10-CM

## 2016-03-11 DIAGNOSIS — Z510 Encounter for antineoplastic radiation therapy: Secondary | ICD-10-CM | POA: Diagnosis not present

## 2016-03-11 NOTE — Progress Notes (Signed)
Department of Radiation Oncology  Phone:  [phone] Fax:        [phone]  Weekly Treatment Note    Name: Paula Dawson Date: 03/18/2016 MRN: [id number] DOB: 15-Jul-1945   Diagnosis:     ICD-9-CM ICD-10-CM   1. Malignant neoplasm of upper-outer quadrant of left female breast, unspecified estrogen receptor status ([place]) 174.4 C50.412      Current dose: 5 Gy  Current fraction: 2   MEDICATIONS: Current Outpatient Prescriptions  Medication Sig Dispense Refill  . Ascorbic Acid (VITAMIN C) 500 MG CAPS Take 1 capsule by mouth daily.    . Ascorbic Acid Buffered (BUFFERED VITAMIN C PO) Take 750 mg by mouth daily.     Marland Kitchen aspirin 81 MG tablet Take 81 mg by mouth daily.    . B Complex-Folic Acid (B COMPLEX-VITAMIN B12 PO) Take 750 mg by mouth daily.    . carboxymethylcellulose (REFRESH PLUS) 0.5 % SOLN Place 1 drop into both eyes 3 (three) times daily as needed.    . Cholecalciferol (VITAMIN D3) 50000 units CAPS Take 50,000 Units by mouth once a week.    . denosumab (PROLIA) 60 MG/ML SOLN injection Inject 60 mg into the skin every 6 (six) months. Administer in upper arm, thigh, or abdomen    . diphenhydrAMINE (BENADRYL) 25 mg capsule Take 25 mg by mouth daily as needed for allergies.    . Ferrous Sulfate (IRON) 28 MG TABS Take 28 mg by mouth daily.    . hyaluronate sodium (RADIAPLEXRX) GEL Apply 1 application topically 2 (two) times daily.    . Multiple Vitamins-Minerals (MULTIVITAMIN WOMEN 50+) TABS Take 1 tablet by mouth daily.    . non-metallic deodorant Jethro Poling) MISC Apply 1 application topically daily as needed.    . NUTRITIONAL SUPPLEMENTS PO Take 1 each by mouth daily. aerads 2 vitamins    . ranitidine (ZANTAC) 150 MG capsule Take 150 mg by mouth 2 (two) times daily.    . RESTASIS 0.05 % ophthalmic emulsion Place 1 drop into both eyes 2 (two) times daily.     Marland Kitchen SYNTHROID 88 MCG tablet Take 1 tablet by mouth daily.    . valACYclovir (VALTREX) 1000 MG tablet Take 500 mg by  mouth daily.    . Vitamin D, Ergocalciferol, (DRISDOL) 50000 units CAPS capsule Take 1 capsule by mouth every 14 (fourteen) days.    . Calcium Citrate (CAL-CITRATE) 150 MG CAPS Take 150 mg by mouth daily.    . Estradiol 10 MCG TABS vaginal tablet Place 1 tablet vaginally 2 (two) times a week.    . Flaxseed, Linseed, (FLAX SEED OIL) 1000 MG CAPS Take 1 tablet by mouth daily.    . Multiple Vitamin (MULTIVITAMIN) capsule Take 1 capsule by mouth daily.    Marland Kitchen oxyCODONE (OXY IR/ROXICODONE) 5 MG immediate release tablet Take 1-2 tablets (5-10 mg total) by mouth every 6 (six) hours as needed for moderate pain, severe pain or breakthrough pain. (Patient not taking: Reported on 03/11/2016) 15 tablet 0   No current facility-administered medications for this encounter.      ALLERGIES: Vicodin [hydrocodone-acetaminophen]   LABORATORY DATA:  Lab Results  Component Value Date   WBC 10.9 (H) 02/09/2016   HGB 14.9 02/09/2016   HCT 44.7 02/09/2016   MCV 96.1 02/09/2016   PLT 255 02/09/2016   Lab Results  Component Value Date   NA 141 02/09/2016   K 3.7 02/09/2016   CL 108 02/09/2016   CO2 25 02/09/2016   Lab  Results  Component Value Date   ALT 21 01/27/2016   AST 21 01/27/2016   ALKPHOS 93 01/27/2016   BILITOT 0.36 01/27/2016     NARRATIVE: Paula Dawson was seen today for weekly treatment management. The chart was checked and the patient's films were reviewed.  Weekly radt xs left breast 2/20 left breast. Hs radaiplex to start using bid, no c/o pain, appetite good.  PHYSICAL EXAMINATION: weight is 112 lb 6.4 oz (51 kg). Her oral temperature is 97.9 F (36.6 C). Her blood pressure is 125/68 and her pulse is 67. Her respiration is 16.   No breast examination done in light of beginning radiation.  ASSESSMENT: The patient is doing satisfactorily with treatment.  PLAN: We will continue with the patient's radiation treatment as  planned.  ------------------------------------------------  Jodelle Gross, MD, PhD  This document serves as a record of services personally performed by Kyung Rudd, MD. It was created on his behalf by Darcus Austin, a trained medical scribe. The creation of this record is based on the scribe's personal observations and the provider's statements to them. This document has been checked and approved by the attending provider.

## 2016-03-11 NOTE — Progress Notes (Signed)
Weekly radt xs left breast 2/17  Left breast completed, pt educatin done on 03/10/16, has radaiplex to start using bid, no c/o pain, appetite good 9:26 AM BP 125/68 (BP Location: Right Arm, Patient Position: Sitting, Cuff Size: Normal)   Pulse 67   Temp 97.9 F (36.6 C) (Oral)   Resp 16   Wt 112 lb 6.4 oz (51 kg)   LMP 05/16/2000 (Approximate)   BMI 20.56 kg/m   Wt Readings from Last 3 Encounters:  03/11/16 112 lb 6.4 oz (51 kg)  02/25/16 113 lb (51.3 kg)  02/11/16 111 lb (50.3 kg)   9:26 AM

## 2016-03-14 ENCOUNTER — Ambulatory Visit
Admission: RE | Admit: 2016-03-14 | Discharge: 2016-03-14 | Disposition: A | Discharge status: Home / Self Care | Payer: Medicare Other | Source: Ambulatory Visit | Attending: Radiation Oncology | Admitting: Radiation Oncology

## 2016-03-14 ENCOUNTER — Ambulatory Visit: Payer: Medicare Other | Admitting: Internal Medicine

## 2016-03-14 DIAGNOSIS — Z510 Encounter for antineoplastic radiation therapy: Secondary | ICD-10-CM | POA: Diagnosis not present

## 2016-03-15 ENCOUNTER — Ambulatory Visit
Admission: RE | Admit: 2016-03-15 | Discharge: 2016-03-15 | Disposition: A | Discharge status: Home / Self Care | Payer: Medicare Other | Source: Ambulatory Visit | Attending: Radiation Oncology | Admitting: Radiation Oncology

## 2016-03-15 DIAGNOSIS — Z510 Encounter for antineoplastic radiation therapy: Secondary | ICD-10-CM | POA: Diagnosis not present

## 2016-03-16 ENCOUNTER — Ambulatory Visit
Admission: RE | Admit: 2016-03-16 | Discharge: 2016-03-16 | Disposition: A | Discharge status: Home / Self Care | Payer: Medicare Other | Source: Ambulatory Visit | Attending: Radiation Oncology | Admitting: Radiation Oncology

## 2016-03-16 DIAGNOSIS — Z510 Encounter for antineoplastic radiation therapy: Secondary | ICD-10-CM | POA: Diagnosis not present

## 2016-03-16 DIAGNOSIS — Z1371 Encounter for nonprocreative screening for genetic disease carrier status: Secondary | ICD-10-CM

## 2016-03-16 HISTORY — DX: Encounter for nonprocreative screening for genetic disease carrier status: Z13.71

## 2016-03-17 ENCOUNTER — Ambulatory Visit
Admission: RE | Admit: 2016-03-17 | Discharge: 2016-03-17 | Disposition: A | Discharge status: Home / Self Care | Payer: Medicare Other | Source: Ambulatory Visit | Attending: Radiation Oncology | Admitting: Radiation Oncology

## 2016-03-17 ENCOUNTER — Telehealth: Payer: Self-pay | Admitting: *Deleted

## 2016-03-17 DIAGNOSIS — Z510 Encounter for antineoplastic radiation therapy: Secondary | ICD-10-CM | POA: Diagnosis not present

## 2016-03-17 NOTE — Telephone Encounter (Signed)
  Oncology Nurse Navigator Documentation  Navigator Location: [facility] (03/17/16 1200)   )Navigator Encounter Type: Telephone (03/17/16 1200) Telephone: Paula Dawson Call (03/17/16 1200)                   Patient Visit Type: [id number] (03/17/16 1200) Treatment Phase: First Radiation Tx (03/17/16 1200)                            Time Spent with Patient: 15 (03/17/16 1200)

## 2016-03-18 ENCOUNTER — Encounter: Payer: Self-pay | Admitting: Radiation Oncology

## 2016-03-18 ENCOUNTER — Ambulatory Visit
Admission: RE | Admit: 2016-03-18 | Discharge: 2016-03-18 | Disposition: A | Discharge status: Home / Self Care | Payer: Medicare Other | Source: Ambulatory Visit | Attending: Radiation Oncology | Admitting: Radiation Oncology

## 2016-03-18 VITALS — BP 125/79 | HR 67 | Temp 98.0°F | Resp 16 | Wt 113.6 lb

## 2016-03-18 DIAGNOSIS — C50412 Malignant neoplasm of upper-outer quadrant of left female breast: Secondary | ICD-10-CM

## 2016-03-18 DIAGNOSIS — Z510 Encounter for antineoplastic radiation therapy: Secondary | ICD-10-CM | POA: Diagnosis not present

## 2016-03-18 NOTE — Progress Notes (Signed)
Weekly rad txs  Left breast 7/17 completed, dermatitis rash mid chest breast are,  Skin intct, using radiaplex, c/o itching  And mild discomfort after radiation,resolves on its own soon 9:27 AM BP 125/79 (BP Location: Right Arm, Patient Position: Sitting, Cuff Size: Normal)   Pulse 67   Temp 98 F (36.7 C) (Oral)   Resp 16   Wt 113 lb 9.6 oz (51.5 kg)   LMP 05/16/2000 (Approximate)   BMI 20.78 kg/m   Wt Readings from Last 3 Encounters:  03/18/16 113 lb 9.6 oz (51.5 kg)  03/11/16 112 lb 6.4 oz (51 kg)  02/25/16 113 lb (51.3 kg)

## 2016-03-18 NOTE — Progress Notes (Signed)
Department of Radiation Oncology  Phone:  [phone] Fax:        [phone]  Weekly Treatment Note    Name: Paula Dawson Date: 03/18/2016 MRN: [id number] DOB: Aug 19, 1945   Diagnosis:     ICD-9-CM ICD-10-CM   1. Malignant neoplasm of upper-outer quadrant of left female breast, unspecified estrogen receptor status ([facility]) 174.4 C50.412      Current dose: 17.5 Gy  Current fraction: 7   MEDICATIONS: Current Outpatient Prescriptions  Medication Sig Dispense Refill  . Ascorbic Acid (VITAMIN C) 500 MG CAPS Take 1 capsule by mouth daily.    . Ascorbic Acid Buffered (BUFFERED VITAMIN C PO) Take 750 mg by mouth daily.     Marland Kitchen aspirin 81 MG tablet Take 81 mg by mouth daily.    . B Complex-Folic Acid (B COMPLEX-VITAMIN B12 PO) Take 750 mg by mouth daily.    . Calcium Citrate (CAL-CITRATE) 150 MG CAPS Take 150 mg by mouth daily.    . carboxymethylcellulose (REFRESH PLUS) 0.5 % SOLN Place 1 drop into both eyes 3 (three) times daily as needed.    . Cholecalciferol (VITAMIN D3) 50000 units CAPS Take 50,000 Units by mouth once a week.    . denosumab (PROLIA) 60 MG/ML SOLN injection Inject 60 mg into the skin every 6 (six) months. Administer in upper arm, thigh, or abdomen    . diphenhydrAMINE (BENADRYL) 25 mg capsule Take 25 mg by mouth daily as needed for allergies.    . Estradiol 10 MCG TABS vaginal tablet Place 1 tablet vaginally 2 (two) times a week.    . Ferrous Sulfate (IRON) 28 MG TABS Take 28 mg by mouth daily.    . Flaxseed, Linseed, (FLAX SEED OIL) 1000 MG CAPS Take 1 tablet by mouth daily.    . hyaluronate sodium (RADIAPLEXRX) GEL Apply 1 application topically 2 (two) times daily.    . Multiple Vitamin (MULTIVITAMIN) capsule Take 1 capsule by mouth daily.    . Multiple Vitamins-Minerals (MULTIVITAMIN WOMEN 50+) TABS Take 1 tablet by mouth daily.    . non-metallic deodorant Jethro Poling) MISC Apply 1 application topically daily as needed.    . NUTRITIONAL SUPPLEMENTS PO Take 1  each by mouth daily. aerads 2 vitamins    . oxyCODONE (OXY IR/ROXICODONE) 5 MG immediate release tablet Take 1-2 tablets (5-10 mg total) by mouth every 6 (six) hours as needed for moderate pain, severe pain or breakthrough pain. 15 tablet 0  . ranitidine (ZANTAC) 150 MG capsule Take 150 mg by mouth 2 (two) times daily.    . RESTASIS 0.05 % ophthalmic emulsion Place 1 drop into both eyes 2 (two) times daily.     Marland Kitchen SYNTHROID 88 MCG tablet Take 1 tablet by mouth daily.    . valACYclovir (VALTREX) 1000 MG tablet Take 500 mg by mouth daily.    . Vitamin D, Ergocalciferol, (DRISDOL) 50000 units CAPS capsule Take 1 capsule by mouth every 14 (fourteen) days.     No current facility-administered medications for this encounter.      ALLERGIES: Vicodin [hydrocodone-acetaminophen]   LABORATORY DATA:  Lab Results  Component Value Date   WBC 10.9 (H) 02/09/2016   HGB 14.9 02/09/2016   HCT 44.7 02/09/2016   MCV 96.1 02/09/2016   PLT 255 02/09/2016   Lab Results  Component Value Date   NA 141 02/09/2016   K 3.7 02/09/2016   CL 108 02/09/2016   CO2 25 02/09/2016   Lab Results  Component Value Date  ALT 21 01/27/2016   AST 21 01/27/2016   ALKPHOS 93 01/27/2016   BILITOT 0.36 01/27/2016     NARRATIVE: Paula Dawson was seen today for weekly treatment management. The chart was checked and the patient's films were reviewed.  Weekly rad txs left breast 7 completed. Dermatitis rash mid chest breast, skin intct, using radiaplex. C/o itching and mild discomfort after radiation, resolves on its own afterwards.  PHYSICAL EXAMINATION: weight is 113 lb 9.6 oz (51.5 kg). Her oral temperature is 98 F (36.7 C). Her blood pressure is 125/79 and her pulse is 67. Her respiration is 16.   Mild erythema in the treatment area with some dermatitis in the upper inner aspect of the treatment area.  ASSESSMENT: The patient is doing satisfactorily with treatment.  PLAN: We will continue with the  patient's radiation treatment as planned.  ------------------------------------------------  Paula Gross, MD, PhD  This document serves as a record of services personally performed by Paula Rudd, MD. It was created on his behalf by Paula Dawson, a trained medical scribe. The creation of this record is based on the scribe's personal observations and the provider's statements to them. This document has been checked and approved by the attending provider.

## 2016-03-21 ENCOUNTER — Ambulatory Visit
Admission: RE | Admit: 2016-03-21 | Discharge: 2016-03-21 | Disposition: A | Discharge status: Home / Self Care | Payer: Medicare Other | Source: Ambulatory Visit | Attending: Radiation Oncology | Admitting: Radiation Oncology

## 2016-03-21 DIAGNOSIS — Z510 Encounter for antineoplastic radiation therapy: Secondary | ICD-10-CM | POA: Diagnosis not present

## 2016-03-22 ENCOUNTER — Telehealth: Payer: Self-pay | Admitting: Genetic Counselor

## 2016-03-22 ENCOUNTER — Encounter: Payer: Self-pay | Admitting: Genetic Counselor

## 2016-03-22 ENCOUNTER — Ambulatory Visit: Payer: Self-pay | Admitting: Genetic Counselor

## 2016-03-22 ENCOUNTER — Ambulatory Visit
Admission: RE | Admit: 2016-03-22 | Discharge: 2016-03-22 | Disposition: A | Discharge status: Home / Self Care | Payer: Medicare Other | Source: Ambulatory Visit | Attending: Radiation Oncology | Admitting: Radiation Oncology

## 2016-03-22 DIAGNOSIS — C50412 Malignant neoplasm of upper-outer quadrant of left female breast: Secondary | ICD-10-CM

## 2016-03-22 DIAGNOSIS — Z1379 Encounter for other screening for genetic and chromosomal anomalies: Secondary | ICD-10-CM

## 2016-03-22 DIAGNOSIS — Z8041 Family history of malignant neoplasm of ovary: Secondary | ICD-10-CM

## 2016-03-22 DIAGNOSIS — Z803 Family history of malignant neoplasm of breast: Secondary | ICD-10-CM

## 2016-03-22 DIAGNOSIS — Z510 Encounter for antineoplastic radiation therapy: Secondary | ICD-10-CM | POA: Diagnosis not present

## 2016-03-22 DIAGNOSIS — Z8042 Family history of malignant neoplasm of prostate: Secondary | ICD-10-CM

## 2016-03-22 NOTE — Progress Notes (Signed)
HPI: Ms. Stembridge was previously seen in the [facility] due to a personal and family history of cancer and concerns regarding a hereditary predisposition to cancer. Please refer to our prior cancer genetics clinic note for more information regarding Ms. Latimore medical, social and family histories, and our assessment and recommendations, at the time. Ms. Koffman recent genetic test results were disclosed to her, as were recommendations warranted by these results. These results and recommendations are discussed in more detail below.  FAMILY HISTORY:  We obtained a detailed, 4-generation family history.  Significant diagnoses are listed below: Family History  Problem Relation Age of Onset  . Heart failure Mother   . Prostate cancer Father 67  . Liver cancer Father   . Colon polyps Sister   . Breast cancer Sister 75  . Breast cancer Sister 80  . Ovarian cancer Cousin     died at 54  . Breast cancer Sister 69    recurrance at 53  . Breast cancer Sister 32  . Colon polyps Sister   . Breast cancer Other     X 4 + age 90, 35 with recurrence, 53, 67  . Lung cancer Brother     smoked  . Breast cancer Paternal Aunt   . Prostate cancer Brother 37  . Melanoma Brother   . Colon cancer Other 55    The patient has one son who is healthy.  She has five sisters and four brothers.  Four of the five sisters had breast cancer and reportedly three of these tested positive for "the gene".  Several nieces have developed breast cancer in their 14's-50's and one nephew had colon cancer at 59.  One brother had prostate cancer and melanoma.  The patient's parents are both deceased.  Her father had prostate cancer at 3, and her mother did not have cancer.  Her mother had 12 siblings, but the patient did not know them well.  She feels that some may have had cancer, but she is unclear which ones.  She has one cousin on the maternal side who had ovarian cancer at 29.    The  patient's father had three sisters, one who possibly had breast cancer.  His father may have had cancer as well. There is no other reported family history of cancer.  Patient's maternal ancestors are of Caucasian descent, and paternal ancestors are of [place] descent. There is no reported Ashkenazi Jewish ancestry. There is no known consanguinity.  GENETIC TEST RESULTS: At the time of Ms. Nishida visit, we recommended she pursue genetic testing of the Breast/Ovarian cancer gene panel. The Breast/Ovarian gene panel offered by [facility] includes sequencing and rearrangement analysis for the following 20 genes:  ATM, BARD1, BRCA1, BRCA2, BRIP1, CDH1, CHEK2, EPCAM, FANCC, MLH1, MSH2, MSH6, NBN, PALB2, PMS2, PTEN, RAD51C, RAD51D, TP53, and XRCC2.   The report date is March 21, 2016.  Genetic testing was normal, and did not reveal a deleterious mutation in these genes. The test report has been scanned into EPIC and is located under the Molecular Pathology section of the Results Review tab.   We discussed with Ms. Barlett that since the current genetic testing is not perfect, it is possible there may be a gene mutation in one of these genes that current testing cannot detect, but that chance is small. We also discussed, that it is possible that another gene that has not yet been discovered, or that we have not yet tested, is responsible for the  cancer diagnoses in the family, and it is, therefore, important to remain in touch with cancer genetics in the future so that we can continue to offer Ms. Mckiver the most up to date genetic testing.   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  At this time, it is unknown if this variant is associated with increased cancer risk or if this is a normal finding, but most variants such as this get reclassified to being inconsequential. It should not be used to make medical management decisions. With time, we suspect the  lab will determine the significance of this variant, if any. If we do learn more about it, we will try to contact Ms. Monahan to discuss it further. However, it is important to stay in touch with [place] periodically and keep the address and phone number up to date.   CANCER SCREENING RECOMMENDATIONS: Given Ms. Theilen personal and family histories, we must interpret these negative results with some caution.  Families with features suggestive of hereditary risk for cancer tend to have multiple family members with cancer, diagnoses in multiple generations and diagnoses before the age of 61. Ms. Stenseth family exhibits some of these features. Thus this result may simply reflect our current inability to detect all mutations within these genes or there may be a different gene that has not yet been discovered or tested.   RECOMMENDATIONS FOR FAMILY MEMBERS: Women in this family might be at some increased risk of developing cancer, over the general population risk, simply due to the family history of cancer. We recommended women in this family have a yearly mammogram beginning at age 74, or 31 years younger than the earliest onset of cancer, an annual clinical breast exam, and perform monthly breast self-exams. Women in this family should also have a gynecological exam as recommended by their primary provider. All family members should have a colonoscopy by age 42.  Based on Ms. Armendarez family history, we recommended her sisters or her nieces, who was diagnosed with breast cancer before age 34, have genetic counseling and testing. Ms. Braun will let us know if we can be of any assistance in coordinating genetic counseling and/or testing for this family member.   FOLLOW-UP: Lastly, we discussed with Ms. Hovanec that cancer genetics is a rapidly advancing field and it is possible that new genetic tests will be appropriate for her and/or her family members in the future. We encouraged her to remain  in contact with cancer genetics on an annual basis so we can update her personal and family histories and let her know of advances in cancer genetics that may benefit this family.   Our contact number was provided. Ms. Gronau questions were answered to her satisfaction, and she knows she is welcome to call us at anytime with additional questions or concerns.   Roma Kayser, MS, [facility] Certified Genetic Counselor [email or web address]

## 2016-03-22 NOTE — Telephone Encounter (Signed)
Revealed negative genetic testing on the Breast/Ovarian cancer panel.  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  We will let her know when this is reclassified.

## 2016-03-23 ENCOUNTER — Ambulatory Visit
Admission: RE | Admit: 2016-03-23 | Discharge: 2016-03-23 | Disposition: A | Discharge status: Home / Self Care | Payer: Medicare Other | Source: Ambulatory Visit | Attending: Radiation Oncology | Admitting: Radiation Oncology

## 2016-03-23 DIAGNOSIS — Z510 Encounter for antineoplastic radiation therapy: Secondary | ICD-10-CM | POA: Diagnosis not present

## 2016-03-24 ENCOUNTER — Ambulatory Visit
Admission: RE | Admit: 2016-03-24 | Discharge: 2016-03-24 | Disposition: A | Discharge status: Home / Self Care | Payer: Medicare Other | Source: Ambulatory Visit | Attending: Radiation Oncology | Admitting: Radiation Oncology

## 2016-03-24 DIAGNOSIS — Z510 Encounter for antineoplastic radiation therapy: Secondary | ICD-10-CM | POA: Diagnosis not present

## 2016-03-25 ENCOUNTER — Ambulatory Visit
Admission: RE | Admit: 2016-03-25 | Discharge: 2016-03-25 | Disposition: A | Discharge status: Home / Self Care | Payer: Medicare Other | Source: Ambulatory Visit | Attending: Radiation Oncology | Admitting: Radiation Oncology

## 2016-03-25 DIAGNOSIS — Z510 Encounter for antineoplastic radiation therapy: Secondary | ICD-10-CM | POA: Diagnosis not present

## 2016-03-28 ENCOUNTER — Encounter: Payer: Self-pay | Admitting: Radiation Oncology

## 2016-03-28 ENCOUNTER — Ambulatory Visit
Admission: RE | Admit: 2016-03-28 | Discharge: 2016-03-28 | Disposition: A | Discharge status: Home / Self Care | Payer: Medicare Other | Source: Ambulatory Visit | Attending: Radiation Oncology | Admitting: Radiation Oncology

## 2016-03-28 VITALS — BP 129/80 | HR 63 | Temp 97.8°F | Resp 16 | Wt 112.0 lb

## 2016-03-28 DIAGNOSIS — Z510 Encounter for antineoplastic radiation therapy: Secondary | ICD-10-CM | POA: Diagnosis not present

## 2016-03-28 DIAGNOSIS — C50412 Malignant neoplasm of upper-outer quadrant of left female breast: Secondary | ICD-10-CM

## 2016-03-28 NOTE — Progress Notes (Signed)
Weekly rad txs left breast 13/ completed, rasdh on chest/breat area  Using hydrocortisone cream and radiaplex bid, very little itching stated, no fatiue, appetite good 9:34 AM BP 129/80 (BP Location: Right Arm, Patient Position: Sitting, Cuff Size: Normal)   Pulse 63   Temp 97.8 F (36.6 C) (Oral)   Resp 16   Wt 112 lb (50.8 kg)   LMP 05/16/2000 (Approximate)   BMI 20.49 kg/m   \ Wt Readings from Last 3 Encounters:  03/28/16 112 lb (50.8 kg)  03/18/16 113 lb 9.6 oz (51.5 kg)  03/11/16 112 lb 6.4 oz (51 kg)

## 2016-03-28 NOTE — Progress Notes (Signed)
Department of Radiation Oncology  Phone:  [phone] Fax:        [phone]  Weekly Treatment Note    Name: Paula Dawson Date: 03/28/2016 MRN: [id number] DOB: November 01, 1945   Diagnosis:     ICD-9-CM ICD-10-CM   1. Malignant neoplasm of upper-outer quadrant of left female breast, unspecified estrogen receptor status ([place]) 174.4 C50.412      Current dose: 32.5 Gy  Current fraction: 13   MEDICATIONS: Current Outpatient Prescriptions  Medication Sig Dispense Refill  . Ascorbic Acid (VITAMIN C) 500 MG CAPS Take 1 capsule by mouth daily.    . Ascorbic Acid Buffered (BUFFERED VITAMIN C PO) Take 750 mg by mouth daily.     Marland Kitchen aspirin 81 MG tablet Take 81 mg by mouth daily.    . B Complex-Folic Acid (B COMPLEX-VITAMIN B12 PO) Take 750 mg by mouth daily.    . Calcium Citrate (CAL-CITRATE) 150 MG CAPS Take 150 mg by mouth daily.    . carboxymethylcellulose (REFRESH PLUS) 0.5 % SOLN Place 1 drop into both eyes 3 (three) times daily as needed.    . Cholecalciferol (VITAMIN D3) 50000 units CAPS Take 50,000 Units by mouth once a week.    . denosumab (PROLIA) 60 MG/ML SOLN injection Inject 60 mg into the skin every 6 (six) months. Administer in upper arm, thigh, or abdomen    . diphenhydrAMINE (BENADRYL) 25 mg capsule Take 25 mg by mouth daily as needed for allergies.    . Estradiol 10 MCG TABS vaginal tablet Place 1 tablet vaginally 2 (two) times a week.    . Ferrous Sulfate (IRON) 28 MG TABS Take 28 mg by mouth daily.    . Flaxseed, Linseed, (FLAX SEED OIL) 1000 MG CAPS Take 1 tablet by mouth daily.    . hyaluronate sodium (RADIAPLEXRX) GEL Apply 1 application topically 2 (two) times daily.    . Multiple Vitamin (MULTIVITAMIN) capsule Take 1 capsule by mouth daily.    . Multiple Vitamins-Minerals (MULTIVITAMIN WOMEN 50+) TABS Take 1 tablet by mouth daily.    . non-metallic deodorant Jethro Poling) MISC Apply 1 application topically daily as needed.    . NUTRITIONAL SUPPLEMENTS PO Take 1  each by mouth daily. aerads 2 vitamins    . oxyCODONE (OXY IR/ROXICODONE) 5 MG immediate release tablet Take 1-2 tablets (5-10 mg total) by mouth every 6 (six) hours as needed for moderate pain, severe pain or breakthrough pain. 15 tablet 0  . ranitidine (ZANTAC) 150 MG capsule Take 150 mg by mouth 2 (two) times daily.    . RESTASIS 0.05 % ophthalmic emulsion Place 1 drop into both eyes 2 (two) times daily.     Marland Kitchen SYNTHROID 88 MCG tablet Take 1 tablet by mouth daily.    . valACYclovir (VALTREX) 1000 MG tablet Take 500 mg by mouth daily.    . Vitamin D, Ergocalciferol, (DRISDOL) 50000 units CAPS capsule Take 1 capsule by mouth every 14 (fourteen) days.     No current facility-administered medications for this encounter.      ALLERGIES: Vicodin [hydrocodone-acetaminophen]   LABORATORY DATA:  Lab Results  Component Value Date   WBC 10.9 (H) 02/09/2016   HGB 14.9 02/09/2016   HCT 44.7 02/09/2016   MCV 96.1 02/09/2016   PLT 255 02/09/2016   Lab Results  Component Value Date   NA 141 02/09/2016   K 3.7 02/09/2016   CL 108 02/09/2016   CO2 25 02/09/2016   Lab Results  Component Value Date  ALT 21 01/27/2016   AST 21 01/27/2016   ALKPHOS 93 01/27/2016   BILITOT 0.36 01/27/2016     NARRATIVE: Paula Dawson was seen today for weekly treatment management. The chart was checked and the patient's films were reviewed.  Weekly rad txs left breast 13 completed. Rash is noted on the treatment area. Patient reports using hydrocortisone cream and radiaplex bid. Patient denies itching, or fatigue. She notes a good appetite.  PHYSICAL EXAMINATION: weight is 112 lb (50.8 kg). Her oral temperature is 97.8 F (36.6 C). Her blood pressure is 129/80 and her pulse is 63. Her respiration is 16.   Mild hyperpigmentation in the treatment area with some dermatitis in the upper inner aspect of the left breast.   ASSESSMENT: The patient is doing satisfactorily with treatment.  PLAN: We will  continue with the patient's radiation treatment as planned.  ------------------------------------------------  Jodelle Gross, MD, PhD  This document serves as a record of services personally performed by Kyung Rudd, MD. It was created on his behalf by Bethann Humble, a trained medical scribe. The creation of this record is based on the scribe's personal observations and the provider's statements to them. This document has been checked and approved by the attending provider.

## 2016-03-29 ENCOUNTER — Ambulatory Visit
Admission: RE | Admit: 2016-03-29 | Discharge: 2016-03-29 | Disposition: A | Discharge status: Home / Self Care | Payer: Medicare Other | Source: Ambulatory Visit | Attending: Radiation Oncology | Admitting: Radiation Oncology

## 2016-03-29 DIAGNOSIS — Z510 Encounter for antineoplastic radiation therapy: Secondary | ICD-10-CM | POA: Diagnosis not present

## 2016-03-30 ENCOUNTER — Ambulatory Visit
Admission: RE | Admit: 2016-03-30 | Discharge: 2016-03-30 | Disposition: A | Discharge status: Home / Self Care | Payer: Medicare Other | Source: Ambulatory Visit | Attending: Radiation Oncology | Admitting: Radiation Oncology

## 2016-03-30 DIAGNOSIS — Z510 Encounter for antineoplastic radiation therapy: Secondary | ICD-10-CM | POA: Diagnosis not present

## 2016-03-31 ENCOUNTER — Ambulatory Visit
Admission: RE | Admit: 2016-03-31 | Discharge: 2016-03-31 | Disposition: A | Discharge status: Home / Self Care | Payer: Medicare Other | Source: Ambulatory Visit | Attending: Radiation Oncology | Admitting: Radiation Oncology

## 2016-03-31 DIAGNOSIS — Z510 Encounter for antineoplastic radiation therapy: Secondary | ICD-10-CM | POA: Diagnosis not present

## 2016-04-01 ENCOUNTER — Ambulatory Visit
Admission: RE | Admit: 2016-04-01 | Discharge: 2016-04-01 | Disposition: A | Discharge status: Home / Self Care | Payer: Medicare Other | Source: Ambulatory Visit | Attending: Radiation Oncology | Admitting: Radiation Oncology

## 2016-04-01 VITALS — BP 124/70 | HR 63 | Temp 98.0°F | Resp 12 | Wt 112.8 lb

## 2016-04-01 DIAGNOSIS — Z510 Encounter for antineoplastic radiation therapy: Secondary | ICD-10-CM | POA: Diagnosis not present

## 2016-04-01 DIAGNOSIS — Z171 Estrogen receptor negative status [ER-]: Principal | ICD-10-CM

## 2016-04-01 DIAGNOSIS — C50412 Malignant neoplasm of upper-outer quadrant of left female breast: Secondary | ICD-10-CM

## 2016-04-01 NOTE — Progress Notes (Signed)
PAIN: She is currently in no pain. By evening her pain builds to a 5 over the left breast area, described as a dull pain. SKIN: Pt left breast- positive for Rash and erythema, no complaints of itching.  Pt denies edema.  Pt continues to apply Radiaplex and Hydrocortisone as directed. OTHER: BP 124/70   Pulse 63   Temp 98 F (36.7 C) (Oral)   Resp 12   Wt 112 lb 12.8 oz (51.2 kg)   LMP 05/16/2000 (Approximate)   SpO2 98%   BMI 20.63 kg/m  Wt Readings from Last 3 Encounters:  04/01/16 112 lb 12.8 oz (51.2 kg)  03/28/16 112 lb (50.8 kg)  03/18/16 113 lb 9.6 oz (51.5 kg)

## 2016-04-03 NOTE — Progress Notes (Signed)
Department of Radiation Oncology  Phone:  [phone] Fax:        [phone]  Weekly Treatment Note    Name: Paula Dawson Date: 04/03/2016 MRN: [id number] DOB: 12/31/1945   Diagnosis:     ICD-9-CM ICD-10-CM   1. Malignant neoplasm of upper-outer quadrant of left breast in female, estrogen receptor negative ([place]) 174.4 C50.412    V86.1 Z17.1      Current dose: 42.5 Gy  Current fraction: 17   MEDICATIONS: Current Outpatient Prescriptions  Medication Sig Dispense Refill  . Ascorbic Acid (VITAMIN C) 500 MG CAPS Take 1 capsule by mouth daily.    . Ascorbic Acid Buffered (BUFFERED VITAMIN C PO) Take 750 mg by mouth daily.     Marland Kitchen aspirin 81 MG tablet Take 81 mg by mouth daily.    . B Complex-Folic Acid (B COMPLEX-VITAMIN B12 PO) Take 750 mg by mouth daily.    . Calcium Citrate (CAL-CITRATE) 150 MG CAPS Take 150 mg by mouth daily.    . carboxymethylcellulose (REFRESH PLUS) 0.5 % SOLN Place 1 drop into both eyes 3 (three) times daily as needed.    . Cholecalciferol (VITAMIN D3) 50000 units CAPS Take 50,000 Units by mouth once a week.    . denosumab (PROLIA) 60 MG/ML SOLN injection Inject 60 mg into the skin every 6 (six) months. Administer in upper arm, thigh, or abdomen    . diphenhydrAMINE (BENADRYL) 25 mg capsule Take 25 mg by mouth daily as needed for allergies.    . Estradiol 10 MCG TABS vaginal tablet Place 1 tablet vaginally 2 (two) times a week.    . Ferrous Sulfate (IRON) 28 MG TABS Take 28 mg by mouth daily.    . Flaxseed, Linseed, (FLAX SEED OIL) 1000 MG CAPS Take 1 tablet by mouth daily.    . hyaluronate sodium (RADIAPLEXRX) GEL Apply 1 application topically 2 (two) times daily.    . Multiple Vitamin (MULTIVITAMIN) capsule Take 1 capsule by mouth daily.    . Multiple Vitamins-Minerals (MULTIVITAMIN WOMEN 50+) TABS Take 1 tablet by mouth daily.    . non-metallic deodorant Jethro Poling) MISC Apply 1 application topically daily as needed.    . NUTRITIONAL SUPPLEMENTS  PO Take 1 each by mouth daily. aerads 2 vitamins    . oxyCODONE (OXY IR/ROXICODONE) 5 MG immediate release tablet Take 1-2 tablets (5-10 mg total) by mouth every 6 (six) hours as needed for moderate pain, severe pain or breakthrough pain. 15 tablet 0  . ranitidine (ZANTAC) 150 MG capsule Take 150 mg by mouth 2 (two) times daily.    . RESTASIS 0.05 % ophthalmic emulsion Place 1 drop into both eyes 2 (two) times daily.     Marland Kitchen SYNTHROID 88 MCG tablet Take 1 tablet by mouth daily.    . valACYclovir (VALTREX) 1000 MG tablet Take 500 mg by mouth daily.    . Vitamin D, Ergocalciferol, (DRISDOL) 50000 units CAPS capsule Take 1 capsule by mouth every 14 (fourteen) days.     No current facility-administered medications for this encounter.      ALLERGIES: Vicodin [hydrocodone-acetaminophen]   LABORATORY DATA:  Lab Results  Component Value Date   WBC 10.9 (H) 02/09/2016   HGB 14.9 02/09/2016   HCT 44.7 02/09/2016   MCV 96.1 02/09/2016   PLT 255 02/09/2016   Lab Results  Component Value Date   NA 141 02/09/2016   K 3.7 02/09/2016   CL 108 02/09/2016   CO2 25 02/09/2016   Lab Results  Component Value Date   ALT 21 01/27/2016   AST 21 01/27/2016   ALKPHOS 93 01/27/2016   BILITOT 0.36 01/27/2016     NARRATIVE: [name] was seen today for weekly treatment management. The chart was checked and the patient's films were reviewed.  PAIN: She is currently in no pain. By evening her pain builds to a 5 over the left breast area, described as a dull pain. SKIN: Pt left breast- positive for Rash and erythema, no complaints of itching.  Pt denies edema.  Pt continues to apply Radiaplex and Hydrocortisone as directed. OTHER: BP 124/70   Pulse 63   Temp 98 F (36.7 C) (Oral)   Resp 12   Wt 112 lb 12.8 oz (51.2 kg)   LMP 05/16/2000 (Approximate)   SpO2 98%   BMI 20.63 kg/m  Wt Readings from Last 3 Encounters:  04/01/16 112 lb 12.8 oz (51.2 kg)  03/28/16 112 lb (50.8 kg)    03/18/16 113 lb 9.6 oz (51.5 kg)    PHYSICAL EXAMINATION: weight is 112 lb 12.8 oz (51.2 kg). Her oral temperature is 98 F (36.7 C). Her blood pressure is 124/70 and her pulse is 63. Her respiration is 12 and oxygen saturation is 98%.      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 Otherwise the skin is doing excellent.  ASSESSMENT: The patient is doing satisfactorily with treatment.  PLAN: We will continue with the patient's radiation treatment as planned.

## 2016-04-04 ENCOUNTER — Ambulatory Visit
Admission: RE | Admit: 2016-04-04 | Discharge: 2016-04-04 | Disposition: A | Discharge status: Home / Self Care | Payer: Medicare Other | Source: Ambulatory Visit | Attending: Radiation Oncology | Admitting: Radiation Oncology

## 2016-04-04 DIAGNOSIS — Z510 Encounter for antineoplastic radiation therapy: Secondary | ICD-10-CM | POA: Diagnosis not present

## 2016-04-05 ENCOUNTER — Ambulatory Visit
Admission: RE | Admit: 2016-04-05 | Discharge: 2016-04-05 | Disposition: A | Discharge status: Home / Self Care | Payer: Medicare Other | Source: Ambulatory Visit | Attending: Radiation Oncology | Admitting: Radiation Oncology

## 2016-04-05 DIAGNOSIS — Z510 Encounter for antineoplastic radiation therapy: Secondary | ICD-10-CM | POA: Diagnosis not present

## 2016-04-06 ENCOUNTER — Encounter: Payer: Self-pay | Admitting: Radiation Oncology

## 2016-04-06 ENCOUNTER — Ambulatory Visit
Admission: RE | Admit: 2016-04-06 | Discharge: 2016-04-06 | Disposition: A | Discharge status: Home / Self Care | Payer: Medicare Other | Source: Ambulatory Visit | Attending: Radiation Oncology | Admitting: Radiation Oncology

## 2016-04-06 DIAGNOSIS — Z510 Encounter for antineoplastic radiation therapy: Secondary | ICD-10-CM | POA: Diagnosis not present

## 2016-04-08 ENCOUNTER — Telehealth: Payer: Self-pay | Admitting: *Deleted

## 2016-04-08 NOTE — Telephone Encounter (Signed)
  Oncology Nurse Navigator Documentation  Navigator Location: [facility] (04/08/16 1100)   )Navigator Encounter Type: Telephone (04/08/16 1100) Telephone: Outgoing Call (04/08/16 1100)       Genetic Counseling Date: 03/02/16 (04/08/16 1100) Genetic Counseling Type: Non-Urgent (04/08/16 1100)  Multidisiplinary Clinic Date: 01/27/16 (04/08/16 1100) Multidisiplinary Clinic Type: Breast (04/08/16 1100)   Patient Visit Type: [id number] (04/08/16 1100) Treatment Phase: Final Radiation Tx (04/08/16 1100) Barriers/Navigation Needs: No barriers at this time (04/08/16 1100)   Interventions: None required (04/08/16 1100)            Acuity: Level 1 (04/08/16 1100)         Time Spent with Patient: 15 (04/08/16 1100)

## 2016-04-23 NOTE — Progress Notes (Signed)
Complex simulation note  Diagnosis: left-sided breast cancer  Narrative The patient has initially been planned to receive a course of whole breast radiation to a dose of 42.5 Gy in 17 fractions. The patient will now receive an additional boost to the seroma cavity which has been contoured. This will correspond to a boost of 7.5 Gy at 2.5 Gy per fraction. To accomplish this, an additional 2 customized blocks have been designed for this purpose. A complex isodose plan is requested to ensure that the target area is adequately covered with radiation dose and that the nearby normal structures such as the lung are adequately spared. The patient's final total dose will be 50 Gy.  ------------------------------------------------  Luciana Cammarata S. Wing Schoch, MD, PhD 

## 2016-04-23 NOTE — Progress Notes (Signed)
  Radiation Oncology         [phone] ________________________________  Name: Paula Dawson MRN: [id number]  Date: 04/06/2016  DOB: 04/18/1946  End of Treatment Note  Diagnosis:   left-sided breast cancer     Indication for treatment:  Curative       Radiation treatment dates:   03/10/2016 through 04/06/2016  Site/dose:   The patient initially received a dose of 42.5 Gy in 17 fractions to the breast using whole-breast tangent fields. This was delivered using a 3-D conformal technique. The patient then received a boost to the seroma. This delivered an additional 7.5 Gy in 3 fractions using a 3 field photon technique due to the depth of the seroma. The total dose was 50 Gy.  Narrative: The patient tolerated radiation treatment relatively well.   The patient had some expected skin irritation as she progressed during treatment. Moist desquamation was not present at the end of treatment.  Plan: The patient has completed radiation treatment. The patient will return to radiation oncology clinic for routine followup in one month. I advised the patient to call or return sooner if they have any questions or concerns related to their recovery or treatment. ________________________________  Jodelle Gross, M.D., Ph.D.

## 2016-04-25 ENCOUNTER — Ambulatory Visit: Payer: Medicare Other | Admitting: Internal Medicine

## 2016-04-25 ENCOUNTER — Ambulatory Visit (HOSPITAL_BASED_OUTPATIENT_CLINIC_OR_DEPARTMENT_OTHER): Payer: Medicare Other | Admitting: Oncology

## 2016-04-25 VITALS — BP 138/77 | HR 68 | Temp 97.6°F | Resp 18 | Ht 62.0 in | Wt 114.6 lb

## 2016-04-25 DIAGNOSIS — D0512 Intraductal carcinoma in situ of left breast: Secondary | ICD-10-CM | POA: Diagnosis not present

## 2016-04-25 DIAGNOSIS — Z171 Estrogen receptor negative status [ER-]: Secondary | ICD-10-CM

## 2016-04-25 DIAGNOSIS — C50412 Malignant neoplasm of upper-outer quadrant of left female breast: Secondary | ICD-10-CM

## 2016-04-25 NOTE — Addendum Note (Signed)
Addended by: Jaci Carrel A on: 04/25/2016 02:55 PM   Modules accepted: Orders

## 2016-04-25 NOTE — Progress Notes (Signed)
[place]  [phone] [phone]     ID: Paula Dawson DOB: 10/28/1945  MR#: [id number]  TIW#:[id number]  Patient Care Team: Harlan Stains, MD as PCP - General (Family Medicine) [name], MD as Consulting Physician (General Surgery) Chauncey Cruel, MD as Consulting Physician (Oncology) Kyung Rudd, MD as Consulting Physician (Radiation Oncology) Kem Boroughs, FNP as Nurse Practitioner (Family Medicine) OTHER MD:  CHIEF COMPLAINT: Ductal carcinoma in situ  CURRENT TREATMENT: Tamoxifen   BREAST CANCER HISTORY: From the original intake note:  Paula Dawson had bilateral screening mammography at the [facility] 01/06/2016, showing a possible mass in the left breast. On 01/13/2016 she had left diagnostic mammography with tomography and left breast ultrasonography. The breast density was category C. In the upper outer quadrant of the left breast there was an area of asymmetry measuring 0.8 cm. On physical exam there was no palpable mass. Ultrasonography confirmed an irregular hypoechoic mass at the 2:00 position 6 cm from the nipple measuring 1.4 cm. Ultrasound of the left axilla was benign.  On 01/15/2016 the patient underwent biopsy of the left breast mass in question, and this showed ([id number]) ductal carcinoma in situ, grade 3, estrogen receptor negative, progesterone receptor questionably positive (20% with weak staining intensity).  Her subsequent history is as detailed below  INTERVAL HISTORY: Paula Dawson returns today for follow-up of her ductal carcinoma in situ. Since her last visit here she underwent left lumpectomy and sentinel lymph node sampling, on 02/11/2016. The final pathology ([id number]) showed ductal carcinoma in situ, high-grade, measuring 1.1 cm, but with negative margins. Both sentinel lymph nodes were clear. The prior prognostic profile as noted showed the tumor to be estrogen receptor negative, progesterone receptor weakly  positive at 20%.  She then proceeded to adjuvant radiation which she completed 04/06/2016.  She also had genetics testing which found no deleterious mutation. She is here today to discuss anti-estrogens  REVIEW OF SYSTEMS: She tolerated the surgery and radiation without significant side effects. She did have some tenderness after all these procedures she has a good range of motion in the left upper extremity she continues to have problems related to vaginal atrophy and she has taken herself off estrogen replacement which she was receiving because of persistent bladder problems. Aside from these issues a detailed review of systems today was stable  PAST MEDICAL HISTORY: Past Medical History:  Diagnosis Date  . Anemia   . Arthritis   . Cancer [facility])    loeft breast cancer  . Dry eye syndrome   . Early cataract   . Family history of breast cancer   . Family history of ovarian cancer   . Family history of prostate cancer   . Gall stones   . GERD (gastroesophageal reflux disease)   . History of hiatal hernia   . HSV infection    pos I and II  . Kidney stones   . Macular degeneration of both eyes   . Osteopenia   . Osteoporosis 1998   Off Fosomax 2008, Prolia 03/17/11, 09/14/11, 03/16/12  . Thyroid disease age 57   hypo  . Wears glasses     PAST SURGICAL HISTORY: Past Surgical History:  Procedure Laterality Date  . AUGMENTATION MAMMAPLASTY  1981  . BREAST IMPLANT REMOVAL  1982  . BREAST LUMPECTOMY WITH RADIOACTIVE SEED AND SENTINEL LYMPH NODE BIOPSY Left 02/11/2016   Procedure: LEFT BREAST LUMPECTOMY WITH RADIOACTIVE SEED AND LEFT SENTINEL LYMPH NODE BIOPSY;  Surgeon: [name], MD;  Location: [facility] OR;  Service: General;  Laterality: Left;  . COLONOSCOPY  10/04   Dr Watt Climes  . COLONOSCOPY  04/07/08   normal - Dr. Watt Climes  . ELBOW SURGERY Left 12/1997  . ESOPHAGOGASTRODUODENOSCOPY ENDOSCOPY  08/2012   [place] hernia with GERD  . FOOT NEUROMA SURGERY Left 2004?  . TONSILLECTOMY AND  ADENOIDECTOMY    . TUBAL LIGATION  1981    FAMILY HISTORY Family History  Problem Relation Age of Onset  . Heart failure Mother   . Prostate cancer Father 37  . Liver cancer Father   . Colon polyps Sister   . Breast cancer Sister 31  . Breast cancer Sister 65  . Ovarian cancer Cousin     died at 42  . Breast cancer Sister 65    recurrance at 40  . Breast cancer Sister 78  . Colon polyps Sister   . Breast cancer Other     X 4 + age 30, 54 with recurrence, 90, 66  . Lung cancer Brother     smoked  . Breast cancer Paternal Aunt   . Prostate cancer Brother 42  . Melanoma Brother   . Colon cancer Other 65  The patient's father died at age 66. He had been diagnosed with prostate "and liver" cancer at the age of 22. The patient's mother died at age 15. The patient had 4 brothers, 5 sisters. One brother was diagnosed with lung cancer at age 40. She has 4 sisters with breast cancer, one initially diagnosed at age 34, recurring at age 36.  GYNECOLOGIC HISTORY:  Patient's last menstrual period was 05/16/2000 (approximate). Menarche age 62, first live birth age 20, the patient is GX P1. She stopped having periods approximately 2002. She used hormone replacement for approximately 5 years. 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 She is divorced and lives alone, with 1. Her son Paula Dawson lives in [place]. His wife is a Cabin crew and he is her handyman. The patient has 2 grandsons. She attends a [employer] in the [place] area    ADVANCED DIRECTIVES: In place; the patient has named her son Paula Dawson as her healthcare part of attorney. He can be reached at [phone].   HEALTH MAINTENANCE: Social History  Substance Use Topics  . Smoking status: Never Smoker  . Smokeless tobacco: Never Used  . Alcohol use No     Colonoscopy: 2014/May  got  PAP:  Bone density: December 2016, T score -2.9   Allergies  Allergen Reactions  . Vicodin [Hydrocodone-Acetaminophen] Other (See Comments)    Pt prefers not to take because it knocks her out    Current Outpatient Prescriptions  Medication Sig Dispense Refill  . Ascorbic Acid (VITAMIN C) 500 MG CAPS Take 1 capsule by mouth daily.    . Ascorbic Acid Buffered (BUFFERED VITAMIN C PO) Take 750 mg by mouth daily.     Marland Kitchen aspirin 81 MG tablet Take 81 mg by mouth daily.    . B Complex-Folic Acid (B COMPLEX-VITAMIN B12 PO) Take 750 mg by mouth daily.    . Calcium Citrate (CAL-CITRATE) 150 MG CAPS Take 150 mg by mouth daily.    . carboxymethylcellulose (REFRESH PLUS) 0.5 % SOLN Place 1 drop into both eyes 3 (three) times daily as needed.    . Cholecalciferol (VITAMIN D3) 50000 units CAPS Take 50,000 Units  by mouth once a week.    . denosumab (PROLIA) 60 MG/ML SOLN injection Inject 60 mg into the skin every 6 (six) months. Administer in upper arm, thigh, or abdomen    . diphenhydrAMINE (BENADRYL) 25 mg capsule Take 25 mg by mouth daily as needed for allergies.    . Estradiol 10 MCG TABS vaginal tablet Place 1 tablet vaginally 2 (two) times a week.    . Ferrous Sulfate (IRON) 28 MG TABS Take 28 mg by mouth daily.    . Flaxseed, Linseed, (FLAX SEED OIL) 1000 MG CAPS Take 1 tablet by mouth daily.    . hyaluronate sodium (RADIAPLEXRX) GEL Apply 1 application topically 2 (two) times daily.    . Multiple Vitamin (MULTIVITAMIN) capsule Take 1 capsule by mouth daily.    . Multiple Vitamins-Minerals (MULTIVITAMIN WOMEN 50+) TABS Take 1 tablet by mouth daily.    . non-metallic deodorant Jethro Poling) MISC Apply 1 application topically daily as needed.    . NUTRITIONAL SUPPLEMENTS PO Take 1 each by mouth daily. aerads 2 vitamins    . oxyCODONE (OXY IR/ROXICODONE) 5 MG immediate release tablet Take 1-2 tablets (5-10 mg total) by mouth every 6 (six) hours as needed for moderate pain, severe pain or breakthrough  pain. 15 tablet 0  . ranitidine (ZANTAC) 150 MG capsule Take 150 mg by mouth 2 (two) times daily.    . RESTASIS 0.05 % ophthalmic emulsion Place 1 drop into both eyes 2 (two) times daily.     Marland Kitchen SYNTHROID 88 MCG tablet Take 1 tablet by mouth daily.    . valACYclovir (VALTREX) 1000 MG tablet Take 500 mg by mouth daily.    . Vitamin D, Ergocalciferol, (DRISDOL) 50000 units CAPS capsule Take 1 capsule by mouth every 14 (fourteen) days.     No current facility-administered medications for this visit.     OBJECTIVE: Middle-aged white woman In no acute distress  Vitals:   04/25/16 1339  BP: 138/77  Pulse: 68  Resp: 18  Temp: 97.6 F (36.4 C)     Body mass index is 20.96 kg/m.    ECOG FS:1 - Symptomatic but completely ambulatory  Sclerae unicteric, pupils round and equal Oropharynx clear and moist-- no thrush or other lesions No cervical or supraclavicular adenopathy Lungs no rales or rhonchi Heart regular rate and rhythm Abd soft, nontender, positive bowel sounds MSK no focal spinal tenderness, no upper extremity lymphedema Neuro: nonfocal, well oriented, appropriate affect Breasts: The right breast is unremarkable. The left breast is status post lumpectomy and radiation. There is no evidence of local recurrence. The left breast is slightly larger than the right, indicating some persistent swelling. There is no erythema. The left axilla is benign.    LAB RESULTS:  CMP     Component Value Date/Time   NA 141 02/09/2016 1328   NA 142 01/27/2016 1223   K 3.7 02/09/2016 1328   K 3.6 01/27/2016 1223   CL 108 02/09/2016 1328   CO2 25 02/09/2016 1328   CO2 27 01/27/2016 1223   GLUCOSE 110 (H) 02/09/2016 1328   GLUCOSE 131 01/27/2016 1223   BUN 14 02/09/2016 1328   BUN 14.8 01/27/2016 1223   CREATININE 0.73 02/09/2016 1328   CREATININE 0.8 01/27/2016 1223   CALCIUM 9.1 02/09/2016 1328   CALCIUM 9.1 01/27/2016 1223   PROT 6.6 01/27/2016 1223   ALBUMIN 3.6 01/27/2016 1223   AST  21 01/27/2016 1223   ALT 21 01/27/2016 1223   ALKPHOS 93 01/27/2016  1223   BILITOT 0.36 01/27/2016 1223   GFRNONAA >60 02/09/2016 1328   GFRAA >60 02/09/2016 1328    INo results found for: SPEP, UPEP  Lab Results  Component Value Date   WBC 10.9 (H) 02/09/2016   NEUTROABS 7.0 (H) 01/27/2016   HGB 14.9 02/09/2016   HCT 44.7 02/09/2016   MCV 96.1 02/09/2016   PLT 255 02/09/2016      Chemistry      Component Value Date/Time   NA 141 02/09/2016 1328   NA 142 01/27/2016 1223   K 3.7 02/09/2016 1328   K 3.6 01/27/2016 1223   CL 108 02/09/2016 1328   CO2 25 02/09/2016 1328   CO2 27 01/27/2016 1223   BUN 14 02/09/2016 1328   BUN 14.8 01/27/2016 1223   CREATININE 0.73 02/09/2016 1328   CREATININE 0.8 01/27/2016 1223      Component Value Date/Time   CALCIUM 9.1 02/09/2016 1328   CALCIUM 9.1 01/27/2016 1223   ALKPHOS 93 01/27/2016 1223   AST 21 01/27/2016 1223   ALT 21 01/27/2016 1223   BILITOT 0.36 01/27/2016 1223       No results found for: LABCA2  No components found for: LABCA125  No results for input(s): INR in the last 168 hours.  Urinalysis    Component Value Date/Time   BILIRUBINUR - 05/29/2014 1012   PROTEINUR - 05/29/2014 1012   UROBILINOGEN negative 05/29/2014 1012   NITRITE - 05/29/2014 1012   LEUKOCYTESUR Negative 05/29/2014 1012     STUDIES: No results found.  ELIGIBLE FOR AVAILABLE RESEARCH PROTOCOL: no  ASSESSMENT: 70 y.o. [place] woman status post left breast upper outer quadrant biopsy 01/15/2016 for ductal carcinoma in situ, grade 3, estrogen receptor negative, progesterone receptor questionably positive  (1) Status post left lumpectomy and sentinel lymph node sampling 02/11/2016 for a pTis pN0, stage 0 ductal carcinoma in situ, grade 3, with negative margins.  (2) adjuvant radiation 03/10/2016 through 04/06/2016 Site/dose:   The patient initially received a dose of 42.5 Gy in 17 fractions to the breast using whole-breast tangent  fields. This was delivered using a 3-D conformal technique. The patient then received a boost to the seroma. This delivered an additional 7.5 Gy in 3 fractions using a 3 field photon technique due to the depth of the seroma. The total dose was 50 Gy.  (3) consider genetics 03/21/2016 through the Breast/Ovarian cancer gene panel.offered by [facility] found no deleterious mutations in  ATM, BARD1, BRCA1, BRCA2, BRIP1, CDH1, CHEK2, EPCAM, FANCC, MLH1, MSH2, MSH6, NBN, PALB2, PMS2, PTEN, RAD51C, RAD51D, TP53, and XRCC2  PLAN: I spent a little over 30 minutes with Paula Dawson today going over her situation. We reviewed the fact that her cancer was noninvasive and therefore not life-threatening. It was removed and then she had radiation which greatly reduces the risk of local recurrence furthermore the cancer was estrogen receptor negative. It was weakly progesterone receptor positive. Of course estrogen and progesterone are interchangeable in the body so if she took an antiestrogen she would still get a little bit of benefit in terms of this particular cancer not coming back. However as far as I can calculate that benefit is so marginal that I'm very comfortable with her not receiving anti-estrogens for treatment of this cancer.  However she can consider anti-estrogens for breast cancer prevention. In particular, tamoxifen blocks the estrogen receptor in breast cells. It reduces the risk of a new breast cancer developing by one half. That risk in her, since she  does not carry a high-risk mutation, amounts of approximately 1% per year. If she lives another 30 years, then the risk would drop from 30% without tamoxifen to 15% with tamoxifen.  We discussed the possible toxicities, side effects and complications of tamoxifen in detail. She is interested in using vaginal estrogens for management of vaginal atrophy and urinary tract infections. However the recent [place] study published in the [place] of medicine  unfortunately documents increased risk with any estrogen preparation including modern oral contraceptives and transvaginal use of estrogen. She could use estrogens anyway if she accepts the fact that there would be a higher risk of breast cancer recurrence  An alternative would be for her to take tamoxifen. We do have other data that shows that in patients use vaginal estrogens while on tamoxifen there is no documented increased risk of breast cancer. There may of course be an increased risk of endometrial cancer as there would be with unopposed estrogen preparations or with tamoxifen itself.  This is a lot to digest but her feeling today is that she would like to get tamoxifen a try. If she tolerates it well then she gets a decreased risk of a new breast cancer developing, some help to her bones, and the ability to use transvaginal estrogen preparations. She will let us know in the next 2 or 3 months whether she has any side effects that we might want to intervene 4  Otherwise she will see me again next September, after her August mammogram. The plan will be to see her on a yearly basis until she completes 5 years of follow-up      Chauncey Cruel, MD   04/25/2016 1:50 PM Medical Oncology and Hematology [facility], [place] [address] Tel. [phone]    Fax. [phone]

## 2016-04-27 ENCOUNTER — Encounter: Payer: Self-pay | Admitting: Oncology

## 2016-04-29 ENCOUNTER — Other Ambulatory Visit: Payer: Self-pay | Admitting: *Deleted

## 2016-04-29 MED ORDER — TAMOXIFEN CITRATE 20 MG PO TABS: 20.0000 mg | ORAL_TABLET | Freq: Every day | ORAL | 3 refills | Status: DC

## 2016-05-12 NOTE — Progress Notes (Addendum)
Paula Dawson 70 year old woman is here for a one month follow up appointment for Malignant neoplasm of upper-outer quadrant of left breast in female, estrogen receptor negative   Skin status: Left breast with tanning under the inframmary fold otherwise normal color. What lotion are you using? Using Radiaplex gel to left breast. Have you seen med onc? If not, when is appointment: 04-25-16 Dr. Jana Hakim next appointment 02-06-17 If they are ER+, have they started Al or Tamoxifen? If not, why? 05-06-16 Tamoxifen  Discuss survivorship appointment. Not scheduled yet Have you had a mammogram scheduled? Not scheduled yet Offer referral to [facility]. Will receive at the survivorship appointment. Appetite: Good Pain:None,having soreness under the left armpit and she feels like she has fluid still from her surgery.  Will see surgeon Dr. Barry Dienes on 06-06-16 Fatigue:None, walking 3-4 times a week Arm mobility:Having some problems raising the left arm feels the tightness and soreness at that time, no swelling down her left arm. Wt Readings from Last 3 Encounters:  05/19/16 116 lb 12.8 oz (53 kg)  04/25/16 114 lb 9.6 oz (52 kg)  04/01/16 112 lb 12.8 oz (51.2 kg)  BP (!) 145/77   Pulse 72   Temp 98.2 F (36.8 C) (Oral)   Resp 18   Ht 5\' 2"  (1.575 m)   Wt 116 lb 12.8 oz (53 kg)   LMP 05/16/2000 (Approximate)   SpO2 100%   BMI 21.36 kg/m

## 2016-05-17 ENCOUNTER — Ambulatory Visit: Payer: Self-pay | Admitting: Radiation Oncology

## 2016-05-19 ENCOUNTER — Ambulatory Visit
Admission: RE | Admit: 2016-05-19 | Discharge: 2016-05-19 | Disposition: A | Discharge status: Home / Self Care | Payer: Medicare Other | Source: Ambulatory Visit | Attending: Radiation Oncology | Admitting: Radiation Oncology

## 2016-05-19 ENCOUNTER — Encounter: Payer: Self-pay | Admitting: Radiation Oncology

## 2016-05-19 VITALS — BP 145/77 | HR 72 | Temp 98.2°F | Resp 18 | Ht 62.0 in | Wt 116.8 lb

## 2016-05-19 DIAGNOSIS — Z170 Estrogen receptor positive status [ER+]: Secondary | ICD-10-CM | POA: Diagnosis not present

## 2016-05-19 DIAGNOSIS — Z79899 Other long term (current) drug therapy: Secondary | ICD-10-CM | POA: Diagnosis not present

## 2016-05-19 DIAGNOSIS — K5900 Constipation, unspecified: Secondary | ICD-10-CM | POA: Insufficient documentation

## 2016-05-19 DIAGNOSIS — D0512 Intraductal carcinoma in situ of left breast: Secondary | ICD-10-CM | POA: Insufficient documentation

## 2016-05-19 DIAGNOSIS — Z7982 Long term (current) use of aspirin: Secondary | ICD-10-CM | POA: Diagnosis not present

## 2016-05-19 DIAGNOSIS — Z888 Allergy status to other drugs, medicaments and biological substances status: Secondary | ICD-10-CM | POA: Diagnosis not present

## 2016-05-19 DIAGNOSIS — C50412 Malignant neoplasm of upper-outer quadrant of left female breast: Secondary | ICD-10-CM

## 2016-05-19 NOTE — Progress Notes (Signed)
Radiation Oncology         [phone] ________________________________  Name: Paula Dawson MRN: [id number]  Date: 05/19/2016  DOB: 1945/11/23  Post Treatment Note  CC: Vidal Schwalbe, MD  Harlan Stains, MD  Diagnosis:  ER positive DCIS of the left breast.  Interval Since Last Radiation:  6 weeks   03/10/2016 through 04/06/2016: The patient initially received a dose of 42.5 Gy in 17 fractions to the breast using whole-breast tangent fields. This was delivered using a 3-D conformal technique. The patient then received a boost to the seroma. This delivered an additional 7.5 Gy in 3 fractions using a 3 field photon technique due to the depth of the seroma. The total dose was 50 Gy.  Narrative:  The patient returns today for routine follow-up. During treatment she did very well with radiotherapy and did not have significant desquamation.                          On review of systems, the patient states she's doing well overall. Since her last visit she began tamoxifen. She has noted increased heaviness in the pelvis, but denies frank abdominal pain. She denies any bladder dysfunction. She has noticed more firm stools and typically does not have constipation. This is also a new symptom since starting tamoxifen. She has minimal hot flashes. She denies any fevers or chills, nausea, or vomiting. She has noted more vaginal lubrication but no postmenopausal bleeding. No other complaints are noted.  ALLERGIES:  is allergic to vicodin [hydrocodone-acetaminophen].  Meds: Current Outpatient Prescriptions  Medication Sig Dispense Refill  . aspirin 81 MG tablet Take 81 mg by mouth daily.    . B Complex-Folic Acid (B COMPLEX-VITAMIN B12 PO) Take 750 mg by mouth daily.    Marland Kitchen denosumab (PROLIA) 60 MG/ML SOLN injection Inject 60 mg into the skin every 6 (six) months. Administer in upper arm, thigh, or abdomen    . Ferrous Sulfate (IRON) 28 MG TABS Take 28 mg by mouth daily.    . Multiple  Vitamins-Minerals (MULTIVITAMIN WOMEN 50+) TABS Take 1 tablet by mouth daily.    . ranitidine (ZANTAC) 150 MG capsule Take 150 mg by mouth 2 (two) times daily.    . RESTASIS 0.05 % ophthalmic emulsion Place 1 drop into both eyes 2 (two) times daily.     Marland Kitchen SYNTHROID 88 MCG tablet Take 1 tablet by mouth daily.    . tamoxifen (NOLVADEX) 20 MG tablet Take 1 tablet (20 mg total) by mouth daily. 90 tablet 3  . valACYclovir (VALTREX) 1000 MG tablet Take 500 mg by mouth daily.    . Vitamin D, Ergocalciferol, (DRISDOL) 50000 units CAPS capsule Take 1 capsule by mouth every 14 (fourteen) days.    . diphenhydrAMINE (BENADRYL) 25 mg capsule Take 25 mg by mouth daily as needed for allergies.     No current facility-administered medications for this encounter.     Physical Findings:  height is 5\' 2"  (1.575 m) and weight is 116 lb 12.8 oz (53 kg). Her oral temperature is 98.2 F (36.8 C). Her blood pressure is 145/77 (abnormal) and her pulse is 72. Her respiration is 18 and oxygen saturation is 100%.  In general this is a well appearing caucasian female in no acute distress. She's alert and oriented x4 and appropriate throughout the examination. Cardiopulmonary assessment is negative for acute distress and she exhibits normal effort. The left breast was examined and reveals no evidence  of desquamation, hyperpigmentation, or breakdown.   Lab Findings: Lab Results  Component Value Date   WBC 10.9 (H) 02/09/2016   HGB 14.9 02/09/2016   HCT 44.7 02/09/2016   MCV 96.1 02/09/2016   PLT 255 02/09/2016     Radiographic Findings: No results found.  Impression/Plan: 1. High grade ER positive, DCIS of the left breast. The patient has been doing well since completion of radiotherapy. We discussed that we would be happy to continue to follow her as needed, but she will also continue to follow up with Dr. Jana Hakim in medical oncology and will continue her prescribed tamoxifen. She was counseled on skin care, as  well as encouraged to consider sunscreen over the upper chest wall to avoid sunburn for the next year.  2. Survivorship. She will be referred to survivorship clinic in the near future by medical oncology, but we emphasized the importance of survivorship care.  3. Pelvic fullness, constipation, and possible vaginal discharge. I will communicate her concerns to Dr. Jana Hakim and we will notify her of his recommendations.      Carola Rhine, PAC

## 2016-05-30 ENCOUNTER — Telehealth: Payer: Self-pay | Admitting: Radiation Oncology

## 2016-05-30 NOTE — Telephone Encounter (Signed)
I spoke with the patient due to her concerns about being able to use detergents on her skin due to her grandson having severe eczema. The patient does not have any restrictions for skin care at this point.

## 2016-06-09 ENCOUNTER — Ambulatory Visit: Payer: Medicare Other | Attending: General Surgery | Admitting: Physical Therapy

## 2016-06-09 DIAGNOSIS — I890 Lymphedema, not elsewhere classified: Secondary | ICD-10-CM | POA: Diagnosis present

## 2016-06-09 DIAGNOSIS — Z483 Aftercare following surgery for neoplasm: Secondary | ICD-10-CM | POA: Diagnosis present

## 2016-06-09 DIAGNOSIS — R29898 Other symptoms and signs involving the musculoskeletal system: Secondary | ICD-10-CM | POA: Insufficient documentation

## 2016-06-09 NOTE — Therapy (Signed)
[place], [place], [address] Phone: [phone]   Fax:  [phone]  Physical Therapy Evaluation  Patient Details  Name: Paula Dawson MRN: [id number] Date of Birth: [birth year] Referring Provider: Dr. [name]  Encounter Date: 06/09/2016      PT End of Session - 06/09/16 2049    Visit Number 1   Number of Visits 4   Date for PT Re-Evaluation 07/13/16   PT Start Time 1520   PT Stop Time 1608   PT Time Calculation (min) 48 min   Activity Tolerance Patient tolerated treatment well   Behavior During Therapy [facility] for tasks assessed/performed      Past Medical History:  Diagnosis Date  . Anemia   . Arthritis   . Cancer [facility])    loeft breast cancer  . Dry eye syndrome   . Early cataract   . Family history of breast cancer   . Family history of ovarian cancer   . Family history of prostate cancer   . Gall stones   . GERD (gastroesophageal reflux disease)   . History of hiatal hernia   . HSV infection    pos I and II  . Kidney stones   . Macular degeneration of both eyes   . Osteopenia   . Osteoporosis 1998   Off Fosomax 2008, Prolia 03/17/11, 09/14/11, 03/16/12  . Thyroid disease age 78   hypo  . Wears glasses     Past Surgical History:  Procedure Laterality Date  . AUGMENTATION MAMMAPLASTY  1981  . BREAST IMPLANT REMOVAL  1982  . BREAST LUMPECTOMY WITH RADIOACTIVE SEED AND SENTINEL LYMPH NODE BIOPSY Left 02/11/2016   Procedure: LEFT BREAST LUMPECTOMY WITH RADIOACTIVE SEED AND LEFT SENTINEL LYMPH NODE BIOPSY;  Surgeon: [name], MD;  Location: [place];  Service: General;  Laterality: Left;  . COLONOSCOPY  10/04   Dr Watt Climes  . COLONOSCOPY  04/07/08   normal - Dr. Watt Climes  . ELBOW SURGERY Left 12/1997  . ESOPHAGOGASTRODUODENOSCOPY ENDOSCOPY  08/2012   [place] hernia with GERD  . FOOT NEUROMA SURGERY Left 2004?  . TONSILLECTOMY AND ADENOIDECTOMY    . TUBAL LIGATION  1981    There  were no vitals filed for this visit.       Subjective Assessment - 06/09/16 1523    Subjective Here because of swelling since breast surgery, which never went down, and then had radiation, so nothing ever changed.  Dr. Barry Dienes said she has a little lymphedema in the breast.   Pertinent History Left lumpectomy and 2 lymph nodes removed on 02/11/16; nodes were negative as were margins, all DCIS. She is on tamoxifen.  Hypothyroidism. Osteoporosis. Acid reflux.    Patient Stated Goals learn how to do massage to redirect fluid from the breast   Currently in Pain? 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 [name]   Onset Date/Surgical Date 02/11/16   Hand Dominance Right   Prior Therapy none     Precautions   Precautions Other (comment)   Precaution Comments cancer precautions     Restrictions   Weight Bearing Restrictions No     Balance Screen  Has the patient fallen in the past 6 months No   Has the patient had a decrease in activity level because of a fear of falling?  No   Is the patient reluctant to leave their home because of a fear of falling?  No     Home Environment   Living Environment Private residence   Living Arrangements Alone   Type of [place] to enter   Entrance Stairs-Number of Steps [place] One level     Prior Function   Level of [place] Retired   Leisure walks 30 minutes a day most every day; also swings her arms up when she does that     Cognition   Overall Cognitive Status Within Functional Limits for tasks assessed     Observation/Other Assessments   Observations left breast appears clearly fuller than right   Skin Integrity left breast just slightly pink  compared to right     ROM / Strength   AROM / PROM / Strength AROM     AROM   AROM Assessment Site Shoulder   Right/Left Shoulder Right;Left   Right Shoulder ABduction 177 Degrees   Left Shoulder ABduction 162 Degrees  pulling, discomfort           LYMPHEDEMA/ONCOLOGY QUESTIONNAIRE - 06/09/16 1542      Lymphedema Assessments   Lymphedema Assessments Upper extremities     Right Upper Extremity Lymphedema   10 cm Proximal to Olecranon Process 25.2 cm   Olecranon Process 23.1 cm   10 cm Proximal to Ulnar Styloid Process 18.9 cm   Just Proximal to Ulnar Styloid Process 14.2 cm   Across Hand at [employer] 17.9 cm   At [place] of 2nd Digit 5.6 cm     Left Upper Extremity Lymphedema   10 cm Proximal to Olecranon Process 26.4 cm   Olecranon Process 22.8 cm   10 cm Proximal to Ulnar Styloid Process 18.4 cm   Just Proximal to Ulnar Styloid Process 14.2 cm   Across Hand at [employer] 17.2 cm   At [place] of 2nd Digit 5.4 cm                [facility] Adult PT Treatment/Exercise - 06/09/16 0001      Self-Care   Self-Care Other Self-Care Comments   Other Self-Care Comments  Began discussing compression bras.  Cut 2 layers of 1/4 inch gray foam rectangles and placed in stockinette; placed this in between skin and patient's bra at lateral band and instructed in its use.                        [facility] [facility] Goals - 06/09/16 2101      CC Long Term Goal  #1   Title Pt. will be knowledgeable about self manual lymph drainage   Time 4   Period Weeks   Status New     CC Long Term Goal  #2   Title Pt. will be knowledgeable about compression garments and their role in minimizing therapy   Time 4   Period Weeks   Status New     CC Long Term Goal  #3   Title Knowledgeable about scar massage.   Time 4   Period Weeks   Status New            Plan - 06/09/16 2050  Clinical Impression Statement Patient who is s/p left breast lumpectomy and two  nodes removed for DCIS 02/11/16. She now has visible and palpable swelling of left breast and area lateral to breast.  She is wearing a sports-type bra but it is not very compressive.  She also has limited mobility of left breast incision.   Rehab Potential Good   Clinical Impairments Affecting Rehab Potential none   PT Frequency --  2-3 visits total; pt. wants to minimize number of visits   PT Duration 4 weeks   PT Treatment/Interventions ADLs/Self Care Home Management;DME Instruction;Patient/family education;Manual techniques;Manual lymph drainage;Compression bandaging;Scar mobilization;Taping   PT Next Visit Plan Begin manual lymph drainage to left breast and instructing patient in same; also instruct further about compression bras (where and how to obtain).  Scar mobilization left upper flank.) (Minimize visits and focus on self-care.)   Consulted and Agree with Plan of Care Patient      Patient will benefit from skilled therapeutic intervention in order to improve the following deficits and impairments:  Increased edema, Increased fascial restricitons, Decreased knowledge of use of DME, Decreased knowledge of precautions  Visit Diagnosis: Lymphedema, not elsewhere classified - Plan: PT plan of care cert/re-cert  Other symptoms and signs involving the musculoskeletal system - Plan: PT plan of care cert/re-cert  Aftercare following surgery for neoplasm - Plan: PT plan of care cert/re-cert      G-Codes - 0000000 09-05-01    Functional Assessment Tool Used clinical judgement   Functional Limitation Self care   Self Care Current Status [id number]) At least 80 percent but less than 100 percent impaired, limited or restricted   Self Care Goal Status [id number]) At least 1 percent but less than 20 percent impaired, limited or restricted       Problem List Patient Active Problem List   Diagnosis Date Noted  . Genetic testing 03/22/2016  . Family history of breast cancer   . Family history of  ovarian cancer   . Family history of prostate cancer   . Breast cancer of upper-outer quadrant of left female breast ([place]) 01/20/2016  . Bronchiectasis without acute exacerbation ([place]) 12/11/2015    SALISBURY,DONNA 06/09/2016, 9:09 PM  [place], [place], [address] Phone: [phone]   Fax:  [phone]  Name: Paula Dawson MRN: [id number] Date of Birth: 06-17-1945   Serafina Royals, PT 06/09/16 9:10 PM

## 2016-06-15 ENCOUNTER — Ambulatory Visit: Payer: Medicare Other | Admitting: Physical Therapy

## 2016-06-15 DIAGNOSIS — R29898 Other symptoms and signs involving the musculoskeletal system: Secondary | ICD-10-CM

## 2016-06-15 DIAGNOSIS — I890 Lymphedema, not elsewhere classified: Secondary | ICD-10-CM

## 2016-06-15 DIAGNOSIS — Z483 Aftercare following surgery for neoplasm: Secondary | ICD-10-CM

## 2016-06-15 NOTE — Patient Instructions (Signed)
Self manual lymph drainage: Perform this sequence once a day.  Only give enough pressure no your skin to make the skin move.  Diaphragmatic - Supine   Inhale through nose making navel move out toward hands. Exhale through puckered lips, hands follow navel in. Repeat _5__ times. Rest _10__ seconds between repeats.   Copyright  VHI. All rights reserved.  Hug yourself.  Do circles at your neck just above your collarbones.  Repeat this 10 times.  Axilla - One at a Time   Using full weight of flat hand and fingers at center of uninvolved armpit, make _10__ in-place circles.   Copyright  VHI. All rights reserved.  LEG: Inguinal Nodes Stimulation   With small finger side of hand against hip crease on involved side, gently perform circles at the crease. Repeat __10_ times.   Copyright  VHI. All rights reserved.  1) Axilla to Inguinal Nodes - pump   On involved side, pump _4__ times from armpit along side of trunk to hip crease.  Now gently stretch skin from the involved side to the uninvolved side across the chest at the shoulder line.  Repeat that 4 times.  Draw an imaginary diagonal line from upper outer breast through the nipple area toward lower inner breast.  Direct fluid upward and inward from this line toward the pathway across your upper chest .  Do this in three rows to treat all of the upper inner breast tissue, and do each row 3-4x.      Direct fluid to treat all of lower outer breast tissue downward and outward toward      pathway that is aimed at the left groin.  Finish by doing the pathways as described above going from your involved armpit to the same side groin and going across your upper chest from the involved shoulder to the uninvolved shoulder.  Repeat the steps above where you do circles in your left groin and right armpit. Copyright  VHI. All rights reserved.

## 2016-06-15 NOTE — Therapy (Signed)
[place], [place], [address] Phone: [phone]   Fax:  [phone]  Physical Therapy Treatment  Patient Details  Name: Paula Dawson MRN: [id number] Date of Birth: 11/25/1945 Referring Provider: Dr. [name]  Encounter Date: 06/15/2016      PT End of Session - 06/15/16 1233    Visit Number 2   Number of Visits 4   Date for PT Re-Evaluation 07/13/16   PT Start Time 0845   PT Stop Time 0935   PT Time Calculation (min) 50 min   Activity Tolerance Patient tolerated treatment well   Behavior During Therapy [facility] for tasks assessed/performed      Past Medical History:  Diagnosis Date  . Anemia   . Arthritis   . Cancer [facility])    loeft breast cancer  . Dry eye syndrome   . Early cataract   . Family history of breast cancer   . Family history of ovarian cancer   . Family history of prostate cancer   . Gall stones   . GERD (gastroesophageal reflux disease)   . History of hiatal hernia   . HSV infection    pos I and II  . Kidney stones   . Macular degeneration of both eyes   . Osteopenia   . Osteoporosis 1998   Off Fosomax 2008, Prolia 03/17/11, 09/14/11, 03/16/12  . Thyroid disease age 84   hypo  . Wears glasses     Past Surgical History:  Procedure Laterality Date  . AUGMENTATION MAMMAPLASTY  1981  . BREAST IMPLANT REMOVAL  1982  . BREAST LUMPECTOMY WITH RADIOACTIVE SEED AND SENTINEL LYMPH NODE BIOPSY Left 02/11/2016   Procedure: LEFT BREAST LUMPECTOMY WITH RADIOACTIVE SEED AND LEFT SENTINEL LYMPH NODE BIOPSY;  Surgeon: [name], MD;  Location: [place];  Service: General;  Laterality: Left;  . COLONOSCOPY  10/04   Dr Watt Climes  . COLONOSCOPY  04/07/08   normal - Dr. Watt Climes  . ELBOW SURGERY Left 12/1997  . ESOPHAGOGASTRODUODENOSCOPY ENDOSCOPY  08/2012   [place] hernia with GERD  . FOOT NEUROMA SURGERY Left 2004?  . TONSILLECTOMY AND ADENOIDECTOMY    . TUBAL LIGATION  1981    There  were no vitals filed for this visit.      Subjective Assessment - 06/15/16 0851    Subjective Got a new compression bra at [facility] and wears that today.  She will try that with an extra pad in the cup. She has been wearing her other bra at night, and feels like her swelling may be down a little, but it has not been comfortable to wear it for sleeping.   Currently in Pain? No/denies               LYMPHEDEMA/ONCOLOGY QUESTIONNAIRE - 06/15/16 0856      Left Upper Extremity Lymphedema   15 cm Proximal to Olecranon Process 26.4 cm   10 cm Proximal to Olecranon Process 26.1 cm                  [facility] Adult PT Treatment/Exercise - 06/15/16 0001      Self-Care   Other Self-Care Comments  Checked the sports bra that the patient got and discussed how it should fit: she plans to add an extra pad to the left side to increase compression, and to continue foam pad at left flank.     Manual Therapy   Manual Therapy Edema management;Manual Lymphatic Drainage (MLD)  Edema Management took measurements of both upper arms   Manual Lymphatic Drainage (MLD) Instructed patient in diaphragmatic breathing, then in principles and techniques of manual lymph drainage and did the following in supine:  supraclavicular fossae, right axilla and anterior interaxillary anastomosis, left groin and axillo-inguinal anastomosis, and left breast, directing toward pathways.  Then had patient perform this herself, with instruction.                PT Education - 06/15/16 1232    Education provided Yes   Education Details manual lymph drainage for left breast   Person(s) Educated Patient   Methods Explanation;Demonstration;Tactile cues;Verbal cues;Handout   Comprehension Verbalized understanding;Returned demonstration                [facility] Goals - 06/09/16 2101      CC Long Term Goal  #1   Title Pt. will be knowledgeable about self manual lymph drainage   Time 4   Period  Weeks   Status New     CC Long Term Goal  #2   Title Pt. will be knowledgeable about compression garments and their role in minimizing therapy   Time 4   Period Weeks   Status New     CC Long Term Goal  #3   Title Knowledgeable about scar massage.   Time 4   Period Weeks   Status New            Plan - 06/15/16 1233    Clinical Impression Statement Patient did well with initial treatment and instruction in left breast manual lymph drainage today.  Will need review.  Left upper arm had reduced slightly by beginning of session; breast appeared to have reduced and patient thought it might have.   Rehab Potential Good   Clinical Impairments Affecting Rehab Potential none   PT Frequency --  2-3 total visits   PT Duration 4 weeks   PT Treatment/Interventions ADLs/Self Care Home Management;DME Instruction;Patient/family education;Manual techniques;Manual lymph drainage;Compression bandaging;Scar mobilization;Taping   PT Next Visit Plan Review self-manual lymph drainage of left breast with patient.  Scar mobilization left upper flank.  Check on compression bras.  Check goals.   Consulted and Agree with Plan of Care Patient      Patient will benefit from skilled therapeutic intervention in order to improve the following deficits and impairments:  Increased edema, Increased fascial restricitons, Decreased knowledge of use of DME, Decreased knowledge of precautions  Visit Diagnosis: Lymphedema, not elsewhere classified  Other symptoms and signs involving the musculoskeletal system  Aftercare following surgery for neoplasm     Problem List Patient Active Problem List   Diagnosis Date Noted  . Genetic testing 03/22/2016  . Family history of breast cancer   . Family history of ovarian cancer   . Family history of prostate cancer   . Breast cancer of upper-outer quadrant of left female breast ([place]) 01/20/2016  . Bronchiectasis without acute exacerbation ([place]) 12/11/2015     Hildur Bayer 06/15/2016, 12:38 PM  [place], [place], [address] Phone: [phone]   Fax:  [phone]  Name: Paula Dawson MRN: [id number] Date of Birth: 04/17/1946  Serafina Royals, PT 06/15/16 12:38 PM

## 2016-06-22 ENCOUNTER — Ambulatory Visit: Payer: Medicare Other | Attending: General Surgery | Admitting: Physical Therapy

## 2016-06-22 DIAGNOSIS — R29898 Other symptoms and signs involving the musculoskeletal system: Secondary | ICD-10-CM | POA: Diagnosis present

## 2016-06-22 DIAGNOSIS — I890 Lymphedema, not elsewhere classified: Secondary | ICD-10-CM | POA: Insufficient documentation

## 2016-06-22 DIAGNOSIS — Z483 Aftercare following surgery for neoplasm: Secondary | ICD-10-CM | POA: Diagnosis present

## 2016-06-22 NOTE — Therapy (Signed)
[place], [place], [address] Phone: [phone]   Fax:  [phone]  Physical Therapy Treatment  Patient Details  Name: Paula Dawson MRN: [id number] Date of Birth: [birth year] Referring Provider: Dr. [name]  Encounter Date: 06/22/2016      PT End of Session - 06/22/16 0939    Visit Number 3   Date for PT Re-Evaluation 07/13/16   PT Start Time 0849   PT Stop Time 0937   PT Time Calculation (min) 48 min   Behavior During Therapy [facility] for tasks assessed/performed      Past Medical History:  Diagnosis Date  . Anemia   . Arthritis   . Cancer [facility])    loeft breast cancer  . Dry eye syndrome   . Early cataract   . Family history of breast cancer   . Family history of ovarian cancer   . Family history of prostate cancer   . Gall stones   . GERD (gastroesophageal reflux disease)   . History of hiatal hernia   . HSV infection    pos I and II  . Kidney stones   . Macular degeneration of both eyes   . Osteopenia   . Osteoporosis 1998   Off Fosomax 2008, Prolia 03/17/11, 09/14/11, 03/16/12  . Thyroid disease age 18   hypo  . Wears glasses     Past Surgical History:  Procedure Laterality Date  . AUGMENTATION MAMMAPLASTY  1981  . BREAST IMPLANT REMOVAL  1982  . BREAST LUMPECTOMY WITH RADIOACTIVE SEED AND SENTINEL LYMPH NODE BIOPSY Left 02/11/2016   Procedure: LEFT BREAST LUMPECTOMY WITH RADIOACTIVE SEED AND LEFT SENTINEL LYMPH NODE BIOPSY;  Surgeon: [name], MD;  Location: [place];  Service: General;  Laterality: Left;  . COLONOSCOPY  10/04   Dr Watt Climes  . COLONOSCOPY  04/07/08   normal - Dr. Watt Climes  . ELBOW SURGERY Left 12/1997  . ESOPHAGOGASTRODUODENOSCOPY ENDOSCOPY  08/2012   [place] hernia with GERD  . FOOT NEUROMA SURGERY Left 2004?  . TONSILLECTOMY AND ADENOIDECTOMY    . TUBAL LIGATION  1981    There were no vitals filed for this visit.      Subjective Assessment - 06/22/16  0849    Subjective (P)  "I watched the DVD a lot.  Maybe it will help me.  She does it very differently from you."  Went back to [facility] and got the smaller size bra; is wearing that during the day and the other one at night.   Currently in Pain? (P)  No/denies                         [facility] Adult PT Treatment/Exercise - 06/22/16 0001      Self-Care   Other Self-Care Comments  Discussed compression bras further, showed patient pictures of some options and wrote down suggestions for places and styles to look into.  Gave patient extra 1/4 inch and 1/2 inch gray foam and stockinette for future use.     Manual Therapy   Manual Lymphatic Drainage (MLD) Had patient perform manual lymph drainage with cueing, correction, and answering questions:  short neck, superficial and deep abdomen, right axilla and anterior interaxillary anastomosis, left groin and axillo-inguinal anastomosis, and left breast, directing to pathways.  She performed this in sitting.                PT Education - 06/22/16 9373  Education provided Yes   Education Details manual lymph drainage review; about compression bras   Person(s) Educated Patient   Methods Explanation;Verbal cues;Handout   Comprehension Verbalized understanding;Returned demonstration                [facility] Goals - July 18, 2016 0932      CC Long Term Goal  #1   Title Pt. will be knowledgeable about self manual lymph drainage   Status Achieved     CC Long Term Goal  #2   Title Pt. will be knowledgeable about compression garments and their role in minimizing therapy   Status Achieved     CC Long Term Goal  #3   Title Knowledgeable about scar massage.   Status Deferred            Plan - 07/18/16 0940    Clinical Impression Statement Patient performed manual lymph drainage with cueing, asked and had her questions answered about proper technique.  Discussed compression bras and their use in some detail.   Patient feels ready for discharge today.  She will see Dr. Barry Dienes for follow-up this summer, and will have Dr. Barry Dienes check her swelling and determine if she needs any further follow-up.   Rehab Potential Good   Clinical Impairments Affecting Rehab Potential none   PT Treatment/Interventions ADLs/Self Care Home Management;DME Instruction;Patient/family education;Manual techniques;Manual lymph drainage;Compression bandaging;Scar mobilization;Taping   PT Next Visit Plan None.  Discharge today.   Consulted and Agree with Plan of Care Patient      Patient will benefit from skilled therapeutic intervention in order to improve the following deficits and impairments:  Increased edema, Increased fascial restricitons, Decreased knowledge of use of DME, Decreased knowledge of precautions  Visit Diagnosis: Lymphedema, not elsewhere classified  Other symptoms and signs involving the musculoskeletal system  Aftercare following surgery for neoplasm       G-Codes - 07-18-2016 0942    Functional Assessment Tool Used clinical judgement   Functional Limitation Self care   Self Care Goal Status ([id number]) At least 1 percent but less than 20 percent impaired, limited or restricted   Self Care Discharge Status [phone]) At least 1 percent but less than 20 percent impaired, limited or restricted      Problem List Patient Active Problem List   Diagnosis Date Noted  . Genetic testing 03/22/2016  . Family history of breast cancer   . Family history of ovarian cancer   . Family history of prostate cancer   . Breast cancer of upper-outer quadrant of left female breast ([place]) 01/20/2016  . Bronchiectasis without acute exacerbation ([place]) 12/11/2015    Paula Dawson 07-18-2016, 9:47 AM  [place], [place], [address] Phone: [phone]   Fax:  [phone]  Name: [name] MRN: [id number] Date of Birth:  [birth year]  PHYSICAL THERAPY DISCHARGE SUMMARY  Visits from Start of Care: 3  Current functional level related to goals / functional outcomes: Two goals met and one deferred as noted above.   Remaining deficits: Left breast will continue to require management.   Education / Equipment: Self-manual lymph drainage, about compression bras, foam pads for extra compression to be worn between bra and skin. Plan: Patient agrees to discharge.  Patient goals were met. Patient is being discharged due to meeting the stated rehab goals.  ?????    She feel ready for discharge today and knows we are available to her should she have needs in the future.  Serafina Royals, PT 06/22/16 9:47 AM

## 2016-06-27 ENCOUNTER — Encounter (HOSPITAL_COMMUNITY): Payer: Self-pay

## 2016-08-02 ENCOUNTER — Telehealth: Payer: Self-pay | Admitting: Adult Health

## 2016-08-02 NOTE — Telephone Encounter (Signed)
Patient is aware of new appt for Survivorship scheduled. Reminder letter sent.

## 2016-08-17 ENCOUNTER — Telehealth: Payer: Self-pay | Admitting: *Deleted

## 2016-08-17 NOTE — Telephone Encounter (Signed)
Patient inquired about the Survivorship program, stating  "Do not feel that I need this appointment at this time. Have an appointment in September with Magrinat, MD and will re- evaluate my situation then." Appointment cancelled, patient knows to follow up if she has any questions or concerns.

## 2016-08-29 ENCOUNTER — Ambulatory Visit (INDEPENDENT_AMBULATORY_CARE_PROVIDER_SITE_OTHER): Payer: Medicare Other | Admitting: Nurse Practitioner

## 2016-08-29 ENCOUNTER — Encounter: Payer: Self-pay | Admitting: Nurse Practitioner

## 2016-08-29 VITALS — BP 114/66 | HR 64 | Ht 62.0 in | Wt 120.0 lb

## 2016-08-29 DIAGNOSIS — E559 Vitamin D deficiency, unspecified: Secondary | ICD-10-CM | POA: Diagnosis not present

## 2016-08-29 DIAGNOSIS — Z0000 Encounter for general adult medical examination without abnormal findings: Secondary | ICD-10-CM

## 2016-08-29 DIAGNOSIS — C50412 Malignant neoplasm of upper-outer quadrant of left female breast: Secondary | ICD-10-CM | POA: Diagnosis not present

## 2016-08-29 DIAGNOSIS — Z1371 Encounter for nonprocreative screening for genetic disease carrier status: Secondary | ICD-10-CM

## 2016-08-29 DIAGNOSIS — Z8481 Family history of carrier of genetic disease: Secondary | ICD-10-CM

## 2016-08-29 DIAGNOSIS — Z01411 Encounter for gynecological examination (general) (routine) with abnormal findings: Secondary | ICD-10-CM | POA: Diagnosis not present

## 2016-08-29 DIAGNOSIS — Z171 Estrogen receptor negative status [ER-]: Secondary | ICD-10-CM

## 2016-08-29 MED ORDER — VITAMIN D (ERGOCALCIFEROL) 1.25 MG (50000 UNIT) PO CAPS: 50000.0000 [IU] | ORAL_CAPSULE | ORAL | 3 refills | Status: DC

## 2016-08-29 MED ORDER — VALACYCLOVIR HCL 1 G PO TABS: 500.0000 mg | ORAL_TABLET | Freq: Every day | ORAL | 4 refills | Status: DC

## 2016-08-29 NOTE — Progress Notes (Signed)
71 y.o. G2P0001 Divorced  Caucasian Fe here for annual exam.  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   Estrogen negative and progesterone only slight positive.  She is now on Tamoxifen.  There are now 10 family members (first and second degree) with breast cancer.  The genetic testing was negative 03/2016.  She does have problems with lymphedema of the left breast.  She is using PT and massage therapy to help with this.     Last Prolia injection 06/10/16. Not dating or SA.  Patient's last menstrual period was 05/16/2000 (approximate).          Sexually active: No.  The current method of family planning is post menopausal status.    Exercising: Yes.    walking Smoker:  no  Health Maintenance: Pap: 05/29/14, Negative with neg HR HPV  02/13/12, Negative with neg HR HPV History of Abnormal Pap: No  MMG:01/06/16 with breast cancer diagnosis on 01/15/16 Colonoscopy: 04/08/13, repeat in 5 years BMD:04/29/15 T Score, -2.9 Spine / -2.1 Right Femur Neck / -2.3 Left Femur Neck TDaP: 02/08/11 Shingles: 11/2009 Pneumonia: 2015, Prevnar 13 - 2016 Hep C: 08/19/15 Labs: PCP takes care of all labs   reports that she has never smoked. She has never used smokeless tobacco. She reports that she does not drink alcohol or use drugs.  Past Medical History:  Diagnosis Date  . Anemia   . Arthritis   . BRCA gene mutation negative 03/2016  . Breast cancer in female [facility]) 12/2015   left breast cancer, Estrogen negative, progesterone slight positive  . Dry eye syndrome   . Early cataract   . Family history of breast cancer   . Family history of ovarian cancer   . Family history of prostate cancer   . Gall stones   . GERD (gastroesophageal reflux disease)   . History of hiatal hernia   . HSV infection    pos I and II  . Kidney stones   . Macular degeneration of both eyes   . Osteopenia   . Osteoporosis 1998   Off  Fosomax 2008, Prolia 03/17/11, 09/14/11, 03/16/12  . Thyroid disease age 33   hypo  . Wears glasses     Past Surgical History:  Procedure Laterality Date  . AUGMENTATION MAMMAPLASTY  1981  . BREAST IMPLANT REMOVAL  1982  . BREAST LUMPECTOMY WITH RADIOACTIVE SEED AND SENTINEL LYMPH NODE BIOPSY Left 02/11/2016   Procedure: LEFT BREAST LUMPECTOMY WITH RADIOACTIVE SEED AND LEFT SENTINEL LYMPH NODE BIOPSY;  Surgeon: [name], MD;  Location: [facility];  Service: General;  Laterality: Left;  . COLONOSCOPY  10/04   Dr Watt Climes  . COLONOSCOPY  04/07/08   normal - Dr. Watt Climes  . ELBOW SURGERY Left 12/1997  . ESOPHAGOGASTRODUODENOSCOPY ENDOSCOPY  08/2012   [place] hernia with GERD  . FOOT NEUROMA SURGERY Left 2004?  . TONSILLECTOMY AND ADENOIDECTOMY    . TUBAL LIGATION  1981    Current Outpatient Prescriptions  Medication Sig Dispense Refill  . aspirin 81 MG tablet Take 81 mg by mouth daily.    . B Complex-Folic Acid (B COMPLEX-VITAMIN B12 PO) Take 750 mg by mouth daily.    Marland Kitchen Bioflavonoid Products (C COMPLEX PO) Take by mouth 1 day or 1 dose.    Marland Kitchen denosumab (PROLIA) 60 MG/ML SOLN injection Inject 60 mg into the skin every 6 (six) months. Administer in  upper arm, thigh, or abdomen    . Ferrous Sulfate (IRON) 28 MG TABS Take 28 mg by mouth daily.    . Multiple Vitamins-Minerals (ICAPS AREDS 2) CAPS Take by mouth 2 (two) times daily.    . Multiple Vitamins-Minerals (MULTIVITAMIN WOMEN 50+) TABS Take 1 tablet by mouth daily.    . ranitidine (ZANTAC) 150 MG capsule Take 150 mg by mouth 2 (two) times daily.    . RESTASIS 0.05 % ophthalmic emulsion Place 1 drop into both eyes 2 (two) times daily.     Marland Kitchen SYNTHROID 88 MCG tablet Take 1 tablet by mouth daily.    . tamoxifen (NOLVADEX) 20 MG tablet Take 1 tablet (20 mg total) by mouth daily. 90 tablet 3  . valACYclovir (VALTREX) 1000 MG tablet Take 0.5 tablets (500 mg total) by mouth daily. 90 tablet 4  . Vitamin D, Ergocalciferol, (DRISDOL) 50000 units CAPS  capsule Take 1 capsule (50,000 Units total) by mouth every 14 (fourteen) days. 30 capsule 3   No current facility-administered medications for this visit.     Family History  Problem Relation Age of Onset  . Heart failure Mother   . Prostate cancer Father 59  . Liver cancer Father   . Colon polyps Sister   . Breast cancer Sister 33  . Breast cancer Sister 81  . Ovarian cancer Cousin     died at 9  . Breast cancer Sister 5    recurrance at 64  . Breast cancer Sister 47  . Colon polyps Sister   . Breast cancer Other     X 4 + age 21, 23 with recurrence, 11, 90  . Lung cancer Brother     smoked  . Breast cancer Paternal Aunt   . Prostate cancer Brother 72  . Melanoma Brother   . Colon cancer Other 50    ROS:  Pertinent items are noted in HPI.  Otherwise, a comprehensive ROS was negative.  Exam:   BP 114/66 (BP Location: Right Arm, Patient Position: Sitting, Cuff Size: Normal)   Pulse 64   Ht '5\' 2"'  (1.575 m)   Wt 120 lb (54.4 kg)   LMP 05/16/2000 (Approximate)   BMI 21.95 kg/m  Height: '5\' 2"'  (157.5 cm) Ht Readings from Last 3 Encounters:  08/29/16 '5\' 2"'  (1.575 m)  05/19/16 '5\' 2"'  (1.575 m)  04/25/16 '5\' 2"'  (1.575 m)    General appearance: alert, cooperative and appears stated age Head: Normocephalic, without obvious abnormality, atraumatic Neck: no adenopathy, supple, symmetrical, trachea midline and thyroid normal to inspection and palpation Lungs: clear to auscultation bilaterally Breasts: normal appearance, no masses or tenderness, on the right with surgical and radiation changes on the left.  Heart: regular rate and rhythm Abdomen: soft, non-tender; no masses,  no organomegaly Extremities: extremities normal, atraumatic, no cyanosis or edema Skin: Skin color, texture, turgor normal. No rashes or lesions Lymph nodes: Cervical, supraclavicular, and axillary nodes normal. No abnormal inguinal nodes palpated Neurologic: Grossly normal   Pelvic: External  genitalia:  no lesions              Urethra:  normal appearing urethra with no masses, tenderness or lesions              Bartholin's and Skene's: normal                 Vagina: normal appearing vagina with normal color and discharge, no lesions  Cervix: anteverted              Pap taken: No. Bimanual Exam:  Uterus:  normal size, contour, position, consistency, mobility, non-tender              Adnexa: no mass, fullness, tenderness               Rectovaginal: Confirms               Anus:  normal sphincter tone, no lesions  Chaperone present: yes  A:  Well Woman with normal exam  Postmenopausal HRT 1997-11/02 [facility] for Breast Cancer Atrophic vaginitis - now off Vaginal estrogen History of HSV I /II History of Vit D deficiency Osteoporosis - off Fosamax, took Prolia X 4 doses - now back on Prolia with last injection 05/2016  New diagnosis of left breast cancer DCIS, grade 3, estrogen negative, progesterone slight positive. On Tamoxifen.   P:   Reviewed health and wellness pertinent to exam  Pap smear not done  Refill Vit D and follow with labs.  Counseled on breast self exam, mammography screening, adequate intake of calcium and vitamin D, diet and exercise, Kegel's exercises return annually or prn  An After Visit Summary was printed and given to the patient.

## 2016-08-29 NOTE — Patient Instructions (Signed)

## 2016-08-30 LAB — VITAMIN D 25 HYDROXY (VIT D DEFICIENCY, FRACTURES): VIT D 25 HYDROXY: 45 ng/mL (ref 30–100)

## 2016-09-01 ENCOUNTER — Telehealth: Payer: Self-pay | Admitting: Nurse Practitioner

## 2016-09-01 NOTE — Telephone Encounter (Signed)
Spoke with patient. Patient states she was seen by Kem Boroughs, NP on Monday and received refill for valacyclovir at that time. Patient states RX came in mail today but was not for the amount that she usually gets, RX was not written as it has been in past. Patient states she usually gets 90 tablets and RX written as take one pill daily. Patient states she will sometimes have to take 2 with outbreak. Patient states usually takes 500mg  daily and only received 45 tablets. Advised patient that 45 tablets is a 90 day supply when cut in half. Patient states she has gotten 90 tablets in the past, this would cover any outbreaks. Patient states she has enough pills, would like RX changed for future refills. Advised patient Kem Boroughs, NP is out of the office today, will review when she returns on 4/20 and return call with recommendations, patient is agreeable.  Kem Boroughs, NP-please review and advise?

## 2016-09-01 NOTE — Progress Notes (Signed)
Encounter reviewed by Dr. Brook Amundson C. Silva.  

## 2016-09-01 NOTE — Telephone Encounter (Signed)
Patient would like to speak with nurse about a prescription. 

## 2016-09-02 ENCOUNTER — Encounter: Payer: Medicare Other | Admitting: Adult Health

## 2016-09-02 NOTE — Telephone Encounter (Signed)
Looking back in her chart I only see the Valtrex 1 gm size ordered.  She is to take 500 mg (1/2 GM) daily and then to increase with flares to BID.  When she was in for AEX - I just refilled what was already there at Valtrex 1GM size - 1/2 tablet daily and prn # 90/4.  The pharmacy or insurance has made the decision to change quantity.

## 2016-09-02 NOTE — Telephone Encounter (Signed)
Spoke with patient, advised as seen below per Kem Boroughs, NP. Patient states this is the first time med has been filled this year, will f/u with [facility] for clarification and return call if any additional questions. Patient verbalizes understanding and is agreeable.  Routing to provider for final review. Patient is agreeable to disposition. Will close encounter.

## 2016-09-16 ENCOUNTER — Encounter: Payer: Self-pay | Admitting: Internal Medicine

## 2016-09-16 ENCOUNTER — Ambulatory Visit (INDEPENDENT_AMBULATORY_CARE_PROVIDER_SITE_OTHER): Payer: Medicare Other | Admitting: Internal Medicine

## 2016-09-16 ENCOUNTER — Ambulatory Visit (INDEPENDENT_AMBULATORY_CARE_PROVIDER_SITE_OTHER)
Admission: RE | Admit: 2016-09-16 | Discharge: 2016-09-16 | Disposition: A | Discharge status: Home / Self Care | Payer: Medicare Other | Source: Ambulatory Visit | Attending: Internal Medicine | Admitting: Internal Medicine

## 2016-09-16 VITALS — BP 126/68 | HR 74 | Ht 62.0 in | Wt 118.8 lb

## 2016-09-16 DIAGNOSIS — J479 Bronchiectasis, uncomplicated: Secondary | ICD-10-CM

## 2016-09-16 NOTE — Patient Instructions (Addendum)
Please see patient coordinator before you leave today  to schedule CT without contrast in early September and I will call you with results  For colds >  advil cold and sinus  For drippy nose/allergies > zyrtec  (can take with advil cold and sinus if you need to)    No regular follow up needed here needed since you have such a good PCP

## 2016-09-16 NOTE — Progress Notes (Signed)
Subjective:    Patient ID: Paula Dawson, female    DOB: 10-Mar-1946,    MRN: [id number]    Brief patient profile:  56 yowf  Never smoker with unexplained wt loss/ lower abd pain > ct Abd 05/12/16  > CT chest 11/13/15 c/w bronchiectasis so referred to pulmonary clinic 12/11/2015 by Dr Harlan Stains    History of Present Illness  12/11/2015 1st [facility] office visit/ Paula Dawson   Chief Complaint  Patient presents with  . Pulmonary Consult    Referred by Dr. Caren Griffins white for eval of pulmonary nodules. Pt c/o non prod cough for 3 days.   pt was told sickly as child #10/10 children and remembers a lot of infections as child and occ visits to the doctor but never admitted and by hs better but only "avg athlete" in terms of aerobic activities with no pna as adult that she can recall and minimal tendency to cough or am excess/ purulent mucus and no h/o hemoptysis She says the abd pain/ wt loss the resulted in the ct abd /dx of bronchiectasis have resolved though no dx rendered. 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 Follow-up    pt c/o sinus congestion, pnd.  denies other breathing complaints currently.     Not limited by breathing from desired activities    No obvious day to day or daytime variability or assoc excess/ purulent sputum or mucus plugs or hemoptysis or cp or chest tightness, subjective wheeze or overt sinus or hb symptoms. No unusual exp hx or h/o childhood pna/ asthma or knowledge of premature birth.  Sleeping ok without nocturnal  or early am exacerbation  of respiratory  c/o's or need for noct saba. Also denies any obvious fluctuation of  symptoms with weather or environmental changes or other aggravating or alleviating factors except as outlined above   Current Medications, Allergies, Complete Past Medical History, Past Surgical History, Family History, and Social History were reviewed in [employer] record.  ROS  The following are not active complaints unless bolded sore throat, dysphagia, dental problems, itching, sneezing,  nasal congestion or excess/ purulent secretions, ear ache,   fever, chills, sweats, unintended wt loss, classically pleuritic or exertional cp,  orthopnea pnd or leg swelling, presyncope, palpitations, abdominal pain, anorexia, nausea, vomiting, diarrhea  or change in bowel or bladder habits, change in stools or urine, dysuria,hematuria,  rash, arthralgias, visual complaints, headache, numbness, weakness or ataxia or problems with walking or coordination,  change in mood/affect or memory.                     Objective:   Physical Exam  amb wf nad   09/16/2016        119   12/11/15 113 lb 3.2 oz (51.3 kg)  08/19/15 117 lb (53.1 kg)  04/21/15 116 lb (52.6 kg)    Vital signs reviewed   - Note on arrival 02 sats  97% on RA     HEENT: nl dentition, turbinates, and oropharynx. Nl external ear canals without cough reflex   NECK :  without JVD/Nodes/TM/ nl carotid upstrokes  bilaterally   LUNGS: no acc muscle use,  Nl contour chest which is clear to A and P bilaterally without cough on insp or exp maneuvers   CV:  RRR  no s3 or murmur or increase in P2, no edema   ABD:  soft and nontender with nl inspiratory excursion in the supine position. No bruits or organomegaly, bowel sounds nl  MS:  Nl gait/ ext warm without deformities, calf tenderness, cyanosis or clubbing No obvious joint restrictions   SKIN: warm and dry without lesions    NEURO:  alert, approp, nl sensorium with  no motor deficits          CXR PA and Lateral:   09/16/2016 :    I personally reviewed  images and agree with radiology impression as follows:   Hyperinflation. Question left mid lung nodule. This could be further evaluated with noncontrast chest CT.   My review:  Looks like overlapping shadows only seen on PA view            Assessment & Plan:

## 2016-09-17 NOTE — Assessment & Plan Note (Addendum)
See ct chest 11/13/15 c/w RML bronchiectasis/ mpns - CxR 09/16/2016  ? LUL nodule > CT s contrast 01/17/17 ordered   She feels she's coming down with a cold now but typically not bothered by cough/ excess am mucus   I had an extended discussion with the patient reviewing all relevant studies completed to date and  lasting 15 to 20 minutes of a 25 minute visit on the following ongoing concerns:   Reviewed again the mechanism of RML syndrome/ bronchiectasis and the fact it can't be cured and the rx is proportionate to the symptoms which chronically are minimal so rx sinus complaints with advil cold and sinus  For colds and zyrtec for water rhinitis of any cause should be all she needs for now   f/u CT chest ordered based on radiologies concerns but regular pulmonary f/u not needed since she has such an excellent PCP.  Each maintenance medication was reviewed in detail including most importantly the difference between maintenance and as needed and under what circumstances the prns are to be used.  Please see AVS for specific  Instructions which are unique to this visit and I personally typed out  which were reviewed in detail in writing with the patient and a copy provided.

## 2016-11-15 ENCOUNTER — Ambulatory Visit (INDEPENDENT_AMBULATORY_CARE_PROVIDER_SITE_OTHER): Payer: Medicare Other | Admitting: Orthopaedic Surgery

## 2016-11-22 ENCOUNTER — Ambulatory Visit (INDEPENDENT_AMBULATORY_CARE_PROVIDER_SITE_OTHER): Payer: Medicare Other | Admitting: Orthopaedic Surgery

## 2016-11-22 ENCOUNTER — Encounter (INDEPENDENT_AMBULATORY_CARE_PROVIDER_SITE_OTHER): Payer: Self-pay | Admitting: Orthopaedic Surgery

## 2016-11-22 VITALS — BP 129/81 | HR 72 | Ht 62.0 in | Wt 121.0 lb

## 2016-11-22 DIAGNOSIS — M7541 Impingement syndrome of right shoulder: Secondary | ICD-10-CM | POA: Diagnosis not present

## 2016-11-22 DIAGNOSIS — M25811 Other specified joint disorders, right shoulder: Secondary | ICD-10-CM

## 2016-11-22 HISTORY — DX: Other specified joint disorders, right shoulder: M25.811

## 2016-11-22 HISTORY — DX: Impingement syndrome of right shoulder: M75.41

## 2016-11-22 MED ORDER — METHYLPREDNISOLONE ACETATE 40 MG/ML IJ SUSP
40.0000 mg | INTRAMUSCULAR | Status: AC | PRN
  Administered 2016-11-22: 40 mg via INTRA_ARTICULAR

## 2016-11-22 MED ORDER — BUPIVACAINE HCL 0.25 % IJ SOLN
4.0000 mL | INTRAMUSCULAR | Status: AC | PRN
  Administered 2016-11-22: 4 mL via INTRA_ARTICULAR

## 2016-11-22 MED ORDER — LIDOCAINE HCL 1 % IJ SOLN
1.0000 mL | INTRAMUSCULAR | Status: AC | PRN
  Administered 2016-11-22: 1 mL

## 2016-11-22 NOTE — Progress Notes (Signed)
Office Visit Note   Patient: Paula Dawson           Date of Birth: 1946/03/08           MRN: [id number] Visit Date: 11/22/2016              Requested by: Harlan Stains, MD [address], [place] [address] PCP: Harlan Stains, MD   Assessment & Plan: Visit Diagnoses:  1. Impingement syndrome of right shoulder       Likely related to yard work and digging she was doing.  Plan: Subacromial injection performed with significant improvement in range of motion and relief of pain. She has persistent problems she'll call us know. We discussed activity modification of avoiding repetitive outstretched reaching overhead activities for. Time. She'll call she has persistent problems.  Follow-Up Instructions: No Follow-up on file.   Orders:  Orders Placed This Encounter  Procedures  . Large Joint Injection/Arthrocentesis   No orders of the defined types were placed in this encounter.     Procedures: Large Joint Inj Date/Time: 11/22/2016 3:21 PM Performed by: Marybelle Killings Authorized by: Marybelle Killings   Consent Given by:  Patient Indications:  Pain Location:  Shoulder Site:  R subacromial bursa Needle Size:  22 G Needle Length:  1.5 inches Ultrasound Guidance: No   Fluoroscopic Guidance: No   Arthrogram: No   Medications:  4 mL bupivacaine 0.25 %; 1 mL lidocaine 1 %; 40 mg methylPREDNISolone acetate 40 MG/ML Aspiration Attempted: No   Patient tolerance:  Patient tolerated the procedure well with no immediate complications      Clinical Data: No additional findings.   Subjective: Chief Complaint  Patient presents with  . Right Elbow - Pain    HPI: Transfer right shoulder pain that radiates down to her elbow she had previous lateral epicondylar release by me greater than 10 years ago. In the interim she's had breast cancer on the left with lymph node removal has been using her right arm more. She doesn't want transplant set up there in a garden  and later noted some increased pain in her shoulder problems with overhead outstretched reaching fixing her hair getting dressed with pain that radiates down to the deltoid insertion and then down the lateral epicondyle.  Review of Systems positive for left breast cancer with some lymph node removal. She denies any problems with left upper extremity edema or swelling. 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 14 point review of systems otherwise negative as it pertains to history of present illness.   Objective: Vital Signs: BP 129/81 (BP Location: Right Arm, Patient Position: Sitting)   Pulse 72   Ht _0  (1.575 m)   Wt 121 lb (54.9 kg)   LMP 05/16/2000 (Approximate)   BMI 22.13 kg/m   Physical Exam  Constitutional: She is oriented to person, place, and time. She appears well-developed.  HENT:  Head: Normocephalic.  Right Ear: External ear normal.  Left Ear: External ear normal.  Eyes: Pupils are equal, round, and reactive to light.  Neck: No tracheal deviation present. No thyromegaly present.  Cardiovascular: Normal rate.   Pulmonary/Chest: Effort normal.  Abdominal: Soft.  Musculoskeletal:  Positive impingement right shoulder long head of the biceps minimally tender she has pain with the external rotation without evidence of adhesive capsulitis. Pain with resisted supraspinatus testing. Negative subluxation of the shoulder. Good cervical range of motion no brachial plexus tenderness. M  reflexes are 2+. Well healed right lateral epicondylar release incision. No tenderness in the area and no pain with wrist extension. Biceps tendon proximally and distally is normal. No medial, tenderness no evidence peripheral nerve entrapment no thenar or hyperthenar atrophy.  Neurological: She is alert and oriented to person, place, and time.  Skin: Skin is warm and dry.  Psychiatric: She has a normal mood and affect. Her behavior is normal.    Ortho  Exam  Specialty Comments:  No specialty comments available.  Imaging: No results found.   PMFS History: Patient Active Problem List   Diagnosis Date Noted  . Genetic testing 03/22/2016  . Family history of breast cancer   . Family history of ovarian cancer   . Family history of prostate cancer   . Breast cancer of upper-outer quadrant of left female breast ([place]) 01/20/2016  . Bronchiectasis without acute exacerbation ([facility])  with MPNs 12/11/2015   Past Medical History:  Diagnosis Date  . Anemia   . Arthritis   . BRCA gene mutation negative 03/2016  . Breast cancer in female [facility]) 12/2015   left breast cancer, Estrogen negative, progesterone slight positive  . Dry eye syndrome   . Early cataract   . Family history of breast cancer   . Family history of ovarian cancer   . Family history of prostate cancer   . Gall stones   . GERD (gastroesophageal reflux disease)   . History of hiatal hernia   . HSV infection    pos I and II  . Kidney stones   . Macular degeneration of both eyes   . Osteopenia   . Osteoporosis 1998   Off Fosomax 2008, Prolia 03/17/11, 09/14/11, 03/16/12  . Thyroid disease age 45   hypo  . Wears glasses     Family History  Problem Relation Age of Onset  . Heart failure Mother   . Prostate cancer Father 36  . Liver cancer Father   . Colon polyps Sister   . Breast cancer Sister 37  . Breast cancer Sister 73  . Ovarian cancer Cousin        died at 81  . Breast cancer Sister 55       recurrance at 91  . Breast cancer Sister 31  . Colon polyps Sister   . Breast cancer Other        X 4 + age 34, 78 with recurrence, 73, 31  . Lung cancer Brother        smoked  . Breast cancer Paternal Aunt   . Prostate cancer Brother 86  . Melanoma Brother   . Colon cancer Other 80    Past Surgical History:  Procedure Laterality Date  . AUGMENTATION MAMMAPLASTY  1981  . BREAST IMPLANT REMOVAL  1982  . BREAST LUMPECTOMY WITH RADIOACTIVE SEED AND SENTINEL LYMPH  NODE BIOPSY Left 02/11/2016   Procedure: LEFT BREAST LUMPECTOMY WITH RADIOACTIVE SEED AND LEFT SENTINEL LYMPH NODE BIOPSY;  Surgeon: [name], MD;  Location: [facility];  Service: General;  Laterality: Left;  . COLONOSCOPY  10/04   Dr Watt Climes  . COLONOSCOPY  04/07/08   normal - Dr. Watt Climes  . ELBOW SURGERY Left 12/1997  . ESOPHAGOGASTRODUODENOSCOPY ENDOSCOPY  08/2012   [place] hernia with GERD  . FOOT NEUROMA SURGERY Left 2004?  . TONSILLECTOMY AND ADENOIDECTOMY    . TUBAL LIGATION  1981   Social History   Occupational History  . Not on file.   Social History Main  Topics  . Smoking status: Never Smoker  . Smokeless tobacco: Never Used  . Alcohol use No  . Drug use: No  . Sexual activity: No

## 2016-11-27 ENCOUNTER — Telehealth: Payer: Self-pay

## 2016-11-27 NOTE — Telephone Encounter (Signed)
Called and left a message with a new appt due to dr gm out of the office  Paula Dawson

## 2016-12-23 ENCOUNTER — Ambulatory Visit
Admission: RE | Admit: 2016-12-23 | Discharge: 2016-12-23 | Disposition: A | Discharge status: Home / Self Care | Payer: Medicare Other | Source: Ambulatory Visit | Attending: Internal Medicine | Admitting: Internal Medicine

## 2016-12-23 DIAGNOSIS — I700 Atherosclerosis of aorta: Secondary | ICD-10-CM

## 2016-12-23 DIAGNOSIS — J479 Bronchiectasis, uncomplicated: Secondary | ICD-10-CM

## 2016-12-23 HISTORY — DX: Atherosclerosis of aorta: I70.0

## 2017-01-05 ENCOUNTER — Telehealth: Payer: Self-pay | Admitting: *Deleted

## 2017-01-05 NOTE — Telephone Encounter (Signed)
Patient called with questions for Shona Simpson, PA.  Patient states she is going to have dermatologist appointment and possible biopsy for suspected skin cancer and she is concerned about having any local injections or biopsies to that area with the left arm restrictions post lymph node removal.  Also questioned if you can develop lymphedema in both the breast and the arm as she has existing left breast lymphedema.  Final question was regarding her dry cough and wanted to know if radiation would cause a side effect of dry cough.  Relayed responses from Shona Simpson, PA to patient at her request.

## 2017-01-13 ENCOUNTER — Other Ambulatory Visit: Payer: Self-pay | Admitting: Oncology

## 2017-01-13 ENCOUNTER — Other Ambulatory Visit: Payer: Self-pay | Admitting: Family Medicine

## 2017-01-13 DIAGNOSIS — Z853 Personal history of malignant neoplasm of breast: Secondary | ICD-10-CM

## 2017-01-19 ENCOUNTER — Other Ambulatory Visit: Payer: Self-pay

## 2017-01-19 ENCOUNTER — Other Ambulatory Visit: Payer: Self-pay | Admitting: Oncology

## 2017-01-19 DIAGNOSIS — Z853 Personal history of malignant neoplasm of breast: Secondary | ICD-10-CM

## 2017-01-20 ENCOUNTER — Ambulatory Visit
Admission: RE | Admit: 2017-01-20 | Discharge: 2017-01-20 | Disposition: A | Discharge status: Home / Self Care | Payer: Medicare Other | Source: Ambulatory Visit | Attending: Oncology | Admitting: Oncology

## 2017-01-20 HISTORY — DX: Personal history of irradiation: Z92.3

## 2017-01-25 ENCOUNTER — Other Ambulatory Visit: Payer: Medicare Other

## 2017-01-31 ENCOUNTER — Telehealth: Payer: Self-pay | Admitting: Oncology

## 2017-01-31 ENCOUNTER — Ambulatory Visit (HOSPITAL_BASED_OUTPATIENT_CLINIC_OR_DEPARTMENT_OTHER): Payer: Medicare Other | Admitting: Oncology

## 2017-01-31 DIAGNOSIS — D0512 Intraductal carcinoma in situ of left breast: Secondary | ICD-10-CM | POA: Diagnosis not present

## 2017-01-31 DIAGNOSIS — N951 Menopausal and female climacteric states: Secondary | ICD-10-CM | POA: Diagnosis not present

## 2017-01-31 DIAGNOSIS — Z79810 Long term (current) use of selective estrogen receptor modulators (SERMs): Secondary | ICD-10-CM | POA: Diagnosis not present

## 2017-01-31 DIAGNOSIS — Z170 Estrogen receptor positive status [ER+]: Secondary | ICD-10-CM

## 2017-01-31 DIAGNOSIS — R05 Cough: Secondary | ICD-10-CM | POA: Diagnosis not present

## 2017-01-31 MED ORDER — TAMOXIFEN CITRATE 20 MG PO TABS
20.0000 mg | ORAL_TABLET | Freq: Every day | ORAL | 3 refills | Status: DC
Start: 2017-01-31 — End: 2018-02-13

## 2017-01-31 NOTE — Progress Notes (Signed)
[place]  [phone] [phone]     ID: Paula Dawson DOB: 04-27-1946  MR#: [id number]  JYN#:[id number]  Patient Care Team: Harlan Stains, MD as PCP - General (Family Medicine) [name], MD as Consulting Physician (General Surgery) Velna Hedgecock, Virgie Dad, MD as Consulting Physician (Oncology) Kyung Rudd, MD as Consulting Physician (Radiation Oncology) Kem Boroughs, FNP as Nurse Practitioner (Family Medicine) OTHER MD:  CHIEF COMPLAINT: Ductal carcinoma in situ  CURRENT TREATMENT: Tamoxifen   BREAST CANCER HISTORY: From the original intake note:  Paula Dawson had bilateral screening mammography at the [facility] 01/06/2016, showing a possible mass in the left breast. On 01/13/2016 she had left diagnostic mammography with tomography and left breast ultrasonography. The breast density was category C. In the upper outer quadrant of the left breast there was an area of asymmetry measuring 0.8 cm. On physical exam there was no palpable mass. Ultrasonography confirmed an irregular hypoechoic mass at the 2:00 position 6 cm from the nipple measuring 1.4 cm. Ultrasound of the left axilla was benign.  On 01/15/2016 the patient underwent biopsy of the left breast mass in question, and this showed ([id number]) ductal carcinoma in situ, grade 3, estrogen receptor negative, progesterone receptor questionably positive (20% with weak staining intensity).  Her subsequent history is as detailed below  INTERVAL HISTORY: Paula Dawson returns today for follow-up of her ductal carcinoma in situ. She is on tamoxifen, with intermittent hot flashes. She notes that she is doing well overall. Pt reports that she helps with her grandchildren, ages 58 and 64. She notes that she has been visiting her sister in [facility] who has Alzheimer's. Pt was evaluated by her PCP for her cough that is intermittently productive x 4 months and her PCP contributed her symptoms to the pt hx  of GERD. Pt was placed on prescription omeprazole to alleviate her symptoms and has been taking it for 3 weeks. She denies PMHx of asthma or HTN.  In February of this year she was evaluated by Dr. Barry Dienes who felt there was a little bit of lymphedema involving the left breast. The patient was referred for manual drainage and is now wearing sports brass as much as tolerated.  REVIEW OF SYSTEMS: Paula Dawson reports cough that is intermittently prodocutive x 4 months. Pt denies post-nasal drip. Pt notes that she ambulates indoor for her exercise. She denies unusual headaches, visual changes, nausea, vomiting, or dizziness. There has been no unusual cough, phlegm production, or pleurisy. This been no change in bowel or bladder habits. She denies unexplained fatigue or unexplained weight loss, bleeding, rash, or fever. A detailed review of systems was otherwise negative.    PAST MEDICAL HISTORY: Past Medical History:  Diagnosis Date  . Anemia   . Arthritis   . BRCA gene mutation negative 03/2016  . Breast cancer ([place])    left 2017  . Breast cancer in female [facility]) 12/2015   left breast cancer, Estrogen negative, progesterone slight positive  . Dry eye syndrome   . Early cataract   . Family history of breast cancer   . Family history of ovarian cancer   . Family history of prostate cancer   . Gall stones   . GERD (gastroesophageal reflux disease)   . History of hiatal hernia   . HSV infection    pos I and II  . Kidney stones   . Macular degeneration of both eyes   . Osteopenia   . Osteoporosis 1998   Off Fosomax  2008, Prolia 03/17/11, 09/14/11, 03/16/12  . Personal history of radiation therapy   . Thyroid disease age 66   hypo  . Wears glasses     PAST SURGICAL HISTORY: Past Surgical History:  Procedure Laterality Date  . AUGMENTATION MAMMAPLASTY  1981  . BREAST IMPLANT REMOVAL  1982  . BREAST LUMPECTOMY     left 2017  . BREAST LUMPECTOMY WITH RADIOACTIVE SEED AND SENTINEL LYMPH NODE  BIOPSY Left 02/11/2016   Procedure: LEFT BREAST LUMPECTOMY WITH RADIOACTIVE SEED AND LEFT SENTINEL LYMPH NODE BIOPSY;  Surgeon: [name], MD;  Location: [facility];  Service: General;  Laterality: Left;  . COLONOSCOPY  10/04   Dr Watt Climes  . COLONOSCOPY  04/07/08   normal - Dr. Watt Climes  . ELBOW SURGERY Left 12/1997  . ESOPHAGOGASTRODUODENOSCOPY ENDOSCOPY  08/2012   [place] hernia with GERD  . FOOT NEUROMA SURGERY Left 2004?  . TONSILLECTOMY AND ADENOIDECTOMY    . TUBAL LIGATION  1981    FAMILY HISTORY Family History  Problem Relation Age of Onset  . Heart failure Mother   . Prostate cancer Father 66  . Liver cancer Father   . Colon polyps Sister   . Breast cancer Sister 55  . Breast cancer Sister 71  . Ovarian cancer Cousin        died at 16  . Breast cancer Sister 45       recurrance at 72  . Breast cancer Sister 72  . Colon polyps Sister   . Breast cancer Other        X 4 + age 54, 13 with recurrence, 57, 77  . Lung cancer Brother        smoked  . Breast cancer Paternal Aunt   . Prostate cancer Brother 29  . Melanoma Brother   . Colon cancer Other 68  The patient's father died at age 46. He had been diagnosed with prostate "and liver" cancer at the age of 35. The patient's mother died at age 24. The patient had 4 brothers, 5 sisters. One brother was diagnosed with lung cancer at age 8. She has 4 sisters with breast cancer, one initially diagnosed at age 63, recurring at age 110.  GYNECOLOGIC HISTORY:  Patient's last menstrual period was 05/16/2000 (approximate). Menarche age 8, first live birth age 54, the patient is GX P1. She stopped having periods approximately 2002. She used hormone replacement for approximately 5 years. 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 She is divorced and lives alone, with 1. Her son Sahalie Beth lives in [place]. His  wife is a Cabin crew and he is her handyman. The patient has 2 grandsons. She attends a [employer] in the [place] area    ADVANCED DIRECTIVES: In place; the patient has named her son [name] as her healthcare part of attorney. He can be reached at [phone].   HEALTH MAINTENANCE: Social History  Substance Use Topics  . Smoking status: Never Smoker  . Smokeless tobacco: Never Used  . Alcohol use No     Colonoscopy: 2014/May got  PAP:  Bone density: December 2016, T score -2.9   Allergies  Allergen Reactions  . Vicodin [Hydrocodone-Acetaminophen] Other (See Comments)    Pt prefers not to take because it knocks her out    Current Outpatient Prescriptions  Medication Sig Dispense Refill  . aspirin 81 MG  tablet Take 81 mg by mouth daily.    . B Complex-Folic Acid (B COMPLEX-VITAMIN B12 PO) Take 750 mg by mouth daily.    Marland Kitchen Bioflavonoid Products (C COMPLEX PO) Take by mouth 1 day or 1 dose.    . Carboxymethylcellulose Sod PF (RETAINE CMC) 0.5 % SOLN Apply 1 drop to eye 4 (four) times daily as needed.    . Cholecalciferol (VITAMIN D3) 5000 units CAPS Take 1 capsule by mouth daily.    Marland Kitchen denosumab (PROLIA) 60 MG/ML SOLN injection Inject 60 mg into the skin every 6 (six) months. Administer in upper arm, thigh, or abdomen    . Ferrous Sulfate (IRON) 28 MG TABS Take 28 mg by mouth daily.    . Multiple Vitamins-Minerals (ICAPS AREDS 2) CAPS Take by mouth 2 (two) times daily.    . Multiple Vitamins-Minerals (MULTIVITAMIN WOMEN 50+) TABS Take 1 tablet by mouth daily.    Marland Kitchen omeprazole (PRILOSEC) 40 MG capsule     . RESTASIS 0.05 % ophthalmic emulsion Place 1 drop into both eyes 2 (two) times daily.     Marland Kitchen SYNTHROID 100 MCG tablet Take 1 tablet by mouth daily.    . tamoxifen (NOLVADEX) 20 MG tablet Take 1 tablet (20 mg total) by mouth daily. 90 tablet 3  . valACYclovir (VALTREX) 1000 MG tablet Take 0.5 tablets (500 mg total) by mouth daily. 90 tablet 4  . ranitidine (ZANTAC) 150 MG  capsule Take 150 mg by mouth 2 (two) times daily.      No current facility-administered medications for this visit.     OBJECTIVE: Middle-aged white woman Who appears well  Vitals:   01/31/17 1009  BP: 137/89  Pulse: 78  Resp: 18  Temp: 98.3 F (36.8 C)  SpO2: 98%     Body mass index is 22.86 kg/m.    ECOG FS:0 - Asymptomatic  Sclerae unicteric, EOMs intact Oropharynx clear and moist No cervical or supraclavicular adenopathy Lungs no rales or rhonchi Heart regular rate and rhythm Abd soft, nontender, positive bowel sounds MSK no focal spinal tenderness, no upper extremity lymphedema Neuro: nonfocal, well oriented, appropriate affect Breasts: The right breast is benign. The left breast is status post lumpectomy followed by radiation with no evidence of local recurrence. Both axillae are benign.   LAB RESULTS:  CMP     Component Value Date/Time   NA 141 02/09/2016 1328   NA 142 01/27/2016 1223   K 3.7 02/09/2016 1328   K 3.6 01/27/2016 1223   CL 108 02/09/2016 1328   CO2 25 02/09/2016 1328   CO2 27 01/27/2016 1223   GLUCOSE 110 (H) 02/09/2016 1328   GLUCOSE 131 01/27/2016 1223   BUN 14 02/09/2016 1328   BUN 14.8 01/27/2016 1223   CREATININE 0.73 02/09/2016 1328   CREATININE 0.8 01/27/2016 1223   CALCIUM 9.1 02/09/2016 1328   CALCIUM 9.1 01/27/2016 1223   PROT 6.6 01/27/2016 1223   ALBUMIN 3.6 01/27/2016 1223   AST 21 01/27/2016 1223   ALT 21 01/27/2016 1223   ALKPHOS 93 01/27/2016 1223   BILITOT 0.36 01/27/2016 1223   GFRNONAA >60 02/09/2016 1328   GFRAA >60 02/09/2016 1328    INo results found for: SPEP, UPEP  Lab Results  Component Value Date   WBC 10.9 (H) 02/09/2016   NEUTROABS 7.0 (H) 01/27/2016   HGB 14.9 02/09/2016   HCT 44.7 02/09/2016   MCV 96.1 02/09/2016   PLT 255 02/09/2016      Chemistry  Component Value Date/Time   NA 141 02/09/2016 1328   NA 142 01/27/2016 1223   K 3.7 02/09/2016 1328   K 3.6 01/27/2016 1223   CL 108  02/09/2016 1328   CO2 25 02/09/2016 1328   CO2 27 01/27/2016 1223   BUN 14 02/09/2016 1328   BUN 14.8 01/27/2016 1223   CREATININE 0.73 02/09/2016 1328   CREATININE 0.8 01/27/2016 1223      Component Value Date/Time   CALCIUM 9.1 02/09/2016 1328   CALCIUM 9.1 01/27/2016 1223   ALKPHOS 93 01/27/2016 1223   AST 21 01/27/2016 1223   ALT 21 01/27/2016 1223   BILITOT 0.36 01/27/2016 1223       No results found for: LABCA2  No components found for: LABCA125  No results for input(s): INR in the last 168 hours.  Urinalysis    Component Value Date/Time   BILIRUBINUR - 05/29/2014 1012   PROTEINUR - 05/29/2014 1012   UROBILINOGEN negative 05/29/2014 1012   NITRITE - 05/29/2014 1012   LEUKOCYTESUR Negative 05/29/2014 1012     STUDIES: Mm Diag Breast Tomo Bilateral  Result Date: 01/20/2017 CLINICAL DATA:  71 year old patient was diagnosed with left breast cancer 2017. Strong family history of breast cancer. EXAM: 2D DIGITAL DIAGNOSTIC BILATERAL MAMMOGRAM WITH CAD AND ADJUNCT TOMO COMPARISON:  Previous exam(s). ACR Breast Density Category c: The breast tissue is heterogeneously dense, which may obscure small masses. FINDINGS: There are lumpectomy changes in the deep superior left breast. No mass, nonsurgical distortion, or suspicious microcalcification is identified in either breast to suggest malignancy. Mammographic images were processed with CAD. IMPRESSION: Lumpectomy changes in the deep superior left breast. No evidence of malignancy. RECOMMENDATION: Diagnostic mammogram is suggested in 1 year. (Code:[id number]-B-01Y) I have discussed the findings and recommendations with the patient. Results were also provided in writing at the conclusion of the visit. If applicable, a reminder letter will be sent to the patient regarding the next appointment. BI-RADS CATEGORY  2: Benign. Electronically Signed   By: Curlene Dolphin M.D.   On: 01/20/2017 11:37   On 12/23/2016 she had a CT of the chest without  contrast compared to a year prior for follow-up of pulmonary nodules, with no CT findings suspicious for metastatic disease and no new or acute findings. There were stable radiation changes involving the left anterior lung ELIGIBLE FOR AVAILABLE RESEARCH PROTOCOL: no  ASSESSMENT: 71 y.o. [place] woman status post left breast upper outer quadrant biopsy 01/15/2016 for ductal carcinoma in situ, grade 3, estrogen receptor negative, progesterone receptor questionably positive  (1) Status post left lumpectomy and sentinel lymph node sampling 02/11/2016 for a pTis pN0, stage 0 ductal carcinoma in situ, grade 3, with negative margins.  (2) adjuvant radiation 03/10/2016 through 04/06/2016 Site/dose:   The patient initially received a dose of 42.5 Gy in 17 fractions to the breast using whole-breast tangent fields. This was delivered using a 3-D conformal technique. The patient then received a boost to the seroma. This delivered an additional 7.5 Gy in 3 fractions using a 3 field photon technique due to the depth of the seroma. The total dose was 50 Gy.  (3) consider genetics 03/21/2016 through the Breast/Ovarian cancer gene panel.offered by [facility] found no deleterious mutations in  ATM, BARD1, BRCA1, BRCA2, BRIP1, CDH1, CHEK2, EPCAM, FANCC, MLH1, MSH2, MSH6, NBN, PALB2, PMS2, PTEN, RAD51C, RAD51D, TP53, and XRCC2  (4) tamoxifen started December 2017  PLAN: Paula Dawson is now one year out from definitive surgery for her breast cancer with no  evidence of disease recurrence. This is favorable.  She is tolerating tamoxifen well. I don't think her cough is going to be related. Reflux is a much more likely cause. A second possibility is dry air: She walks indoors not outdoors and she doesn't cracked a window at night so she is basically breathing air-conditioning her in the summer and he did air in the winter, all of which is rather low moisture.  If her cough does not resolve after 4-6 weeks she will let us know.  We can certainly go off tamoxifen for a couple months as a child but I don't think that is going to be helpful  We also went over her CT scan which was very favorable. I do not believe that needs to be repeated.  She will see me again in one year. She knows to call for any problems that may develop before that visit.      Reo Portela, Virgie Dad, MD  01/31/17 10:29 AM Medical Oncology and Hematology [facility] [address] [place], [place] [address] Tel. [phone]    Fax. [phone]   This document serves as a record of services personally performed by Lurline Del, MD. It was created on her behalf by Steva Colder, a trained medical scribe. The creation of this record is based on the scribe's personal observations and the provider's statements to them. This document has been checked and approved by the attending provider.

## 2017-01-31 NOTE — Telephone Encounter (Signed)
Scheduled appt per 9/18 los - f/u in one year - sent reminder letter in the mail.

## 2017-02-06 ENCOUNTER — Ambulatory Visit: Payer: Medicare Other | Admitting: Oncology

## 2017-02-28 ENCOUNTER — Ambulatory Visit (INDEPENDENT_AMBULATORY_CARE_PROVIDER_SITE_OTHER): Payer: Medicare Other | Admitting: Orthopaedic Surgery

## 2017-04-18 ENCOUNTER — Other Ambulatory Visit: Payer: Self-pay | Admitting: Family Medicine

## 2017-04-18 DIAGNOSIS — M810 Age-related osteoporosis without current pathological fracture: Secondary | ICD-10-CM

## 2017-05-04 ENCOUNTER — Ambulatory Visit
Admission: RE | Admit: 2017-05-04 | Discharge: 2017-05-04 | Disposition: A | Discharge status: Home / Self Care | Payer: Medicare Other | Source: Ambulatory Visit | Attending: Family Medicine | Admitting: Family Medicine

## 2017-05-04 DIAGNOSIS — M810 Age-related osteoporosis without current pathological fracture: Secondary | ICD-10-CM

## 2017-07-12 ENCOUNTER — Telehealth: Payer: Self-pay | Admitting: Obstetrics and Gynecology

## 2017-07-12 NOTE — Telephone Encounter (Signed)
Spoke with patient. Reports urinary frequency and pressure for the past couple of months, felt her bladder was "dropping". Last night she noticed a bulge for the first time when urinating.   Patient states she is able to void, feels like bladder empties completely and denies pain.   Recommended OV for further evaluation, OV scheduled for 07/14/17 at 1:30pm. Patient declined OV offered for 2/28. Patient agreeable to date and time.  Last AEX 08/29/16 with Kem Boroughs, NP   Routing to provider for final review. Patient is agreeable to disposition. Will close encounter.

## 2017-07-12 NOTE — Telephone Encounter (Signed)
Patient calling stating " that something is falling out" ans would like to see Dr.Jertson before her aex.

## 2017-07-14 ENCOUNTER — Ambulatory Visit (INDEPENDENT_AMBULATORY_CARE_PROVIDER_SITE_OTHER): Payer: Medicare Other | Admitting: Obstetrics and Gynecology

## 2017-07-14 ENCOUNTER — Encounter: Payer: Self-pay | Admitting: Obstetrics and Gynecology

## 2017-07-14 ENCOUNTER — Other Ambulatory Visit: Payer: Self-pay

## 2017-07-14 VITALS — BP 134/70 | HR 76 | Resp 14 | Wt 123.0 lb

## 2017-07-14 DIAGNOSIS — N814 Uterovaginal prolapse, unspecified: Secondary | ICD-10-CM | POA: Diagnosis not present

## 2017-07-14 DIAGNOSIS — R350 Frequency of micturition: Secondary | ICD-10-CM

## 2017-07-14 DIAGNOSIS — N3941 Urge incontinence: Secondary | ICD-10-CM

## 2017-07-14 DIAGNOSIS — N841 Polyp of cervix uteri: Secondary | ICD-10-CM | POA: Diagnosis not present

## 2017-07-14 DIAGNOSIS — R3915 Urgency of urination: Secondary | ICD-10-CM

## 2017-07-14 DIAGNOSIS — N8111 Cystocele, midline: Secondary | ICD-10-CM

## 2017-07-14 DIAGNOSIS — Z853 Personal history of malignant neoplasm of breast: Secondary | ICD-10-CM

## 2017-07-14 LAB — POCT URINALYSIS DIPSTICK
BILIRUBIN UA: NEGATIVE
Blood, UA: NEGATIVE
GLUCOSE UA: NEGATIVE
KETONES UA: NEGATIVE
Leukocytes, UA: NEGATIVE
Nitrite, UA: NEGATIVE
Protein, UA: NEGATIVE
Urobilinogen, UA: NEGATIVE E.U./dL — AB
pH, UA: 5 (ref 5.0–8.0)

## 2017-07-14 NOTE — Progress Notes (Signed)
GYNECOLOGY  VISIT   HPI: 72 y.o.   Divorced  Caucasian  female   [id number] with Patient's last menstrual period was 05/16/2000 (approximate).   here c/o urinary frequency and urgency. She is also c/o vaginal pressure.    For the last 5-6 weeks she has had increase in frequency of urination, nocturia 1-2 x. She has some urge incontinence for the last 3 weeks, small amount every morning on the way to the bathroom. She has urgency to void, no pain.  A few days ago when she was in the shower she felt something protruding out of her vagina. She notices the bulge if she coughs. She has a chronic cough, negative evaluation. Coughs some every day (slowly improving).  She voids small to normal amounts. She is voiding about every hour. She drinks 8-10 oz of coffee in the morning and then lots of water. No pain.  She denies any pain. Sometimes when she sits down on the floor she can have a discomfort in her vagina.  Not sexually active, no vaginal bleeding.  Her Oncologist took her off of Tamoxifen to see if it helps her cough. She tried omeprazole, was on it for 2 months, didn't help.   GYNECOLOGIC HISTORY: Patient's last menstrual period was 05/16/2000 (approximate). Contraception:postmenopause  Menopausal hormone therapy: none         OB History    Gravida Para Term Preterm AB Living   _0 0 0 1   SAB TAB Ectopic Multiple Live Births   0 0 0 0 1         Patient Active Problem List   Diagnosis Date Noted  . 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 Genetic testing 03/22/2016  . Family history of breast cancer   . Family history of ovarian cancer   . Family history of prostate cancer   . Breast cancer of upper-outer quadrant of left female breast ([place]) 01/20/2016  . Bronchiectasis without acute exacerbation ([place])  with MPNs 12/11/2015    Past Medical History:  Diagnosis Date  . Anemia   . Arthritis   . BRCA gene mutation negative 03/2016  . Breast cancer ([place])    left  2017  . Breast cancer in female [facility]) 12/2015   left breast cancer, Estrogen negative, progesterone slight positive  . Dry eye syndrome   . Early cataract   . Family history of breast cancer   . Family history of ovarian cancer   . Family history of prostate cancer   . Gall stones   . GERD (gastroesophageal reflux disease)   . History of hiatal hernia   . HSV infection    pos I and II  . Kidney stones   . Macular degeneration of both eyes   . Osteopenia   . Osteoporosis 1998   Off Fosomax 2008, Prolia 03/17/11, 09/14/11, 03/16/12  . Personal history of radiation therapy   . Thyroid disease age 56   hypo  . Wears glasses     Past Surgical History:  Procedure Laterality Date  . AUGMENTATION MAMMAPLASTY  1981  . BREAST IMPLANT REMOVAL  1982  . BREAST LUMPECTOMY     left 2017  . BREAST LUMPECTOMY WITH RADIOACTIVE SEED AND SENTINEL LYMPH NODE BIOPSY Left 02/11/2016   Procedure: LEFT BREAST LUMPECTOMY WITH RADIOACTIVE SEED AND LEFT SENTINEL LYMPH NODE BIOPSY;  Surgeon: [name], MD;  Location: [facility];  Service: General;  Laterality: Left;  . COLONOSCOPY  10/04  Dr Watt Climes  . COLONOSCOPY  04/07/08   normal - Dr. Watt Climes  . ELBOW SURGERY Left 12/1997  . ESOPHAGOGASTRODUODENOSCOPY ENDOSCOPY  08/2012   [place] hernia with GERD  . FOOT NEUROMA SURGERY Left 2004?  . TONSILLECTOMY AND ADENOIDECTOMY    . TUBAL LIGATION  1981    Current Outpatient Medications  Medication Sig Dispense Refill  . aspirin 81 MG tablet Take 81 mg by mouth daily.    . B Complex-Folic Acid (B COMPLEX-VITAMIN B12 PO) Take 750 mg by mouth daily.    Marland Kitchen Bioflavonoid Products (C COMPLEX PO) Take by mouth 1 day or 1 dose.    . Carboxymethylcellulose Sod PF (RETAINE CMC) 0.5 % SOLN Apply 1 drop to eye 4 (four) times daily as needed.    . Cholecalciferol (VITAMIN D3) 5000 units CAPS Take 1 capsule by mouth daily.    Marland Kitchen denosumab (PROLIA) 60 MG/ML SOLN injection Inject 60 mg into the skin every 6 (six) months. Administer  in upper arm, thigh, or abdomen    . Ferrous Sulfate (IRON) 28 MG TABS Take 28 mg by mouth daily.    . Multiple Vitamins-Minerals (ICAPS AREDS 2) CAPS Take by mouth 2 (two) times daily.    . ranitidine (ZANTAC) 150 MG capsule Take 150 mg by mouth 2 (two) times daily.     . RESTASIS 0.05 % ophthalmic emulsion Place 1 drop into both eyes 2 (two) times daily.     Marland Kitchen SYNTHROID 100 MCG tablet Take 1 tablet by mouth daily.    . tamoxifen (NOLVADEX) 20 MG tablet Take 1 tablet (20 mg total) by mouth daily. 90 tablet 3  . valACYclovir (VALTREX) 1000 MG tablet Take 0.5 tablets (500 mg total) by mouth daily. 90 tablet 4   No current facility-administered medications for this visit.      ALLERGIES: Vicodin [hydrocodone-acetaminophen]  Family History  Problem Relation Age of Onset  . Heart failure Mother   . Prostate cancer Father 38  . Liver cancer Father   . Colon polyps Sister   . Breast cancer Sister 27  . Breast cancer Sister 37  . Ovarian cancer Cousin        died at 24  . Breast cancer Sister 19       recurrance at 5  . Breast cancer Sister 72  . Colon polyps Sister   . Breast cancer Other        X 4 + age 13, 34 with recurrence, 16, 43  . Lung cancer Brother        smoked  . Breast cancer Paternal Aunt   . Prostate cancer Brother 30  . Melanoma Brother   . Colon cancer Other 27    Social History   Socioeconomic History  . Marital status: Divorced    Spouse name: Not on file  . Number of children: 1  . Years of education: Not on file  . Highest education level: Not on file  Social Needs  . Financial resource strain: Not on file  . Food insecurity - worry: Not on file  . Food insecurity - inability: Not on file  . Transportation needs - medical: Not on file  . Transportation needs - non-medical: Not on file  Occupational History  . Not on file  Tobacco Use  . Smoking status: Never Smoker  . Smokeless tobacco: Never Used  Substance and Sexual Activity  . Alcohol use:  No  . Drug use: No  . Sexual activity: No  Partners: Male    Birth control/protection: Post-menopausal  Other Topics Concern  . Not on file  Social History Narrative  . Not on file    Review of Systems  Constitutional: Negative.   HENT: Negative.   Eyes: Negative.   Respiratory: Negative.   Cardiovascular: Negative.   Gastrointestinal: Negative.   Genitourinary:       Urinary frequency and urgency. Vaginal pressure with urination   Musculoskeletal: Negative.   Skin: Negative.   Neurological: Negative.   Endo/Heme/Allergies: Negative.   Psychiatric/Behavioral: Negative.     PHYSICAL EXAMINATION:    BP 134/70 (BP Location: Right Arm, Patient Position: Sitting, Cuff Size: Normal)   Pulse 76   Resp 14   Wt 123 lb (55.8 kg)   LMP 05/16/2000 (Approximate)   BMI 22.50 kg/m     General appearance: alert, cooperative and appears stated age Abdomen: soft, non-tender; non distended, no masses,  no organomegaly CVA: not tender  Pelvic: External genitalia:  no lesions              Urethra:  normal appearing urethra with no masses, tenderness or lesions              Bartholins and Skenes: normal                 Vagina: atrophic appearing vagina. Grade 2 cystocele, grade 1-2 uterine prolapse with valsalva and standing.               Cervix: polyp, friable              Bimanual Exam:  Uterus:  normal size, contour, position, consistency, mobility, non-tender              Adnexa: no mass, fullness, tenderness              Rectovaginal: Yes.  .  Confirms.              Anus:  normal sphincter tone, no lesions  Chaperone was present for exam.  ASSESSMENT Grade 2 cystocele and grade 1-2 uterine prolapse Urinary frequency, urgency and mild urge incontinence Cervical polyp noted on exam H/o breast cancer on Tamoxifen    PLAN Discussed that her cough isn't helping her prolapse Discussed trying to avoid heavy lifting and straining Reviewed cystocele and uterine prolapse, ACOG  handout given Send urine for ua, c&s Discussed option of doing nothing, pessary and surgery (if pessary fails) At the moment she will try to decrease her caffeine and monitor her symptoms Return for annual exam next month, will remove polyp at the same time Not a good candidate for vaginal estrogen Discussed the option of medication for the urge incontinence, discussed the side effects.    An After Visit Summary was printed and given to the patient.  ~25 minutes face to face time of which over 50% was spent in counseling.

## 2017-07-15 LAB — URINALYSIS, MICROSCOPIC ONLY
Casts: NONE SEEN /lpf
Epithelial Cells (non renal): >10 /hpf — AB (ref 0–10)
WBC, UA: >30 /hpf — AB (ref 0–?)

## 2017-07-16 LAB — URINE CULTURE

## 2017-07-18 ENCOUNTER — Other Ambulatory Visit: Payer: Self-pay

## 2017-07-18 ENCOUNTER — Ambulatory Visit (INDEPENDENT_AMBULATORY_CARE_PROVIDER_SITE_OTHER): Payer: Medicare Other | Admitting: *Deleted

## 2017-07-18 VITALS — BP 138/78 | HR 70 | Resp 14

## 2017-07-18 DIAGNOSIS — R8290 Unspecified abnormal findings in urine: Secondary | ICD-10-CM | POA: Diagnosis not present

## 2017-07-18 LAB — POCT URINALYSIS DIPSTICK
BILIRUBIN UA: NEGATIVE
GLUCOSE UA: NEGATIVE
KETONES UA: NEGATIVE
Nitrite, UA: NEGATIVE
PH UA: 5 (ref 5.0–8.0)
Protein, UA: NEGATIVE
UROBILINOGEN UA: 0.2 U/dL

## 2017-07-18 NOTE — Progress Notes (Signed)
Patient here in office today for [place]. CCUA obtained and dipped positive for trace of blood. Urine sent for micro per Dr. Talbert Nan. Patient updated and aware will be called with results once Dr. Talbert Nan receives and reviews. Patient agreeable. Patient states she did have some bleeding after her appointment on Friday, but no bleeding today.  Routing to provider for final review. Patient agreeable to disposition. Will close encounter.

## 2017-07-19 LAB — URINALYSIS, MICROSCOPIC ONLY: CASTS: NONE SEEN /LPF

## 2017-07-24 ENCOUNTER — Ambulatory Visit (INDEPENDENT_AMBULATORY_CARE_PROVIDER_SITE_OTHER): Payer: Medicare Other | Admitting: Obstetrics and Gynecology

## 2017-07-24 ENCOUNTER — Encounter: Payer: Self-pay | Admitting: Obstetrics and Gynecology

## 2017-07-24 VITALS — BP 132/76 | HR 70 | Wt 122.0 lb

## 2017-07-24 DIAGNOSIS — N841 Polyp of cervix uteri: Secondary | ICD-10-CM | POA: Diagnosis not present

## 2017-07-24 DIAGNOSIS — R3129 Other microscopic hematuria: Secondary | ICD-10-CM | POA: Diagnosis not present

## 2017-07-24 NOTE — Progress Notes (Signed)
GYNECOLOGY  VISIT   HPI: 72 y.o.   Divorced  Caucasian  female   G46P2001 with Patient's last menstrual period was 05/16/2000 (approximate).  here for cervical polyp removal and straight cath ua.  The patient was noted to have blood in her urine, but also to have epithelial cells (X2). No c/o vaginal bleeding or blood in her urine.   GYNECOLOGIC HISTORY: Patient's last menstrual period was 05/16/2000 (approximate). Contraception:Postmenopausal Menopausal hormone therapy: none        OB History    Gravida Para Term Preterm AB Living   _0 0 0 1   SAB TAB Ectopic Multiple Live Births   0 0 0 0 1         Patient Active Problem List   Diagnosis Date Noted  . 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 Genetic testing 03/22/2016  . Family history of breast cancer   . Family history of ovarian cancer   . Family history of prostate cancer   . Breast cancer of upper-outer quadrant of left female breast ([place]) 01/20/2016  . Bronchiectasis without acute exacerbation ([place])  with MPNs 12/11/2015    Past Medical History:  Diagnosis Date  . Anemia   . Arthritis   . BRCA gene mutation negative 03/2016  . Breast cancer ([place])    left 2017  . Breast cancer in female [facility]) 12/2015   left breast cancer, Estrogen negative, progesterone slight positive  . Dry eye syndrome   . Early cataract   . Family history of breast cancer   . Family history of ovarian cancer   . Family history of prostate cancer   . Gall stones   . GERD (gastroesophageal reflux disease)   . History of hiatal hernia   . HSV infection    pos I and II  . Kidney stones   . Macular degeneration of both eyes   . Osteopenia   . Osteoporosis 1998   Off Fosomax 2008, Prolia 03/17/11, 09/14/11, 03/16/12  . Personal history of radiation therapy   . Thyroid disease age 41   hypo  . Wears glasses     Past Surgical History:  Procedure Laterality Date  . AUGMENTATION MAMMAPLASTY  1981  . BREAST IMPLANT REMOVAL   1982  . BREAST LUMPECTOMY     left 2017  . BREAST LUMPECTOMY WITH RADIOACTIVE SEED AND SENTINEL LYMPH NODE BIOPSY Left 02/11/2016   Procedure: LEFT BREAST LUMPECTOMY WITH RADIOACTIVE SEED AND LEFT SENTINEL LYMPH NODE BIOPSY;  Surgeon: [name], MD;  Location: [place];  Service: General;  Laterality: Left;  . COLONOSCOPY  10/04   Dr Watt Climes  . COLONOSCOPY  04/07/08   normal - Dr. Watt Climes  . ELBOW SURGERY Left 12/1997  . ESOPHAGOGASTRODUODENOSCOPY ENDOSCOPY  08/2012   [place] hernia with GERD  . FOOT NEUROMA SURGERY Left 2004?  . TONSILLECTOMY AND ADENOIDECTOMY    . TUBAL LIGATION  1981    Current Outpatient Medications  Medication Sig Dispense Refill  . aspirin 81 MG tablet Take 81 mg by mouth daily.    Marland Kitchen Bioflavonoid Products (C COMPLEX PO) Take by mouth 1 day or 1 dose.    . Carboxymethylcellulose Sod PF (RETAINE CMC) 0.5 % SOLN Apply 1 drop to eye 4 (four) times daily as needed.    . Cholecalciferol (VITAMIN D3) 5000 units CAPS Take 1 capsule by mouth daily.    Marland Kitchen denosumab (PROLIA) 60 MG/ML SOLN injection Inject 60 mg into the skin  every 6 (six) months. Administer in upper arm, thigh, or abdomen    . Ferrous Sulfate (IRON) 28 MG TABS Take 28 mg by mouth daily.    . Multiple Vitamins-Minerals (ICAPS AREDS 2) CAPS Take by mouth 2 (two) times daily.    . ranitidine (ZANTAC) 150 MG capsule Take 150 mg by mouth 2 (two) times daily.     . RESTASIS 0.05 % ophthalmic emulsion Place 1 drop into both eyes 2 (two) times daily.     Marland Kitchen SYNTHROID 100 MCG tablet Take 1 tablet by mouth daily.    . tamoxifen (NOLVADEX) 20 MG tablet Take 1 tablet (20 mg total) by mouth daily. 90 tablet 3  . valACYclovir (VALTREX) 1000 MG tablet Take 0.5 tablets (500 mg total) by mouth daily. 90 tablet 4   No current facility-administered medications for this visit.      ALLERGIES: Vicodin [hydrocodone-acetaminophen]  Family History  Problem Relation Age of Onset  . Heart failure Mother   . Prostate cancer Father 57   . Liver cancer Father   . Colon polyps Sister   . Breast cancer Sister 58  . Breast cancer Sister 69  . Ovarian cancer Cousin        died at 91  . Breast cancer Sister 38       recurrance at 80  . Breast cancer Sister 89  . Colon polyps Sister   . Breast cancer Other        X 4 + age 50, 41 with recurrence, 7, 9  . Lung cancer Brother        smoked  . Breast cancer Paternal Aunt   . Prostate cancer Brother 79  . Melanoma Brother   . Colon cancer Other 22    Social History   Socioeconomic History  . Marital status: Divorced    Spouse name: Not on file  . Number of children: 1  . Years of education: Not on file  . Highest education level: Not on file  Social Needs  . Financial resource strain: Not on file  . Food insecurity - worry: Not on file  . Food insecurity - inability: Not on file  . Transportation needs - medical: Not on file  . Transportation needs - non-medical: Not on file  Occupational History  . Not on file  Tobacco Use  . Smoking status: Never Smoker  . Smokeless tobacco: Never Used  Substance and Sexual Activity  . Alcohol use: No  . Drug use: No  . Sexual activity: No    Partners: Male    Birth control/protection: Post-menopausal  Other Topics Concern  . Not on file  Social History Narrative  . Not on file    Review of Systems  Constitutional: Negative.   HENT: Negative.   Eyes: Negative.   Respiratory: Negative.   Cardiovascular: Negative.   Gastrointestinal: Negative.   Genitourinary: Positive for frequency and urgency.  Musculoskeletal: Negative.   Skin: Negative.   Neurological: Negative.   Endo/Heme/Allergies: Negative.   Psychiatric/Behavioral: Negative.     PHYSICAL EXAMINATION:    BP 132/76 (BP Location: Right Arm, Patient Position: Sitting, Cuff Size: Normal)   Pulse 70   Wt 122 lb (55.3 kg)   LMP 05/16/2000 (Approximate)   BMI 22.31 kg/m     General appearance: alert, cooperative and appears stated age  Straight  catheterization performed, urine sent for evaluation.   Pelvic: External genitalia:  no lesions  Urethra:  normal appearing urethra with no masses, tenderness or lesions              Bartholins and Skenes: normal                 Vagina: normal appearing vagina with normal color and discharge, no lesions              Cervix: Cervical polyp removed with ringed forceps. Base cauterized with silver nitrate  Chaperone was present for exam.  ASSESSMENT Microscopic hematuria, ? Contamination Cervical polyp    PLAN St cath ua sent Polyp removed   An After Visit Summary was printed and given to the patient.

## 2017-07-25 LAB — URINALYSIS, MICROSCOPIC ONLY
Casts: NONE SEEN /lpf
EPITHELIAL CELLS (NON RENAL): NONE SEEN /HPF (ref 0–10)

## 2017-08-30 ENCOUNTER — Other Ambulatory Visit: Payer: Self-pay

## 2017-08-30 ENCOUNTER — Encounter: Payer: Self-pay | Admitting: Obstetrics and Gynecology

## 2017-08-30 ENCOUNTER — Ambulatory Visit: Payer: Medicare Other | Admitting: Nurse Practitioner

## 2017-08-30 ENCOUNTER — Ambulatory Visit (INDEPENDENT_AMBULATORY_CARE_PROVIDER_SITE_OTHER): Payer: Medicare Other | Admitting: Obstetrics and Gynecology

## 2017-08-30 VITALS — BP 110/66 | HR 70 | Resp 14 | Wt 121.0 lb

## 2017-08-30 DIAGNOSIS — Z01419 Encounter for gynecological examination (general) (routine) without abnormal findings: Secondary | ICD-10-CM

## 2017-08-30 DIAGNOSIS — Z853 Personal history of malignant neoplasm of breast: Secondary | ICD-10-CM

## 2017-08-30 DIAGNOSIS — N63 Unspecified lump in unspecified breast: Secondary | ICD-10-CM

## 2017-08-30 DIAGNOSIS — N6459 Other signs and symptoms in breast: Secondary | ICD-10-CM | POA: Diagnosis not present

## 2017-08-30 DIAGNOSIS — Z8739 Personal history of other diseases of the musculoskeletal system and connective tissue: Secondary | ICD-10-CM | POA: Diagnosis not present

## 2017-08-30 MED ORDER — VALACYCLOVIR HCL 500 MG PO TABS: ORAL_TABLET | ORAL | 3 refills | Status: DC

## 2017-08-30 NOTE — Progress Notes (Signed)
Patient scheduled while in office for right breast Dx MMG and [place] if needed, at [place] on 09/04/17 arriving at 2:20pm for 2:40pm appt. Patient verbalizes understanding and is agreeable.

## 2017-08-30 NOTE — Progress Notes (Signed)
72 y.o. G2P2001 DivorcedCaucasianF here for annual exam. H/O left breast cancer, s/p lumpectomy in 9/17, 2 lymph nodes were removed (negative), and radiation. She is on tamoxifen. Doing okay on it.  Negative genetic testing.    She has a chronic cough, negative evaluation. Taken off of Tamoxifen to see if it helped, didn't so she went back on it. No help with meds for reflux. Overall feels her cough is a little better, taking some allergy medication.  She was seen about 6 weeks ago c/o a vaginal bulge and diagnosed with a grade 2 cystocele and grade 1-2 uterine prolapse. She was c/o urinary frequency at that time. Urine culture was negative for infection.  Decided to cut back on caffeine and monitor her symptoms. Currently feels her symptoms are tolerable.  No vaginal bleeding. Not sexually active for 30 years. She has a h/o genital hsv, is on suppression. She still has a mild outbreak about one x a month.     Patient's last menstrual period was 05/16/2000 (approximate).          Sexually active: No.  The current method of family planning is post menopausal status.    Exercising: Yes.    walking. Smoker:  no  Health Maintenance: Pap:  05-29-14 Neg History of abnormal Pap:  no MMG:  01-20-17 [id number] Colonoscopy:  04-08-13 TZG:YFVC due 5 years BMD:  05-04-17 osteoporosis, previously on fosamax, now on prolia (with primary) TDaP:  02-08-11 Gardasil: no   reports that she has never smoked. She has never used smokeless tobacco. She reports that she does not drink alcohol or use drugs. Her Son lives in [place], 2 grandsons  (72 and 4).   Past Medical History:  Diagnosis Date  . Anemia   . Arthritis   . BRCA gene mutation negative 03/2016  . Breast cancer ([place])    left 2017  . Breast cancer in female [facility]) 12/2015   left breast cancer, Estrogen negative, progesterone slight positive  . Dry eye syndrome   . Early cataract   . Family history of breast cancer   . Family history of ovarian  cancer   . Family history of prostate cancer   . Gall stones   . GERD (gastroesophageal reflux disease)   . History of hiatal hernia   . HSV infection    pos I and II  . Kidney stones   . Macular degeneration of both eyes   . Osteopenia   . Osteoporosis 1998   Off Fosomax 2008, Prolia 03/17/11, 09/14/11, 03/16/12  . Personal history of radiation therapy   . Thyroid disease age 22   hypo  . Wears glasses   H/O stillborn.   Past Surgical History:  Procedure Laterality Date  . AUGMENTATION MAMMAPLASTY  1981  . BREAST IMPLANT REMOVAL  1982  . BREAST LUMPECTOMY     left 2017  . BREAST LUMPECTOMY WITH RADIOACTIVE SEED AND SENTINEL LYMPH NODE BIOPSY Left 02/11/2016   Procedure: LEFT BREAST LUMPECTOMY WITH RADIOACTIVE SEED AND LEFT SENTINEL LYMPH NODE BIOPSY;  Surgeon: [name], MD;  Location: [place];  Service: General;  Laterality: Left;  . COLONOSCOPY  10/04   Dr Watt Climes  . COLONOSCOPY  04/07/08   normal - Dr. Watt Climes  . ELBOW SURGERY Left 12/1997  . ESOPHAGOGASTRODUODENOSCOPY ENDOSCOPY  08/2012   [place] hernia with GERD  . FOOT NEUROMA SURGERY Left 2004?  . TONSILLECTOMY AND ADENOIDECTOMY    . TUBAL LIGATION  1981    Current Outpatient Medications  Medication Sig Dispense Refill  . Bioflavonoid Products (C COMPLEX PO) Take by mouth 1 day or 1 dose.    . Carboxymethylcellulose Sod PF (RETAINE CMC) 0.5 % SOLN Apply 1 drop to eye 4 (four) times daily as needed.    . Cholecalciferol (VITAMIN D3) 5000 units CAPS Take 1 capsule by mouth daily.    Marland Kitchen denosumab (PROLIA) 60 MG/ML SOLN injection Inject 60 mg into the skin every 6 (six) months. Administer in upper arm, thigh, or abdomen    . Ferrous Sulfate (IRON) 28 MG TABS Take 28 mg by mouth daily.    . Multiple Vitamins-Minerals (ICAPS AREDS 2) CAPS Take by mouth 2 (two) times daily.    . ranitidine (ZANTAC) 150 MG capsule Take 150 mg by mouth 2 (two) times daily.     . RESTASIS 0.05 % ophthalmic emulsion Place 1 drop into both eyes 2 (two)  times daily.     Marland Kitchen SYNTHROID 100 MCG tablet Take 1 tablet by mouth daily.    . tamoxifen (NOLVADEX) 20 MG tablet Take 1 tablet (20 mg total) by mouth daily. 90 tablet 3  . valACYclovir (VALTREX) 500 MG tablet Take one tablet a day, can increase 1 tablet BID for 3 days if she has an outbreak. 180 tablet 3   No current facility-administered medications for this visit.     Family History  Problem Relation Age of Onset  . Heart failure Mother   . Prostate cancer Father 28  . Liver cancer Father   . Colon polyps Sister   . Breast cancer Sister 85  . Breast cancer Sister 56  . Ovarian cancer Cousin        died at 77  . Breast cancer Sister 39       recurrance at 75  . Breast cancer Sister 44  . Colon polyps Sister   . Breast cancer Other        X 4 + age 23, 12 with recurrence, 44, 35  . Lung cancer Brother        smoked  . Breast cancer Paternal Aunt   . Prostate cancer Brother 74  . Melanoma Brother   . Colon cancer Other 30  She has seen a Dietitian, negative genetic testing  Review of Systems  Constitutional: Negative.   HENT: Negative.   Eyes: Negative.   Respiratory: Negative.   Cardiovascular: Negative.   Gastrointestinal: Negative.   Endocrine: Negative.   Genitourinary: Positive for frequency.  Musculoskeletal: Negative.   Skin: Negative.   Allergic/Immunologic: Negative.   Neurological: Negative.   Hematological: Bruises/bleeds easily.  Psychiatric/Behavioral: Negative.     Exam:   BP 110/66 (BP Location: Right Arm, Patient Position: Sitting, Cuff Size: Normal)   Pulse 70   Resp 14   Wt 121 lb (54.9 kg)   LMP 05/16/2000 (Approximate)   BMI 22.13 kg/m   Weight change: _0 @ Height:      Ht Readings from Last 3 Encounters:  01/31/17 _1  (1.575 m)  11/22/16 _2  (1.575 m)  09/16/16 _3  (1.575 m)    General appearance: alert, cooperative and appears stated age Head: Normocephalic, without obvious abnormality, atraumatic Neck: no  adenopathy, supple, symmetrical, trachea midline and thyroid normal to inspection and palpation Lungs: clear to auscultation bilaterally Cardiovascular: regular rate and rhythm Breasts: evidence of left lumpectomy. The right nipple is retracted, increase in nodularity in the right breast at 5-6 o'clock. The nodularity is a few cm from the areolar region.  Abdomen: soft, non-tender; non distended,  no masses,  no organomegaly Extremities: extremities normal, atraumatic, no cyanosis or edema Skin: Skin color, texture, turgor normal. No rashes or lesions Lymph nodes: Cervical, supraclavicular, and axillary nodes normal. No abnormal inguinal nodes palpated Neurologic: Grossly normal   Pelvic: External genitalia:  no lesions              Urethra:  normal appearing urethra with no masses, tenderness or lesions              Bartholins and Skenes: normal                 Vagina: atrophic appearing vagina with normal color mild cystocele. Uterine prolapse not demonstrated today (but only examined supine)              Cervix: no lesions               Bimanual Exam:  Uterus:  normal size, contour, position, consistency, mobility, non-tender              Adnexa: no mass, fullness, tenderness               Rectovaginal: Confirms               Anus:  normal sphincter tone, no lesions  Chaperone was present for exam.  A:  Well Woman with normal exam  Mild genital prolapse, tolerable  H/O left breast cancer, on tamoxifen  Right breast with increased nodularity at 5 o'clock, inverted nipple (states inverted for a few years). I reviewed the chart and do not find another similar breast exam.   H/O HSV, on suppression    P:   No pap this year, we discussed the option of screening or no screening, reviewed recommendations  Labs with her primary  Diagnostic imaging right breast. Screening imaging due in the fall.  Colonoscopy in 11/19  DEXA in 12/20 (primary)  Discussed breast self exam  Discussed  calcium and vit D intake  See's dermatology yearly

## 2017-09-04 ENCOUNTER — Ambulatory Visit
Admission: RE | Admit: 2017-09-04 | Discharge: 2017-09-04 | Disposition: A | Discharge status: Home / Self Care | Payer: Medicare Other | Source: Ambulatory Visit | Attending: Obstetrics and Gynecology | Admitting: Obstetrics and Gynecology

## 2017-09-04 DIAGNOSIS — N6459 Other signs and symptoms in breast: Secondary | ICD-10-CM

## 2017-09-04 DIAGNOSIS — N63 Unspecified lump in unspecified breast: Secondary | ICD-10-CM

## 2017-09-04 DIAGNOSIS — Z853 Personal history of malignant neoplasm of breast: Secondary | ICD-10-CM

## 2017-09-11 ENCOUNTER — Telehealth: Payer: Self-pay | Admitting: *Deleted

## 2017-09-11 DIAGNOSIS — Z803 Family history of malignant neoplasm of breast: Secondary | ICD-10-CM

## 2017-09-11 DIAGNOSIS — C50412 Malignant neoplasm of upper-outer quadrant of left female breast: Secondary | ICD-10-CM

## 2017-09-11 DIAGNOSIS — N649 Disorder of breast, unspecified: Secondary | ICD-10-CM

## 2017-09-11 DIAGNOSIS — Z853 Personal history of malignant neoplasm of breast: Secondary | ICD-10-CM

## 2017-09-11 DIAGNOSIS — Z171 Estrogen receptor negative status [ER-]: Secondary | ICD-10-CM

## 2017-09-11 NOTE — Telephone Encounter (Signed)
This RN returned call to pt to discuss her VM question regarding " need Dr Magrinat's opinion about having an MRI "  Note pt has ongoing inversion of her right nipple- she had her mammogram with [place] with noted dense breast - MRI was suggested.  Pt's questions answered as well as informing pt per MD review - recommendation is to proceed with an MRI.  Paula Dawson is in agreement with the above.  Order will be placed.  Of note if a biopsy is requested- pt would like for Dr Wanita Chamberlain to perform.

## 2017-09-11 NOTE — Telephone Encounter (Signed)
Call received from [name] "I need to speak with Dr. Virgie Dad secretary, assistant, nurse practitioner, nurse or someone to get a message to him".  Call transferred to collaborative to help with this matter.

## 2017-09-14 ENCOUNTER — Other Ambulatory Visit: Payer: Self-pay | Admitting: *Deleted

## 2017-09-21 ENCOUNTER — Ambulatory Visit
Admission: RE | Admit: 2017-09-21 | Discharge: 2017-09-21 | Disposition: A | Discharge status: Home / Self Care | Payer: Medicare Other | Source: Ambulatory Visit | Attending: Oncology | Admitting: Oncology

## 2017-09-21 DIAGNOSIS — Z803 Family history of malignant neoplasm of breast: Secondary | ICD-10-CM

## 2017-09-21 DIAGNOSIS — Z853 Personal history of malignant neoplasm of breast: Secondary | ICD-10-CM

## 2017-09-21 DIAGNOSIS — Z171 Estrogen receptor negative status [ER-]: Secondary | ICD-10-CM

## 2017-09-21 DIAGNOSIS — N649 Disorder of breast, unspecified: Secondary | ICD-10-CM

## 2017-09-21 DIAGNOSIS — C50412 Malignant neoplasm of upper-outer quadrant of left female breast: Secondary | ICD-10-CM

## 2017-09-21 MED ORDER — GADOBENATE DIMEGLUMINE 529 MG/ML IV SOLN
11.0000 mL | Freq: Once | INTRAVENOUS | Status: AC | PRN
  Administered 2017-09-21: 11 mL via INTRAVENOUS

## 2017-09-26 ENCOUNTER — Telehealth: Payer: Self-pay | Admitting: *Deleted

## 2017-09-26 NOTE — Telephone Encounter (Addendum)
This RN spoke with pt per her call stating she had MRI of her breast " but the breast center said the results were sent to Paula Dawson ".  This RN reviewed results with pt - including recommendation for pt to see her surgeon for a definitive biopsy.  Discussed area of abnormality is adjacent to the inverted area - and biopsy would need to be done by the surgeon not by the breast center.  Paula Dawson states she sees Paula Dawson.  Plan is for this RN to contact Paula Dawson office and obtain an appointment asap per above and due to pt's concerns.  This note will be sent to Paula Dawson and to Paula Dawson for communication of pt's call.  This RN contacted Paula Dawson - discussed with Paula Dawson in Triage - appointment arranged for this Friday at 10 am for 1015 appointment.  Patient contacted and informed of above.

## 2017-09-27 ENCOUNTER — Other Ambulatory Visit: Payer: Medicare Other

## 2017-09-28 ENCOUNTER — Other Ambulatory Visit: Payer: Self-pay | Admitting: Oncology

## 2017-09-29 ENCOUNTER — Other Ambulatory Visit: Payer: Self-pay | Admitting: General Surgery

## 2017-10-02 ENCOUNTER — Other Ambulatory Visit: Payer: Self-pay | Admitting: Oncology

## 2017-10-06 ENCOUNTER — Other Ambulatory Visit: Payer: Self-pay | Admitting: Oncology

## 2017-10-12 ENCOUNTER — Encounter (HOSPITAL_BASED_OUTPATIENT_CLINIC_OR_DEPARTMENT_OTHER): Payer: Self-pay | Admitting: *Deleted

## 2017-10-13 ENCOUNTER — Encounter: Payer: Self-pay | Admitting: Oncology

## 2017-10-13 ENCOUNTER — Other Ambulatory Visit: Payer: Self-pay | Admitting: Oncology

## 2017-10-13 NOTE — H&P (Signed)
Paula Dawson Documented: 09/29/2017 10:09 AM Location: [facility] Patient #: [id number] DOB: 29-Jun-1945 Divorced / Language: Paula Dawson / Race: White Female   History of Present Illness Paula Klein MD; 09/29/2017 11:11 AM) The patient is a 72 year old female who presents for a follow-up for Breast cancer. Pt is a 72 yo F who presented with a screening detected left mass in September 2017. She underwent diagnostic imaging which showed 1.4 cm left breast mass at 2 o'clock. 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 This was ER - and weakly PR +. (probably ER/PR negative.) She has four sisters who have had breast cancer, with the youngest at age 55. She had a niece wtih breast cancer at age 44. Her dad had prostate cancer and her brother had lung cancer. She had menarche at age 51. She had 2 children, with the first at age 51. she used hormonal contraception for 12 years. She used HRT for 3-5 years.  Patient underwent left needle localized lumpectomy and sentinel lymph node biopsy on 02/11/16. There previously had been suspicion for microinvasion. 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  She was started on tamoxifen and received adjuvant XRT. She continues to be tolerating this well. She has some joint pain, but does not consider this to be excessive. 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 This is mainly just a "small nuisance" in terms of the amount of discomfort she is having. She no longer has severe or sharp pains.   She had follow up CT for concern of left lung nodule. Follow up CT 12/2016 was improved.   She saw her gyn in March 2019 as well. She felt a right subareolar mass and the patient had worsening nipple inversion. Dx imaging and MRI was performed. There is concern for central thickening/enhancement at the very center of the nipple. She presents for biopsy. She  denies pain or discharge. The nipple inversion has changed a bit.   MRI 09/26/2017 CLINICAL DATA: Patient presents for high risk screening breast MRI due to strong family history with greater than 20% lifetime risk. History of malignant left lumpectomy 2017 post radiation. Patient currently complains of intermittent central right nipple inversion over the past several years becoming more constant recently. No nipple discharge or palpable abnormality. Recent negative diagnostic sonographic workup.  LABS: Creatinine was obtained on site at [facility] at [address]  Results: Creatinine 0.7 mg/dL. GFR 88.  EXAM: BILATERAL BREAST MRI WITH AND WITHOUT CONTRAST  TECHNIQUE: Multiplanar, multisequence MR images of both breasts were obtained prior to and following the intravenous administration of 11 ml of MultiHance.  THREE-DIMENSIONAL MR IMAGE RENDERING ON INDEPENDENT WORKSTATION:  Three-dimensional MR images were rendered by post-processing of the original MR data on an independent workstation. The three-dimensional MR images were interpreted, and findings are reported in the following complete MRI report for this study. Three dimensional images were evaluated at the independent DynaCad workstation  COMPARISON: Previous exam(s).  FINDINGS: Breast composition: b. Scattered fibroglandular tissue.  Background parenchymal enhancement: Mild.  Right breast: Exam demonstrates evidence of patient's known mild central right nipple inversion. There is moderate superficial focal enhancement over the centrally inverted nipple involving the skin which is intermediate T1 high T2 signal. There are no abnormal masses or additional sites of abnormal enhancement.  Left breast: No mass or abnormal enhancement. Post lumpectomy changes over the axillary tail.  Lymph nodes: No abnormal appearing  lymph nodes.  Ancillary findings: None.  IMPRESSION: 1. Mild central  right nipple inversion with moderate focal indeterminant enhancement superficially involving the central region of this nipple inversion at the skin surface.  2. Post lumpectomy changes left upper outer quadrant.  RECOMMENDATION: Recommend surgical consultation for possible skin punch biopsy of this indeterminant enhancement centrally within the inverted right nipple.  BI-RADS CATEGORY 4: Suspicious.  right dx mammogram/us 09/04/2017 ACR Breast Density Category c: The breast tissue is heterogeneously dense, which may obscure small masses.  FINDINGS: No mass, architectural distortion, or suspicious microcalcification is identified in the right breast to suggest malignancy.  Mammographic images were processed with CAD.  On physical exam, there is central nipple inversion on the right. I do not palpate a mass in the right breast. The skin of the right breast appears normal.  Targeted ultrasound is performed, showing normal fibroglandular tissue in the right breast. No solid or cystic mass or abnormal shadowing is identified to suggest malignancy.  IMPRESSION: No mammographic or sonographic evidence of malignancy in the right breast. The patient describes progressive central right nipple inversion and central right nipple inversion is seen on physical exam today.  bilateral dx Mammogram 01/2017 ACR Breast Density Category c: The breast tissue is heterogeneously dense, which may obscure small masses.  FINDINGS: There are lumpectomy changes in the deep superior left breast. No mass, nonsurgical distortion, or suspicious microcalcification is identified in either breast to suggest malignancy.  Mammographic images were processed with CAD.  IMPRESSION: Lumpectomy changes in the deep superior left breast. No evidence of malignancy.  RECOMMENDATION: Diagnostic mammogram is suggested in 1 year. (Code:[id number])  I have discussed the findings and recommendations with the  patient. Results were also provided in writing at the conclusion of the visit. If applicable, a reminder letter will be sent to the patient regarding the next appointment.  BI-RADS CATEGORY 2: Benign.   Allergies (Paula Dawson, [place]; 09/29/2017 10:09 AM) Vicodin *ANALGESICS - OPIOID*  Abdominal pain. Hydrocodone-Acetaminophen *ANALGESICS - OPIOID*  Allergies Reconciled   Medication History (Paula Dawson, [place]; 09/29/2017 10:09 AM) Tamoxifen Citrate (20MG Tablet, Oral) Active. Vitamin D (Ergocalciferol) (50000UNIT Capsule, Oral) Active. Retaine CMC (0.5% Solution, Ophthalmic) Active. Vitamin C (500MG Tablet, Oral) Active. Restasis (0.05% Emulsion, Ophthalmic) Active. Prolia (60MG/ML Solution, Subcutaneous) Active. Vitamin D2 (Oral) Specific strength unknown - Active. Estradiol (10MCG Tablet, Vaginal) Active. Iron (325 (65 Fe)MG Tablet, Oral) Active. Levothyroxine Sodium (88MCG Tablet, Oral) Active. Multivitamin Adult (Oral) Active. RaNITidine HCl (150MG Capsule, Oral) Active. ValACYclovir HCl (1GM Tablet, Oral) Active. Medications Reconciled    Review of Systems Paula Klein MD; 09/29/2017 11:11 AM) All other systems negative  Vitals (Paula A. Brown RMA; 09/29/2017 10:09 AM) 09/29/2017 10:09 AM Weight: 122 lb Height: 62in Body Surface Area: 1.55 m Body Mass Index: 22.31 kg/m  Temp.: 98.82F  Pulse: 81 (Regular)  BP: 112/64 (Sitting, Left Arm, Standard)       Physical Exam Paula Klein MD; 09/29/2017 11:12 AM) General Mental Status-Alert. General Appearance-Consistent with stated age. Hydration-Well hydrated. Voice-Normal.  Head and Neck Head-normocephalic, atraumatic with no lesions or palpable masses.  Eye Sclera/Conjunctiva - Bilateral-No scleral icterus.  Chest and Lung Exam Chest and lung exam reveals -quiet, even and easy respiratory effort with no use of accessory muscles. Inspection Chest Wall -  Normal. Back - normal.  Breast Note: right breast with central nipple inversion. The center of the nipple is tethered and firm. The nipple is not as defined from the areola as many nipples  are. the right nipple is firmer than the left.   Cardiovascular Cardiovascular examination reveals -normal pedal pulses bilaterally. Note: regular rate and rhythm  Abdomen Inspection-Inspection Normal. Palpation/Percussion Palpation and Percussion of the abdomen reveal - Soft, Non Tender, No Rebound tenderness, No Rigidity (guarding) and No hepatosplenomegaly.  Peripheral Vascular Upper Extremity Inspection - Bilateral - Normal - No Clubbing, No Cyanosis, No Edema, Pulses Intact. Lower Extremity Palpation - Edema - Bilateral - No edema.  Neurologic Neurologic evaluation reveals -alert and oriented x 3 with no impairment of recent or remote memory. Mental Status-Normal.  Musculoskeletal Global Assessment -Note: no gross deformities.  Normal Exam - Left-Upper Extremity Strength Normal and Lower Extremity Strength Normal. Normal Exam - Right-Upper Extremity Strength Normal and Lower Extremity Strength Normal.  Lymphatic Head & Neck  General Head & Neck Lymphatics: Bilateral - Description - Normal. Axillary  General Axillary Region: Bilateral - Description - Normal. Tenderness - Non Tender.    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 The abnormal area is at the very center of the nipple. The patient is quite concerned about the discomfort of numbing the nipple. I discussed the pros and cons of punch biopsy.  Based on the discussion, we will plan excisional biopsy of the nipple in the OR with MAC anesthesia. I would plan to leave the areola, but I would take the entire central nipple with the posterior tissue.  I discussed risk of bleeding/infection. I reviewed that she would  need care for post op and overnight. She will make arrangements. ABNORMAL MRI, BREAST (R92.8) Impression: See above. Current Plans Schedule for Surgery Pt Education - [facility] Breast Biopsy HCI: discussed with patient and provided information. HISTORY OF LEFT BREAST CANCER (Z85.3) Impression: NED on left. continue tamoxifen FAMILY HISTORY OF BREAST CANCER (Z80.3) Impression: MRI o/w negative.    Signed by Paula Klein, MD (09/29/2017 11:15 AM)

## 2017-10-13 NOTE — Progress Notes (Unsigned)
Paula Dawson  [phone] [phone]     ID: Paula Dawson DOB: 11/17/1945  MR#: [id number]  KGU#:[id number]  Patient Care Team: Harlan Stains, MD as PCP - General (Family Medicine) [name], MD as Consulting Physician (General Surgery) Kennett Symes, Virgie Dad, MD as Consulting Physician (Oncology) Kyung Rudd, MD as Consulting Physician (Radiation Oncology) Kem Boroughs, FNP as Nurse Practitioner (Family Medicine) OTHER MD:  CHIEF COMPLAINT: Ductal carcinoma in situ  CURRENT TREATMENT: Tamoxifen   BREAST CANCER HISTORY: From the original intake note:  Paula Dawson had bilateral screening mammography at the [facility] 01/06/2016, showing a possible mass in the left breast. On 01/13/2016 she had left diagnostic mammography with tomography and left breast ultrasonography. The breast density was category C. In the upper outer quadrant of the left breast there was an area of asymmetry measuring 0.8 cm. On physical exam there was no palpable mass. Ultrasonography confirmed an irregular hypoechoic mass at the 2:00 position 6 cm from the nipple measuring 1.4 cm. Ultrasound of the left axilla was benign.  On 01/15/2016 the patient underwent biopsy of the left breast mass in question, and this showed ([id number]) ductal carcinoma in situ, grade 3, estrogen receptor negative, progesterone receptor questionably positive (20% with weak staining intensity).  Her subsequent history is as detailed below  INTERVAL HISTORY: Paula Dawson returns today for follow-up of her ductal carcinoma in situ. She is on tamoxifen, with intermittent hot flashes. She notes that she is doing well overall. Pt reports that she helps with her grandchildren, ages 73 and 68. She notes that she has been visiting her sister in [facility] who has Alzheimer's. Pt was evaluated by her PCP for her cough that is intermittently productive x 4 months and her PCP contributed her symptoms to the pt hx  of GERD. Pt was placed on prescription omeprazole to alleviate her symptoms and has been taking it for 3 weeks. She denies PMHx of asthma or HTN.  In February of this year she was evaluated by Dr. Barry Dienes who felt there was a little bit of lymphedema involving the left breast. The patient was referred for manual drainage and is now wearing sports brass as much as tolerated.  REVIEW OF SYSTEMS: Paula Dawson reports cough that is intermittently prodocutive x 4 months. Pt denies post-nasal drip. Pt notes that she ambulates indoor for her exercise. She denies unusual headaches, visual changes, nausea, vomiting, or dizziness. There has been no unusual cough, phlegm production, or pleurisy. This been no change in bowel or bladder habits. She denies unexplained fatigue or unexplained weight loss, bleeding, rash, or fever. A detailed review of systems was otherwise negative.    PAST MEDICAL HISTORY: Past Medical History:  Diagnosis Date  . Anemia   . Arthritis   . BRCA gene mutation negative 03/2016  . Breast cancer ([place])    left 2017  . Breast cancer in female [facility]) 12/2015   left breast cancer, Estrogen negative, progesterone slight positive  . Dry eye syndrome   . Early cataract   . Family history of breast cancer   . Family history of ovarian cancer   . Family history of prostate cancer   . Gall stones   . GERD (gastroesophageal reflux disease)   . History of hiatal hernia   . HSV infection    pos I and II  . Kidney stones   . Macular degeneration of both eyes   . Osteopenia   . Osteoporosis 1998   Off Fosomax  2008, Prolia 03/17/11, 09/14/11, 03/16/12  . Personal history of radiation therapy   . Thyroid disease age 37   hypo  . Wears glasses     PAST SURGICAL HISTORY: Past Surgical History:  Procedure Laterality Date  . AUGMENTATION MAMMAPLASTY  1981  . BREAST IMPLANT REMOVAL  1982  . BREAST LUMPECTOMY     left 2017  . BREAST LUMPECTOMY WITH RADIOACTIVE SEED AND SENTINEL LYMPH NODE  BIOPSY Left 02/11/2016   Procedure: LEFT BREAST LUMPECTOMY WITH RADIOACTIVE SEED AND LEFT SENTINEL LYMPH NODE BIOPSY;  Surgeon: [name], MD;  Location: [place];  Service: General;  Laterality: Left;  . COLONOSCOPY  10/04   Dr Watt Climes  . COLONOSCOPY  04/07/08   normal - Dr. Watt Climes  . ELBOW SURGERY Left 12/1997  . ESOPHAGOGASTRODUODENOSCOPY ENDOSCOPY  08/2012   [place] hernia with GERD  . FOOT NEUROMA SURGERY Left 2004?  . TONSILLECTOMY AND ADENOIDECTOMY    . TUBAL LIGATION  1981    FAMILY HISTORY Family History  Problem Relation Age of Onset  . Heart failure Mother   . Prostate cancer Father 28  . Liver cancer Father   . Colon polyps Sister   . Breast cancer Sister 3  . Breast cancer Sister 84  . Ovarian cancer Cousin        died at 51  . Breast cancer Sister 75       recurrance at 37  . Breast cancer Sister 30  . Colon polyps Sister   . Breast cancer Other        X 4 + age 39, 18 with recurrence, 26, 81  . Lung cancer Brother        smoked  . Breast cancer Paternal Aunt   . Prostate cancer Brother 75  . Melanoma Brother   . Colon cancer Other 84  The patient's father died at age 60. He had been diagnosed with prostate "and liver" cancer at the age of 79. The patient's mother died at age 38. The patient had 4 brothers, 5 sisters. One brother was diagnosed with lung cancer at age 85. She has 4 sisters with breast cancer, one initially diagnosed at age 83, recurring at age 84.  GYNECOLOGIC HISTORY:  Patient's last menstrual period was 05/16/2000 (approximate). Menarche age 71, first live birth age 50, the patient is GX P1. She stopped having periods approximately 2002. She used hormone replacement for approximately 5 years. 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 She is divorced and lives alone, with 1. Her son Aeriana Speece lives in [place]. His  wife is a Cabin crew and he is her handyman. The patient has 2 grandsons. She attends a [employer] in the [place] area    ADVANCED DIRECTIVES: In place; the patient has named her son [name] as her healthcare part of attorney. He can be reached at [phone].   HEALTH MAINTENANCE: Social History   Tobacco Use  . Smoking status: Never Smoker  . Smokeless tobacco: Never Used  Substance Use Topics  . Alcohol use: No  . Drug use: No     Colonoscopy: 2014/May got  PAP:  Bone density: December 2016, T score -2.9   Allergies  Allergen Reactions  . Vicodin [Hydrocodone-Acetaminophen] Other (See Comments)    Pt prefers not to take because it knocks her out    Current Outpatient Medications  Medication Sig Dispense Refill  . Bioflavonoid Products (C COMPLEX PO) Take by mouth 1 day or 1 dose.    . Carboxymethylcellulose Sod PF (RETAINE CMC) 0.5 % SOLN Apply 1 drop to eye 4 (four) times daily as needed.    . Cholecalciferol (VITAMIN D3) 5000 units CAPS Take 1 capsule by mouth daily.    Marland Kitchen denosumab (PROLIA) 60 MG/ML SOLN injection Inject 60 mg into the skin every 6 (six) months. Administer in upper arm, thigh, or abdomen    . Ferrous Sulfate (IRON) 28 MG TABS Take 28 mg by mouth daily.    . Multiple Vitamins-Minerals (ICAPS AREDS 2) CAPS Take by mouth 2 (two) times daily.    . ranitidine (ZANTAC) 150 MG capsule Take 150 mg by mouth 2 (two) times daily.     . RESTASIS 0.05 % ophthalmic emulsion Place 1 drop into both eyes 2 (two) times daily.     Marland Kitchen SYNTHROID 100 MCG tablet Take 1 tablet by mouth daily.    . tamoxifen (NOLVADEX) 20 MG tablet Take 1 tablet (20 mg total) by mouth daily. 90 tablet 3  . valACYclovir (VALTREX) 500 MG tablet Take one tablet a day, can increase 1 tablet BID for 3 days if she has an outbreak. 180 tablet 3   No current facility-administered medications for this visit.     OBJECTIVE: Middle-aged white woman Who appears well  There were no vitals  filed for this visit.   There is no height or weight on file to calculate BMI.    ECOG FS:0 - Asymptomatic  Sclerae unicteric, EOMs intact Oropharynx clear and moist No cervical or supraclavicular adenopathy Lungs no rales or rhonchi Heart regular rate and rhythm Abd soft, nontender, positive bowel sounds MSK no focal spinal tenderness, no upper extremity lymphedema Neuro: nonfocal, well oriented, appropriate affect Breasts: The right breast is benign. The left breast is status post lumpectomy followed by radiation with no evidence of local recurrence. Both axillae are benign.   LAB RESULTS:  CMP     Component Value Date/Time   NA 141 02/09/2016 1328   NA 142 01/27/2016 1223   K 3.7 02/09/2016 1328   K 3.6 01/27/2016 1223   CL 108 02/09/2016 1328   CO2 25 02/09/2016 1328   CO2 27 01/27/2016 1223   GLUCOSE 110 (H) 02/09/2016 1328   GLUCOSE 131 01/27/2016 1223   BUN 14 02/09/2016 1328   BUN 14.8 01/27/2016 1223   CREATININE 0.73 02/09/2016 1328   CREATININE 0.8 01/27/2016 1223   CALCIUM 9.1 02/09/2016 1328   CALCIUM 9.1 01/27/2016 1223   PROT 6.6 01/27/2016 1223   ALBUMIN 3.6 01/27/2016 1223   AST 21 01/27/2016 1223   ALT 21 01/27/2016 1223   ALKPHOS 93 01/27/2016 1223   BILITOT 0.36 01/27/2016 1223   GFRNONAA >60 02/09/2016 1328   GFRAA >60 02/09/2016 1328    INo results found for: SPEP, UPEP  Lab Results  Component Value Date   WBC 10.9 (H) 02/09/2016   NEUTROABS 7.0 (H) 01/27/2016   HGB 14.9 02/09/2016   HCT 44.7 02/09/2016   MCV 96.1 02/09/2016   PLT 255 02/09/2016      Chemistry      Component Value Date/Time   NA 141 02/09/2016 1328   NA 142 01/27/2016 1223   K 3.7 02/09/2016 1328   K 3.6 01/27/2016 1223   CL 108 02/09/2016 1328   CO2 25 02/09/2016 1328   CO2 27 01/27/2016 1223   BUN  14 02/09/2016 1328   BUN 14.8 01/27/2016 1223   CREATININE 0.73 02/09/2016 1328   CREATININE 0.8 01/27/2016 1223      Component Value Date/Time   CALCIUM 9.1  02/09/2016 1328   CALCIUM 9.1 01/27/2016 1223   ALKPHOS 93 01/27/2016 1223   AST 21 01/27/2016 1223   ALT 21 01/27/2016 1223   BILITOT 0.36 01/27/2016 1223       No results found for: LABCA2  No components found for: LABCA125  No results for input(s): INR in the last 168 hours.  Urinalysis    Component Value Date/Time   BILIRUBINUR n 07/18/2017 1017   PROTEINUR n 07/18/2017 1017   UROBILINOGEN 0.2 07/18/2017 1017   NITRITE n 07/18/2017 1017   LEUKOCYTESUR Moderate (2+) (A) 07/18/2017 1017     STUDIES: Mr Breast Bilateral W Wo Contrast Inc Cad  Result Date: 09/22/2017 CLINICAL DATA:  Patient presents for high risk screening breast MRI due to strong family history with greater than 20% lifetime risk. History of malignant left lumpectomy 2017 post radiation. Patient currently complains of intermittent central right nipple inversion over the past several years becoming more constant recently. No nipple discharge or palpable abnormality. Recent negative diagnostic sonographic workup. LABS:  Creatinine was obtained on site at [place] at [address] Results: Creatinine 0.7 mg/dL.  GFR 88. EXAM: BILATERAL BREAST MRI WITH AND WITHOUT CONTRAST TECHNIQUE: Multiplanar, multisequence MR images of both breasts were obtained prior to and following the intravenous administration of 11 ml of MultiHance. THREE-DIMENSIONAL MR IMAGE RENDERING ON INDEPENDENT WORKSTATION: Three-dimensional MR images were rendered by post-processing of the original MR data on an independent workstation. The three-dimensional MR images were interpreted, and findings are reported in the following complete MRI report for this study. Three dimensional images were evaluated at the independent DynaCad workstation COMPARISON:  Previous exam(s). FINDINGS: Breast composition: b. Scattered fibroglandular tissue. Background parenchymal enhancement: Mild. Right breast: Exam demonstrates evidence of patient's known  mild central right nipple inversion. There is moderate superficial focal enhancement over the centrally inverted nipple involving the skin which is intermediate T1 high T2 signal. There are no abnormal masses or additional sites of abnormal enhancement. Left breast: No mass or abnormal enhancement. Post lumpectomy changes over the axillary tail. Lymph nodes: No abnormal appearing lymph nodes. Ancillary findings:  None. IMPRESSION: 1. Mild central right nipple inversion with moderate focal indeterminant enhancement superficially involving the central region of this nipple inversion at the skin surface. 2.  Post lumpectomy changes left upper outer quadrant. RECOMMENDATION: Recommend surgical consultation for possible skin punch biopsy of this indeterminant enhancement centrally within the inverted right nipple. BI-RADS CATEGORY  4: Suspicious. Electronically Signed   By: Marin Olp M.D.   On: 09/22/2017 11:35   On 12/23/2016 she had a CT of the chest without contrast compared to a year prior for follow-up of pulmonary nodules, with no CT findings suspicious for metastatic disease and no new or acute findings. There were stable radiation changes involving the left anterior lung ELIGIBLE FOR AVAILABLE RESEARCH PROTOCOL: no  ASSESSMENT: 72 y.o. [place] woman status post left breast upper outer quadrant biopsy 01/15/2016 for ductal carcinoma in situ, grade 3, estrogen receptor negative, progesterone receptor questionably positive  (1) Status post left lumpectomy and sentinel lymph node sampling 02/11/2016 for a pTis pN0, stage 0 ductal carcinoma in situ, grade 3, with negative margins.  (2) adjuvant radiation 03/10/2016 through 04/06/2016 Site/dose:   The patient initially received a dose of 42.5 Gy  in 17 fractions to the breast using whole-breast tangent fields. This was delivered using a 3-D conformal technique. The patient then received a boost to the seroma. This delivered an additional 7.5 Gy in 3  fractions using a 3 field photon technique due to the depth of the seroma. The total dose was 50 Gy.  (3) consider genetics 03/21/2016 through the Breast/Ovarian cancer gene panel.offered by [facility] found no deleterious mutations in  ATM, BARD1, BRCA1, BRCA2, BRIP1, CDH1, CHEK2, EPCAM, FANCC, MLH1, MSH2, MSH6, NBN, PALB2, PMS2, PTEN, RAD51C, RAD51D, TP53, and XRCC2  (4) tamoxifen started December 2017  PLAN: Paula Dawson is now one year out from definitive surgery for her breast cancer with no evidence of disease recurrence. This is favorable.  She is tolerating tamoxifen well. I don't think her cough is going to be related. Reflux is a much more likely cause. A second possibility is dry air: She walks indoors not outdoors and she doesn't cracked a window at night so she is basically breathing air-conditioning her in the summer and he did air in the winter, all of which is rather low moisture.  If her cough does not resolve after 4-6 weeks she will let us know. We can certainly go off tamoxifen for a couple months as a child but I don't think that is going to be helpful  We also went over her CT scan which was very favorable. I do not believe that needs to be repeated.  She will see me again in one year. She knows to call for any problems that may develop before that visit.      Mical Kicklighter, Virgie Dad, MD  10/13/17 6:03 PM Medical Oncology and Hematology [facility] [address] [place], [place] [address] Tel. [phone]    Fax. [phone]   This document serves as a record of services personally performed by Lurline Del, MD. It was created on her behalf by Steva Colder, a trained medical scribe. The creation of this record is based on the scribe's personal observations and the provider's statements to them. This document has been checked and approved by the attending provider.

## 2017-10-16 NOTE — Progress Notes (Signed)
Ensure pre surgery drink given with instructions to complete by 11am, dos, pt verbalized understanding. 

## 2017-10-19 ENCOUNTER — Encounter (HOSPITAL_BASED_OUTPATIENT_CLINIC_OR_DEPARTMENT_OTHER): Payer: Self-pay | Admitting: *Deleted

## 2017-10-19 ENCOUNTER — Other Ambulatory Visit: Payer: Self-pay

## 2017-10-19 ENCOUNTER — Encounter (HOSPITAL_BASED_OUTPATIENT_CLINIC_OR_DEPARTMENT_OTHER)
Admission: RE | Disposition: A | Discharge status: Home / Self Care | Payer: Self-pay | Source: Ambulatory Visit | Attending: General Surgery

## 2017-10-19 ENCOUNTER — Ambulatory Visit (HOSPITAL_BASED_OUTPATIENT_CLINIC_OR_DEPARTMENT_OTHER)
Admission: RE | Admit: 2017-10-19 | Discharge: 2017-10-19 | Disposition: A | Discharge status: Home / Self Care | Payer: Medicare Other | Source: Ambulatory Visit | Attending: General Surgery | Admitting: General Surgery

## 2017-10-19 ENCOUNTER — Ambulatory Visit (HOSPITAL_BASED_OUTPATIENT_CLINIC_OR_DEPARTMENT_OTHER): Payer: Medicare Other | Admitting: Anesthesiology

## 2017-10-19 DIAGNOSIS — M8580 Other specified disorders of bone density and structure, unspecified site: Secondary | ICD-10-CM | POA: Diagnosis not present

## 2017-10-19 DIAGNOSIS — N6453 Retraction of nipple: Secondary | ICD-10-CM | POA: Insufficient documentation

## 2017-10-19 DIAGNOSIS — Z803 Family history of malignant neoplasm of breast: Secondary | ICD-10-CM | POA: Diagnosis not present

## 2017-10-19 DIAGNOSIS — Z79899 Other long term (current) drug therapy: Secondary | ICD-10-CM | POA: Insufficient documentation

## 2017-10-19 DIAGNOSIS — Z853 Personal history of malignant neoplasm of breast: Secondary | ICD-10-CM | POA: Insufficient documentation

## 2017-10-19 DIAGNOSIS — D649 Anemia, unspecified: Secondary | ICD-10-CM | POA: Diagnosis not present

## 2017-10-19 DIAGNOSIS — H3530 Unspecified macular degeneration: Secondary | ICD-10-CM | POA: Diagnosis not present

## 2017-10-19 DIAGNOSIS — Z87442 Personal history of urinary calculi: Secondary | ICD-10-CM | POA: Insufficient documentation

## 2017-10-19 DIAGNOSIS — Z885 Allergy status to narcotic agent status: Secondary | ICD-10-CM | POA: Insufficient documentation

## 2017-10-19 DIAGNOSIS — Z801 Family history of malignant neoplasm of trachea, bronchus and lung: Secondary | ICD-10-CM | POA: Insufficient documentation

## 2017-10-19 DIAGNOSIS — R928 Other abnormal and inconclusive findings on diagnostic imaging of breast: Secondary | ICD-10-CM | POA: Insufficient documentation

## 2017-10-19 DIAGNOSIS — Z8042 Family history of malignant neoplasm of prostate: Secondary | ICD-10-CM | POA: Insufficient documentation

## 2017-10-19 DIAGNOSIS — M1990 Unspecified osteoarthritis, unspecified site: Secondary | ICD-10-CM | POA: Diagnosis not present

## 2017-10-19 HISTORY — DX: Other specified postprocedural states: Z98.890

## 2017-10-19 HISTORY — DX: Other specified postprocedural states: R11.2

## 2017-10-19 HISTORY — DX: Other complications of anesthesia, initial encounter: T88.59XA

## 2017-10-19 HISTORY — PX: EXCISION OF BREAST BIOPSY: SHX5822

## 2017-10-19 HISTORY — DX: Adverse effect of unspecified anesthetic, initial encounter: T41.45XA

## 2017-10-19 SURGERY — EXCISION OF BREAST BIOPSY
Anesthesia: Regional | Site: Breast | Laterality: Right

## 2017-10-19 MED ORDER — CEFAZOLIN SODIUM-DEXTROSE 2-4 GM/100ML-% IV SOLN
2.0000 g | INTRAVENOUS | Status: AC
  Administered 2017-10-19: 2 g via INTRAVENOUS

## 2017-10-19 MED ORDER — MIDAZOLAM HCL 2 MG/2ML IJ SOLN: 1.0000 mg | INTRAMUSCULAR | Status: DC | PRN

## 2017-10-19 MED ORDER — CEFAZOLIN SODIUM-DEXTROSE 2-4 GM/100ML-% IV SOLN
INTRAVENOUS | Status: AC
  Filled 2017-10-19: qty 100

## 2017-10-19 MED ORDER — EPHEDRINE SULFATE 50 MG/ML IJ SOLN
INTRAMUSCULAR | Status: DC | PRN
  Administered 2017-10-19: 10 mg via INTRAVENOUS

## 2017-10-19 MED ORDER — ACETAMINOPHEN 500 MG PO TABS
ORAL_TABLET | ORAL | Status: AC
  Filled 2017-10-19: qty 2

## 2017-10-19 MED ORDER — PROPOFOL 10 MG/ML IV BOLUS
INTRAVENOUS | Status: DC | PRN
  Administered 2017-10-19: 150 mg via INTRAVENOUS

## 2017-10-19 MED ORDER — GABAPENTIN 300 MG PO CAPS
ORAL_CAPSULE | ORAL | Status: AC
  Filled 2017-10-19: qty 1

## 2017-10-19 MED ORDER — MEPERIDINE HCL 25 MG/ML IJ SOLN: 6.2500 mg | INTRAMUSCULAR | Status: DC | PRN

## 2017-10-19 MED ORDER — LIDOCAINE HCL (CARDIAC) PF 100 MG/5ML IV SOSY
PREFILLED_SYRINGE | INTRAVENOUS | Status: AC
  Filled 2017-10-19: qty 5

## 2017-10-19 MED ORDER — ONDANSETRON HCL 4 MG/2ML IJ SOLN
INTRAMUSCULAR | Status: AC
  Filled 2017-10-19: qty 2

## 2017-10-19 MED ORDER — FENTANYL CITRATE (PF) 100 MCG/2ML IJ SOLN
50.0000 ug | INTRAMUSCULAR | Status: DC | PRN
  Administered 2017-10-19: 100 ug via INTRAVENOUS

## 2017-10-19 MED ORDER — KETOROLAC TROMETHAMINE 30 MG/ML IJ SOLN
INTRAMUSCULAR | Status: AC
  Filled 2017-10-19: qty 1

## 2017-10-19 MED ORDER — LACTATED RINGERS IV SOLN
INTRAVENOUS | Status: DC
  Administered 2017-10-19: 14:00:00 via INTRAVENOUS

## 2017-10-19 MED ORDER — EPHEDRINE SULFATE 50 MG/ML IJ SOLN
INTRAMUSCULAR | Status: AC
  Filled 2017-10-19: qty 1

## 2017-10-19 MED ORDER — FENTANYL CITRATE (PF) 100 MCG/2ML IJ SOLN: 25.0000 ug | INTRAMUSCULAR | Status: DC | PRN

## 2017-10-19 MED ORDER — GABAPENTIN 300 MG PO CAPS
300.0000 mg | ORAL_CAPSULE | ORAL | Status: AC
  Administered 2017-10-19: 300 mg via ORAL

## 2017-10-19 MED ORDER — ONDANSETRON HCL 4 MG/2ML IJ SOLN
INTRAMUSCULAR | Status: DC | PRN
  Administered 2017-10-19: 4 mg via INTRAVENOUS

## 2017-10-19 MED ORDER — METOCLOPRAMIDE HCL 5 MG/ML IJ SOLN: 10.0000 mg | Freq: Once | INTRAMUSCULAR | Status: DC | PRN

## 2017-10-19 MED ORDER — LIDOCAINE HCL 1 % IJ SOLN
INTRAMUSCULAR | Status: DC | PRN
  Administered 2017-10-19: 20 mL via INTRAMUSCULAR

## 2017-10-19 MED ORDER — SCOPOLAMINE 1 MG/3DAYS TD PT72: 1.0000 | MEDICATED_PATCH | Freq: Once | TRANSDERMAL | Status: DC | PRN

## 2017-10-19 MED ORDER — ACETAMINOPHEN 500 MG PO TABS
1000.0000 mg | ORAL_TABLET | ORAL | Status: AC
  Administered 2017-10-19: 1000 mg via ORAL

## 2017-10-19 MED ORDER — DEXAMETHASONE SODIUM PHOSPHATE 4 MG/ML IJ SOLN
INTRAMUSCULAR | Status: DC | PRN
  Administered 2017-10-19: 10 mg via INTRAVENOUS

## 2017-10-19 MED ORDER — CHLORHEXIDINE GLUCONATE CLOTH 2 % EX PADS: 6.0000 | MEDICATED_PAD | Freq: Once | CUTANEOUS | Status: DC

## 2017-10-19 MED ORDER — DEXAMETHASONE SODIUM PHOSPHATE 10 MG/ML IJ SOLN
INTRAMUSCULAR | Status: AC
  Filled 2017-10-19: qty 1

## 2017-10-19 MED ORDER — LIDOCAINE 2% (20 MG/ML) 5 ML SYRINGE
INTRAMUSCULAR | Status: DC | PRN
  Administered 2017-10-19: 40 mg via INTRAVENOUS

## 2017-10-19 MED ORDER — LIDOCAINE HCL (PF) 1 % IJ SOLN
INTRAMUSCULAR | Status: AC
  Filled 2017-10-19: qty 30

## 2017-10-19 MED ORDER — OXYCODONE HCL 5 MG PO TABS: 5.0000 mg | ORAL_TABLET | Freq: Four times a day (QID) | ORAL | 0 refills | Status: DC | PRN

## 2017-10-19 MED ORDER — FENTANYL CITRATE (PF) 100 MCG/2ML IJ SOLN
INTRAMUSCULAR | Status: AC
  Filled 2017-10-19: qty 2

## 2017-10-19 SURGICAL SUPPLY — 54 items
ADH SKN CLS APL DERMABOND .7 (GAUZE/BANDAGES/DRESSINGS) ×1
BLADE SURG 10 STRL SS (BLADE) ×1 IMPLANT
BLADE SURG 15 STRL LF DISP TIS (BLADE) ×1 IMPLANT
BLADE SURG 15 STRL SS (BLADE) ×2
CANISTER SUCT 1200ML W/VALVE (MISCELLANEOUS) ×2 IMPLANT
CHLORAPREP W/TINT 26ML (MISCELLANEOUS) ×2 IMPLANT
CLIP VESOCCLUDE LG 6/CT (CLIP) IMPLANT
COVER BACK TABLE 60X90IN (DRAPES) ×2 IMPLANT
COVER MAYO STAND STRL (DRAPES) ×2 IMPLANT
DECANTER SPIKE VIAL GLASS SM (MISCELLANEOUS) IMPLANT
DERMABOND ADVANCED (GAUZE/BANDAGES/DRESSINGS) ×1
DERMABOND ADVANCED .7 DNX12 (GAUZE/BANDAGES/DRESSINGS) ×1 IMPLANT
DEVICE DUBIN W/COMP PLATE 8390 (MISCELLANEOUS) IMPLANT
DRAPE LAPAROTOMY 100X72 PEDS (DRAPES) ×2 IMPLANT
DRAPE UTILITY XL STRL (DRAPES) ×4 IMPLANT
DRSG PAD ABDOMINAL 8X10 ST (GAUZE/BANDAGES/DRESSINGS) IMPLANT
ELECT COATED BLADE 2.86 ST (ELECTRODE) ×2 IMPLANT
ELECT REM PT RETURN 9FT ADLT (ELECTROSURGICAL) ×2
ELECTRODE REM PT RTRN 9FT ADLT (ELECTROSURGICAL) ×1 IMPLANT
GAUZE SPONGE 4X4 12PLY STRL LF (GAUZE/BANDAGES/DRESSINGS) ×2 IMPLANT
GLOVE BIO SURGEON STRL SZ 6 (GLOVE) ×2 IMPLANT
GLOVE BIOGEL PI IND STRL 6.5 (GLOVE) ×1 IMPLANT
GLOVE BIOGEL PI IND STRL 7.0 (GLOVE) IMPLANT
GLOVE BIOGEL PI INDICATOR 6.5 (GLOVE) ×1
GLOVE BIOGEL PI INDICATOR 7.0 (GLOVE) ×2
GLOVE ECLIPSE 6.5 STRL STRAW (GLOVE) ×1 IMPLANT
GOWN STRL REUS W/ TWL LRG LVL3 (GOWN DISPOSABLE) ×1 IMPLANT
GOWN STRL REUS W/TWL 2XL LVL3 (GOWN DISPOSABLE) ×2 IMPLANT
GOWN STRL REUS W/TWL LRG LVL3 (GOWN DISPOSABLE) ×2
KIT MARKER MARGIN INK (KITS) ×1 IMPLANT
LIGHT WAVEGUIDE WIDE FLAT (MISCELLANEOUS) IMPLANT
NDL HYPO 25X1 1.5 SAFETY (NEEDLE) ×1 IMPLANT
NEEDLE HYPO 25X1 1.5 SAFETY (NEEDLE) ×2 IMPLANT
NS IRRIG 1000ML POUR BTL (IV SOLUTION) ×2 IMPLANT
PACK BASIN DAY SURGERY FS (CUSTOM PROCEDURE TRAY) ×2 IMPLANT
PENCIL BUTTON HOLSTER BLD 10FT (ELECTRODE) ×2 IMPLANT
SLEEVE SCD COMPRESS KNEE MED (MISCELLANEOUS) ×2 IMPLANT
SPONGE LAP 18X18 RF (DISPOSABLE) ×2 IMPLANT
STAPLER VISISTAT 35W (STAPLE) IMPLANT
STRIP CLOSURE SKIN 1/2X4 (GAUZE/BANDAGES/DRESSINGS) ×1 IMPLANT
SUT MON AB 4-0 PC3 18 (SUTURE) ×2 IMPLANT
SUT SILK 2 0 SH (SUTURE) IMPLANT
SUT VIC AB 2-0 SH 27 (SUTURE)
SUT VIC AB 2-0 SH 27XBRD (SUTURE) IMPLANT
SUT VIC AB 3-0 54X BRD REEL (SUTURE) IMPLANT
SUT VIC AB 3-0 BRD 54 (SUTURE)
SUT VIC AB 3-0 SH 27 (SUTURE) ×2
SUT VIC AB 3-0 SH 27X BRD (SUTURE) ×1 IMPLANT
SYR BULB 3OZ (MISCELLANEOUS) ×2 IMPLANT
SYR CONTROL 10ML LL (SYRINGE) ×2 IMPLANT
TOWEL GREEN STERILE FF (TOWEL DISPOSABLE) ×2 IMPLANT
TOWEL OR NON WOVEN STRL DISP B (DISPOSABLE) ×1 IMPLANT
TUBE CONNECTING 20X1/4 (TUBING) ×1 IMPLANT
YANKAUER SUCT BULB TIP NO VENT (SUCTIONS) ×1 IMPLANT

## 2017-10-19 NOTE — Interval H&P Note (Signed)
History and Physical Interval Note:  10/19/2017 12:59 PM  Paula Dawson  has presented today for surgery, with the diagnosis of right abnormal breast MRI  The various methods of treatment have been discussed with the patient and family. After consideration of risks, benefits and other options for treatment, the patient has consented to  Procedure(s): EXCISIONAL BIOPSY OF RIGHT BREAST (Right) as a surgical intervention .  The patient's history has been reviewed, patient examined, no change in status, stable for surgery.  I have reviewed the patient's chart and labs.  Questions were answered to the patient's satisfaction.     [name]

## 2017-10-19 NOTE — Discharge Instructions (Addendum)
[facility] °Office Phone Number [phone] ° °BREAST BIOPSY/ PARTIAL MASTECTOMY: POST OP INSTRUCTIONS ° °Always review your discharge instruction sheet given to you by the facility where your surgery was performed. ° °IF YOU HAVE DISABILITY OR FAMILY LEAVE FORMS, YOU MUST BRING THEM TO THE OFFICE FOR PROCESSING.  DO NOT GIVE THEM TO YOUR DOCTOR. ° °1. A prescription for pain medication may be given to you upon discharge.  Take your pain medication as prescribed, if needed.  If narcotic pain medicine is not needed, then you may take acetaminophen (Tylenol) or ibuprofen (Advil) as needed. °2. Take your usually prescribed medications unless otherwise directed °3. If you need a refill on your pain medication, please contact your pharmacy.  They will contact our office to request authorization.  Prescriptions will not be filled after 5pm or on week-ends. °4. You should eat very light the first 24 hours after surgery, such as soup, crackers, pudding, etc.  Resume your normal diet the day after surgery. °5. Most patients will experience some swelling and bruising in the breast.  Ice packs and a good support bra will help.  Swelling and bruising can take several days to resolve.  °6. It is common to experience some constipation if taking pain medication after surgery.  Increasing fluid intake and taking a stool softener will usually help or prevent this problem from occurring.  A mild laxative (Milk of Magnesia or Miralax) should be taken according to package directions if there are no bowel movements after 48 hours. °7. Unless discharge instructions indicate otherwise, you may remove your bandages 48 hours after surgery, and you may shower at that time.  You may have steri-strips (small skin tapes) in place directly over the incision.  These strips should be left on the skin for 7-10 days.   Any sutures or staples will be removed at the office during your follow-up visit. °8. ACTIVITIES:  You may resume  regular daily activities (gradually increasing) beginning the next day.  Wearing a good support bra or sports bra (or the breast binder) minimizes pain and swelling.  You may have sexual intercourse when it is comfortable. °a. You may drive when you no longer are taking prescription pain medication, you can comfortably wear a seatbelt, and you can safely maneuver your car and apply brakes. °b. RETURN TO WORK:  __________1 week_______________ °9. You should see your doctor in the office for a follow-up appointment approximately two weeks after your surgery.  Your doctor’s nurse will typically make your follow-up appointment when she calls you with your pathology report.  Expect your pathology report 2-3 business days after your surgery.  You may call to check if you do not hear from us after three days. ° ° °WHEN TO CALL YOUR DOCTOR: °1. Fever over 101.0 °2. Nausea and/or vomiting. °3. Extreme swelling or bruising. °4. Continued bleeding from incision. °5. Increased pain, redness, or drainage from the incision. ° °The clinic staff is available to answer your questions during regular business hours.  Please don’t hesitate to call and ask to speak to one of the nurses for clinical concerns.  If you have a medical emergency, go to the nearest emergency room or call 911.  A surgeon from [facility] is always on call at the hospital. ° °For further questions, please visit [email or web address]  ° ° °Post Anesthesia Home Care Instructions ° °Activity: °Get plenty of rest for the remainder of the day. A responsible individual must stay with you for 24   hours following the procedure.  °For the next 24 hours, DO NOT: °-Drive a car °-Operate machinery °-Drink alcoholic beverages °-Take any medication unless instructed by your physician °-Make any legal decisions or sign important papers. ° °Meals: °Start with liquid foods such as gelatin or soup. Progress to regular foods as tolerated. Avoid greasy, spicy,  heavy foods. If nausea and/or vomiting occur, drink only clear liquids until the nausea and/or vomiting subsides. Call your physician if vomiting continues. ° °Special Instructions/Symptoms: °Your throat may feel dry or sore from the anesthesia or the breathing tube placed in your throat during surgery. If this causes discomfort, gargle with warm salt water. The discomfort should disappear within 24 hours. ° °If you had a scopolamine patch placed behind your ear for the management of post- operative nausea and/or vomiting: ° °1. The medication in the patch is effective for 72 hours, after which it should be removed.  Wrap patch in a tissue and discard in the trash. Wash hands thoroughly with soap and water. °2. You may remove the patch earlier than 72 hours if you experience unpleasant side effects which may include dry mouth, dizziness or visual disturbances. °3. Avoid touching the patch. Wash your hands with soap and water after contact with the patch. °  ° °

## 2017-10-19 NOTE — Anesthesia Postprocedure Evaluation (Signed)
Anesthesia Post Note  Patient: Paula Dawson  Procedure(s) Performed: EXCISIONAL BIOPSY OF RIGHT BREAST (Right Breast)     Patient location during evaluation: PACU Anesthesia Type: Regional Level of consciousness: awake and alert Pain management: pain level controlled Vital Signs Assessment: post-procedure vital signs reviewed and stable Respiratory status: spontaneous breathing, nonlabored ventilation, respiratory function stable and patient connected to nasal cannula oxygen Cardiovascular status: blood pressure returned to baseline and stable Postop Assessment: no apparent nausea or vomiting Anesthetic complications: no    Last Vitals:  Vitals:   10/19/17 1530 10/19/17 1552  BP: 135/69 (!) 141/77  Pulse: 83 80  Resp: 15 20  Temp:  36.5 C  SpO2: 100% 100%    Last Pain:  Vitals:   10/19/17 1552  TempSrc: Oral  PainSc: 0-No pain                 Montez Hageman

## 2017-10-19 NOTE — Anesthesia Preprocedure Evaluation (Addendum)
Anesthesia Evaluation  Patient identified by MRN, date of birth, ID band Patient awake    Reviewed: Allergy & Precautions, NPO status , Patient's Chart, lab work & pertinent test results  History of Anesthesia Complications (+) PONVNegative for: history of anesthetic complications  Airway Mallampati: I  TM Distance: >3 FB Neck ROM: Full    Dental  (+) Teeth Intact, Dental Advisory Given Permanent bridge:   Pulmonary neg shortness of breath, neg sleep apnea, neg COPD, neg recent URI,  bronchiectasis   Pulmonary exam normal breath sounds clear to auscultation       Cardiovascular Exercise Tolerance: Good negative cardio ROS   Rhythm:Regular Rate:Normal     Neuro/Psych negative neurological ROS     GI/Hepatic Neg liver ROS, hiatal hernia, GERD  Medicated and Controlled,  Endo/Other  neg diabetesHypothyroidism   Renal/GU Renal disease (nephrolithiasis)     Musculoskeletal  (+) Arthritis , osteopenia   Abdominal   Peds  Hematology  (+) Blood dyscrasia, anemia ,   Anesthesia Other Findings Left breast cancer, dry eye syndrome, macular degeneration  Reproductive/Obstetrics                             Anesthesia Physical  Anesthesia Plan  ASA: III  Anesthesia Plan: General   Post-op Pain Management:  Regional for Post-op pain   Induction: Intravenous  PONV Risk Score and Plan: 3 and Ondansetron, Dexamethasone and Treatment may vary due to age or medical condition  Airway Management Planned: LMA  Additional Equipment:   Intra-op Plan:   Post-operative Plan: Extubation in OR  Informed Consent: I have reviewed the patients History and Physical, chart, labs and discussed the procedure including the risks, benefits and alternatives for the proposed anesthesia with the patient or authorized representative who has indicated his/her understanding and acceptance.   Dental advisory  given  Plan Discussed with: CRNA  Anesthesia Plan Comments: ( )       Anesthesia Quick Evaluation

## 2017-10-19 NOTE — Transfer of Care (Signed)
Immediate Anesthesia Transfer of Care Note  Patient: Paula Dawson  Procedure(s) Performed: EXCISIONAL BIOPSY OF RIGHT BREAST (Right Breast)  Patient Location: PACU  Anesthesia Type:General  Level of Consciousness: awake and sedated  Airway & Oxygen Therapy: Patient Spontanous Breathing and Patient connected to face mask oxygen  Post-op Assessment: Report given to RN and Post -op Vital signs reviewed and stable  Post vital signs: Reviewed and stable  Last Vitals:  Vitals Value Taken Time  BP 104/56 10/19/2017  2:57 PM  Temp    Pulse 78 10/19/2017  2:59 PM  Resp 12 10/19/2017  2:59 PM  SpO2 100 % 10/19/2017  2:59 PM  Vitals shown include unvalidated device data.  Last Pain:  Vitals:   10/19/17 1331  TempSrc: Oral  PainSc: 0-No pain      Patients Stated Pain Goal: 3 (22/44/97 5300)  Complications: No apparent anesthesia complications

## 2017-10-19 NOTE — Anesthesia Procedure Notes (Signed)
Procedure Name: LMA Insertion Performed by: Carnell Casamento W, CRNA Pre-anesthesia Checklist: Patient identified, Emergency Drugs available, Suction available and Patient being monitored Patient Re-evaluated:Patient Re-evaluated prior to induction Oxygen Delivery Method: Circle system utilized Preoxygenation: Pre-oxygenation with 100% oxygen Induction Type: IV induction Ventilation: Mask ventilation without difficulty LMA: LMA inserted LMA Size: 4.0 Number of attempts: 1 Placement Confirmation: positive ETCO2 Tube secured with: Tape Dental Injury: Teeth and Oropharynx as per pre-operative assessment        

## 2017-10-19 NOTE — Op Note (Signed)
Excisional Breast Biopsy  Indications: This patient presents with history of left breast cancer, and began having right nipple retraction.  Breast MRI was abnormal on the right at the nipple in the center.  Pre-operative Diagnosis: right breast nipple retraction and abnormal MRI  Post-operative Diagnosis: Same  Surgeon: [name]   Anesthesia: General LMA anesthesia and Local anesthesia 1% plain lidocaine, 0.25.% bupivacaine, with epinephrine  ASA Class: 3  Procedure Details  The patient was seen in the Holding Room. The risks, benefits, complications, treatment options, and expected outcomes were discussed with the patient. The possibilities of reaction to medication, pulmonary aspiration, bleeding, infection, the need for additional procedures, failure to diagnose a condition, and creating a complication requiring transfusion or operation were discussed with the patient. The patient concurred with the proposed plan, giving informed consent.  The site of surgery properly noted/marked. The patient was taken to Operating Room # 2, identified, and the procedure verified as right Breast Excisional Biopsy. A Time Out was held and the above information confirmed.  After induction of anesthesia, the right  breast and chest were prepped and draped in standard fashion. The nipple was excised from the areola with a #15 blade.  The underlying tissue was also excised with the cautery.  Hemostasis was achieved with cautery.  The specimen was marked with a margin marker paint kit.  The wound was irrigated and closed with a 3-0 Vicryl interrupted deep dermal stitch and a 4-0 Monocryl subcuticular closure in layers.    Sterile dressings were applied. At the end of the operation, all sponge, instrument, and needle counts were correct.  Findings: no obvious tumor, inverted central nipple  Estimated Blood Loss:  Minimal                Specimens: right breast tissue (nipple and underlying tissue).      Complications:  None; patient tolerated the procedure well.         Disposition: PACU - hemodynamically stable.         Condition: stable

## 2017-10-20 ENCOUNTER — Encounter (HOSPITAL_BASED_OUTPATIENT_CLINIC_OR_DEPARTMENT_OTHER): Payer: Self-pay | Admitting: General Surgery

## 2017-10-23 NOTE — Progress Notes (Signed)
Please let patient know pathology is benign.

## 2017-10-30 NOTE — Addendum Note (Signed)
Addendum  created 10/30/17 0738 by Montez Hageman, MD   Sign clinical note

## 2018-01-01 ENCOUNTER — Encounter: Payer: Self-pay | Admitting: Podiatry

## 2018-01-01 ENCOUNTER — Ambulatory Visit (INDEPENDENT_AMBULATORY_CARE_PROVIDER_SITE_OTHER): Payer: Medicare Other | Admitting: Podiatry

## 2018-01-01 ENCOUNTER — Ambulatory Visit (INDEPENDENT_AMBULATORY_CARE_PROVIDER_SITE_OTHER): Payer: Medicare Other

## 2018-01-01 ENCOUNTER — Other Ambulatory Visit: Payer: Self-pay | Admitting: Podiatry

## 2018-01-01 VITALS — BP 151/88 | HR 80 | Resp 16

## 2018-01-01 DIAGNOSIS — M810 Age-related osteoporosis without current pathological fracture: Secondary | ICD-10-CM | POA: Insufficient documentation

## 2018-01-01 DIAGNOSIS — M7752 Other enthesopathy of left foot: Secondary | ICD-10-CM

## 2018-01-01 DIAGNOSIS — M79672 Pain in left foot: Secondary | ICD-10-CM | POA: Diagnosis not present

## 2018-01-01 DIAGNOSIS — M779 Enthesopathy, unspecified: Secondary | ICD-10-CM

## 2018-01-01 DIAGNOSIS — E785 Hyperlipidemia, unspecified: Secondary | ICD-10-CM | POA: Insufficient documentation

## 2018-01-01 DIAGNOSIS — E559 Vitamin D deficiency, unspecified: Secondary | ICD-10-CM | POA: Insufficient documentation

## 2018-01-01 DIAGNOSIS — E039 Hypothyroidism, unspecified: Secondary | ICD-10-CM | POA: Insufficient documentation

## 2018-01-01 DIAGNOSIS — G629 Polyneuropathy, unspecified: Secondary | ICD-10-CM

## 2018-01-01 MED ORDER — TRIAMCINOLONE ACETONIDE 10 MG/ML IJ SUSP
10.0000 mg | Freq: Once | INTRAMUSCULAR | Status: AC
  Administered 2018-01-01: 10 mg

## 2018-01-01 NOTE — Progress Notes (Signed)
   Subjective:    Patient ID: Paula Dawson, female    DOB: Sep 27, 1945, 72 y.o.   MRN: [id number]  HPI    Review of Systems  All other systems reviewed and are negative.      Objective:   Physical Exam        Assessment & Plan:

## 2018-01-01 NOTE — Progress Notes (Signed)
Subjective:   Patient ID: Paula Dawson, female   DOB: 72 y.o.   MRN: [id number]   HPI Patient presents with lump on the left inner phalangeal joint of the big toe that is painful when pressed and make shoe gear difficult.  Patient's tried wider shoes and tried padding of this without relief of symptoms and states it is been present for about 6 weeks   Review of Systems  All other systems reviewed and are negative.       Objective:  Physical Exam  Constitutional: She appears well-developed and well-nourished.  Cardiovascular: Intact distal pulses.  Pulmonary/Chest: Effort normal.  Musculoskeletal: Normal range of motion.  Neurological: She is alert.  Skin: Skin is warm.  Nursing note and vitals reviewed.   Neurovascular status intact muscle strength adequate range of motion within normal limits with inflammation pain of the inner phalangeal joint left big toe with fluid buildup exostotic area     Assessment:  Strong probability for sometimes inner phalangeal joint inflammatory condition with possibility of bone spur in patient who does not smoke and likes to be active     Plan:  H&P x-ray reviewed and today I did proximal nerve block of the left big toe and then using sterile technique I injected the interphalangeal joint 3 mg Dexasone Kenalog 5 mg Xylocaine and applied padding to take pressure off the area.  I then went ahead and explained that it may ultimately require bone spur excision depending on how it does  X-ray was negative for signs that there is arthritis or spurring of the big toe joint

## 2018-01-16 ENCOUNTER — Other Ambulatory Visit: Payer: Self-pay | Admitting: Oncology

## 2018-01-16 DIAGNOSIS — Z9889 Other specified postprocedural states: Secondary | ICD-10-CM

## 2018-01-29 ENCOUNTER — Telehealth: Payer: Self-pay | Admitting: Oncology

## 2018-01-29 NOTE — Telephone Encounter (Signed)
GM PAL 9/25 - per GM moved f/u to [place] 10/1. Spoke with patient.

## 2018-01-30 ENCOUNTER — Ambulatory Visit
Admission: RE | Admit: 2018-01-30 | Discharge: 2018-01-30 | Disposition: A | Discharge status: Home / Self Care | Payer: Medicare Other | Source: Ambulatory Visit | Attending: Oncology | Admitting: Oncology

## 2018-01-30 DIAGNOSIS — Z9889 Other specified postprocedural states: Secondary | ICD-10-CM

## 2018-02-07 ENCOUNTER — Ambulatory Visit: Payer: Medicare Other | Admitting: Oncology

## 2018-02-13 ENCOUNTER — Encounter: Payer: Self-pay | Admitting: Adult Health

## 2018-02-13 ENCOUNTER — Inpatient Hospital Stay: Payer: Medicare Other | Attending: Oncology | Admitting: Adult Health

## 2018-02-13 VITALS — BP 117/76 | HR 82 | Temp 98.7°F | Resp 18 | Ht 62.0 in | Wt 120.7 lb

## 2018-02-13 DIAGNOSIS — Z801 Family history of malignant neoplasm of trachea, bronchus and lung: Secondary | ICD-10-CM | POA: Insufficient documentation

## 2018-02-13 DIAGNOSIS — E119 Type 2 diabetes mellitus without complications: Secondary | ICD-10-CM | POA: Diagnosis not present

## 2018-02-13 DIAGNOSIS — Z808 Family history of malignant neoplasm of other organs or systems: Secondary | ICD-10-CM

## 2018-02-13 DIAGNOSIS — Z79899 Other long term (current) drug therapy: Secondary | ICD-10-CM

## 2018-02-13 DIAGNOSIS — Z79810 Long term (current) use of selective estrogen receptor modulators (SERMs): Secondary | ICD-10-CM | POA: Diagnosis not present

## 2018-02-13 DIAGNOSIS — D0592 Unspecified type of carcinoma in situ of left breast: Secondary | ICD-10-CM | POA: Diagnosis present

## 2018-02-13 DIAGNOSIS — M810 Age-related osteoporosis without current pathological fracture: Secondary | ICD-10-CM | POA: Diagnosis not present

## 2018-02-13 DIAGNOSIS — Z923 Personal history of irradiation: Secondary | ICD-10-CM | POA: Diagnosis not present

## 2018-02-13 DIAGNOSIS — Z8042 Family history of malignant neoplasm of prostate: Secondary | ICD-10-CM

## 2018-02-13 DIAGNOSIS — Z803 Family history of malignant neoplasm of breast: Secondary | ICD-10-CM | POA: Diagnosis not present

## 2018-02-13 DIAGNOSIS — C50412 Malignant neoplasm of upper-outer quadrant of left female breast: Secondary | ICD-10-CM

## 2018-02-13 DIAGNOSIS — N898 Other specified noninflammatory disorders of vagina: Secondary | ICD-10-CM | POA: Insufficient documentation

## 2018-02-13 DIAGNOSIS — Z171 Estrogen receptor negative status [ER-]: Secondary | ICD-10-CM | POA: Insufficient documentation

## 2018-02-13 DIAGNOSIS — Z8041 Family history of malignant neoplasm of ovary: Secondary | ICD-10-CM | POA: Diagnosis not present

## 2018-02-13 MED ORDER — TAMOXIFEN CITRATE 20 MG PO TABS: 20.0000 mg | ORAL_TABLET | Freq: Every day | ORAL | 3 refills | Status: DC

## 2018-02-13 NOTE — Progress Notes (Signed)
Start  [phone] [phone]     ID: RANITA STJULIEN DOB: [birth year]  MR#: [id number]  BDZ#:[id number]  Patient Care Team: Harlan Stains, MD as PCP - General (Family Medicine) [name], MD as Consulting Physician (General Surgery) Magrinat, Virgie Dad, MD as Consulting Physician (Oncology) Kyung Rudd, MD as Consulting Physician (Radiation Oncology) Kem Boroughs, FNP as Nurse Practitioner (Family Medicine) OTHER MD:  CHIEF COMPLAINT: Ductal carcinoma in situ  CURRENT TREATMENT: Tamoxifen   BREAST CANCER HISTORY: From the original intake note:  Geoffrey had bilateral screening mammography at the [facility] 01/06/2016, showing a possible mass in the left breast. On 01/13/2016 she had left diagnostic mammography with tomography and left breast ultrasonography. The breast density was category C. In the upper outer quadrant of the left breast there was an area of asymmetry measuring 0.8 cm. On physical exam there was no palpable mass. Ultrasonography confirmed an irregular hypoechoic mass at the 2:00 position 6 cm from the nipple measuring 1.4 cm. Ultrasound of the left axilla was benign.  On 01/15/2016 the patient underwent biopsy of the left breast mass in question, and this showed ([id number]) ductal carcinoma in situ, grade 3, estrogen receptor negative, progesterone receptor questionably positive (20% with weak staining intensity).  Her subsequent history is as detailed below  INTERVAL HISTORY: Shaquandra is here today for f/u of her estrogen positive breast cancer.  She is taking Tamoxifen daily.  She is tolerating it well.  She has had some mild vaginal discharge, nothing that she notes are describes as increased.  She denies arthralgias and hot flashes.  She does note some arthritic pains in her back.    In Spring of 2019 her gynecologist noted an inverted nipple.  Mammogram and ultrasound were negative.  MRI was done and noted  focal indeterminate enhancement.  Dr. Barry Dienes did a nipple biopsy on 10/19/2017 that was negative and showed periductal inflammation.  REVIEW OF SYSTEMS: Sheronda is doing moderately well.  She wants to know what to do about her Zantac OTC. She has stopped taking the Zantac due to it being pulled from the market by the FDA.Caprina sees her PCP regularly.  They manage her bone density and osteoporosis.  She receives Prolia injections every 6 months.  She is due for colonoscopy in 03/2018 and has this set up.  She is seeing GYN regularly.  She sees dermatology regularly.  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  She underwent her mammogram in 01/2018 that was normal.  Breast density is category c.  She exercises by walking.  She has been down due to toe issues and her healing from her skin surgery.  She typically walks 3 days a week and is easing herself into that.  Otherwise Anaysha is doing well and a detailed ROS was non contributory today.  PAST MEDICAL HISTORY: Past Medical History:  Diagnosis Date  . Anemia   . Arthritis   . BRCA gene mutation negative 03/2016  . Breast cancer ([place])    left 2017  . Breast cancer in female [facility]) 12/2015   left breast cancer, Estrogen negative, progesterone slight positive  . Complication of anesthesia   . Dry eye syndrome   . Early cataract   . Family history of breast cancer   . Family history of ovarian cancer   . Family history of prostate cancer   . Gall stones   . GERD (gastroesophageal reflux disease)   . History  of hiatal hernia   . HSV infection    pos I and II  . Kidney stones   . Macular degeneration of both eyes   . Osteopenia   . Osteoporosis 1998   Off Fosomax 2008, Prolia 03/17/11, 09/14/11, 03/16/12  . Personal history of radiation therapy   . PONV (postoperative nausea and vomiting)   . Thyroid disease age 72   hypo  . Wears glasses     PAST SURGICAL HISTORY: Past Surgical History:  Procedure Laterality Date    . AUGMENTATION MAMMAPLASTY  1981  . BREAST IMPLANT REMOVAL  1982  . BREAST LUMPECTOMY     left 2017  . BREAST LUMPECTOMY WITH RADIOACTIVE SEED AND SENTINEL LYMPH NODE BIOPSY Left 02/11/2016   Procedure: LEFT BREAST LUMPECTOMY WITH RADIOACTIVE SEED AND LEFT SENTINEL LYMPH NODE BIOPSY;  Surgeon: [name], MD;  Location: [place];  Service: General;  Laterality: Left;  . COLONOSCOPY  10/04   Dr Watt Climes  . COLONOSCOPY  04/07/08   normal - Dr. Watt Climes  . ELBOW SURGERY Left 12/1997  . ESOPHAGOGASTRODUODENOSCOPY ENDOSCOPY  08/2012   [place] hernia with GERD  . EXCISION OF BREAST BIOPSY Right 10/19/2017   Procedure: EXCISIONAL BIOPSY OF RIGHT BREAST;  Surgeon: [name], MD;  Location: [place];  Service: General;  Laterality: Right;  . FOOT NEUROMA SURGERY Left 2004?  . TONSILLECTOMY AND ADENOIDECTOMY    . TUBAL LIGATION  1981    FAMILY HISTORY Family History  Problem Relation Age of Onset  . Heart failure Mother   . Prostate cancer Father 62  . Liver cancer Father   . Colon polyps Sister   . Breast cancer Sister 50  . Breast cancer Sister 74  . Ovarian cancer Cousin        died at 16  . Breast cancer Sister 49       recurrance at 42  . Breast cancer Sister 68  . Colon polyps Sister   . Breast cancer Other        X 4 + age 5, 61 with recurrence, 34, 66  . Lung cancer Brother        smoked  . Breast cancer Paternal Aunt   . Prostate cancer Brother 60  . Melanoma Brother   . Colon cancer Other 83  The patient's father died at age 27. He had been diagnosed with prostate "and liver" cancer at the age of 4. The patient's mother died at age 101. The patient had 4 brothers, 5 sisters. One brother was diagnosed with lung cancer at age 44. She has 4 sisters with breast cancer, one initially diagnosed at age 37, recurring at age 34.  GYNECOLOGIC HISTORY:  Patient's last menstrual period was 05/16/2000 (approximate). Menarche age 6, first live birth age 7, the patient is  GX P1. She stopped having periods approximately 2002. She used hormone replacement for approximately 5 years. 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 She is divorced and lives alone, with 1. Her son Khamila Bassinger lives in [place]. His wife is a Cabin crew and he is her handyman. The patient has 2 grandsons. She attends a [employer] in the [place] area    ADVANCED DIRECTIVES: In place; the patient has named her son [name] as her healthcare part of attorney. He can be reached at [phone].   HEALTH MAINTENANCE:  Social History   Tobacco Use  . Smoking status: Never Smoker  . Smokeless tobacco: Never Used  Substance Use Topics  . Alcohol use: No  . Drug use: No     Colonoscopy: 2014/May got  PAP:  Bone density: December 2016, T score -2.9   Allergies  Allergen Reactions  . Vicodin [Hydrocodone-Acetaminophen] Other (See Comments)    Pt prefers not to take because it knocks her out    Current Outpatient Medications  Medication Sig Dispense Refill  . B Complex Vitamins (B COMPLEX-B12 PO) Take by mouth daily.    Marland Kitchen Bioflavonoid Products (C COMPLEX PO) Take by mouth 1 day or 1 dose.    . Calcium Carbonate (CALCIUM 600 PO) Take by mouth daily.    . Carboxymethylcellulose Sod PF (RETAINE CMC) 0.5 % SOLN Apply 1 drop to eye 4 (four) times daily as needed.    . Cholecalciferol (VITAMIN D3) 5000 units CAPS Take 1 capsule by mouth daily.    Marland Kitchen denosumab (PROLIA) 60 MG/ML SOLN injection Inject 60 mg into the skin every 6 (six) months. Administer in upper arm, thigh, or abdomen    . Ferrous Sulfate (IRON) 28 MG TABS Take 28 mg by mouth daily.    . Multiple Vitamin (MULTI-VITAMIN PO) Take by mouth 2 (two) times daily as needed. Women's 50 +    . Multiple Vitamins-Minerals (ICAPS AREDS 2) CAPS Take by mouth 2 (two) times daily.    . ranitidine (ZANTAC)  150 MG capsule Take 150 mg by mouth 2 (two) times daily.     . RESTASIS 0.05 % ophthalmic emulsion Place 1 drop into both eyes 2 (two) times daily.     Marland Kitchen SYNTHROID 100 MCG tablet Take 1 tablet by mouth daily.    Derrill Memo ON 04/15/2018] tamoxifen (NOLVADEX) 20 MG tablet Take 1 tablet (20 mg total) by mouth daily. 90 tablet 3  . valACYclovir (VALTREX) 500 MG tablet Take one tablet a day, can increase 1 tablet BID for 3 days if she has an outbreak. 180 tablet 3   No current facility-administered medications for this visit.     OBJECTIVE:  Vitals:   02/13/18 1303  BP: 117/76  Pulse: 82  Resp: 18  Temp: 98.7 F (37.1 C)  SpO2: 98%     Body mass index is 22.08 kg/m.    ECOG FS:0 - Asymptomatic  GENERAL: Patient is a well appearing female in no acute distress HEENT:  Sclerae anicteric.  Oropharynx clear and moist. No ulcerations or evidence of oropharyngeal candidiasis. Neck is supple.  NODES:  No cervical, supraclavicular, or axillary lymphadenopathy palpated.  BREAST EXAM: right breast benign, left breast s/p lumpectomy, no evidence of local recurrence LUNGS:  Clear to auscultation bilaterally.  No wheezes or rhonchi. HEART:  Regular rate and rhythm. No murmur appreciated. ABDOMEN:  Soft, nontender.  Positive, normoactive bowel sounds. No organomegaly palpated. MSK:  No focal spinal tenderness to palpation. Full range of motion bilaterally in the upper extremities. EXTREMITIES:  No peripheral edema.   SKIN:  Clear with no obvious rashes or skin changes. No nail dyscrasia. NEURO:  Nonfocal. Well oriented.  Appropriate affect.    LAB RESULTS:  CMP     Component Value Date/Time   NA 141 02/09/2016 1328   NA 142 01/27/2016 1223   K 3.7 02/09/2016 1328   K 3.6 01/27/2016 1223   CL 108 02/09/2016 1328   CO2 25 02/09/2016 1328   CO2 27 01/27/2016 1223   GLUCOSE  110 (H) 02/09/2016 1328   GLUCOSE 131 01/27/2016 1223   BUN 14 02/09/2016 1328   BUN 14.8 01/27/2016 1223   CREATININE  0.73 02/09/2016 1328   CREATININE 0.8 01/27/2016 1223   CALCIUM 9.1 02/09/2016 1328   CALCIUM 9.1 01/27/2016 1223   PROT 6.6 01/27/2016 1223   ALBUMIN 3.6 01/27/2016 1223   AST 21 01/27/2016 1223   ALT 21 01/27/2016 1223   ALKPHOS 93 01/27/2016 1223   BILITOT 0.36 01/27/2016 1223   GFRNONAA >60 02/09/2016 1328   GFRAA >60 02/09/2016 1328    INo results found for: SPEP, UPEP  Lab Results  Component Value Date   WBC 10.9 (H) 02/09/2016   NEUTROABS 7.0 (H) 01/27/2016   HGB 14.9 02/09/2016   HCT 44.7 02/09/2016   MCV 96.1 02/09/2016   PLT 255 02/09/2016      Chemistry      Component Value Date/Time   NA 141 02/09/2016 1328   NA 142 01/27/2016 1223   K 3.7 02/09/2016 1328   K 3.6 01/27/2016 1223   CL 108 02/09/2016 1328   CO2 25 02/09/2016 1328   CO2 27 01/27/2016 1223   BUN 14 02/09/2016 1328   BUN 14.8 01/27/2016 1223   CREATININE 0.73 02/09/2016 1328   CREATININE 0.8 01/27/2016 1223      Component Value Date/Time   CALCIUM 9.1 02/09/2016 1328   CALCIUM 9.1 01/27/2016 1223   ALKPHOS 93 01/27/2016 1223   AST 21 01/27/2016 1223   ALT 21 01/27/2016 1223   BILITOT 0.36 01/27/2016 1223       No results found for: LABCA2  No components found for: LABCA125  No results for input(s): INR in the last 168 hours.  Urinalysis    Component Value Date/Time   BILIRUBINUR n 07/18/2017 1017   PROTEINUR n 07/18/2017 1017   UROBILINOGEN 0.2 07/18/2017 1017   NITRITE n 07/18/2017 1017   LEUKOCYTESUR Moderate (2+) (A) 07/18/2017 1017     STUDIES: Mm Diag Breast Tomo Bilateral  Result Date: 01/30/2018 CLINICAL DATA:  72 year old female presenting for annual bilateral mammogram status post malignant left lumpectomy in 2017. No current symptoms. EXAM: DIGITAL DIAGNOSTIC BILATERAL MAMMOGRAM WITH CAD AND TOMO COMPARISON:  Previous exam(s). ACR Breast Density Category c: The breast tissue is heterogeneously dense, which may obscure small masses. FINDINGS: Stable postsurgical  changes are noted in the far posterior upper outer left breast. No new or suspicious findings are identified in either breast. The parenchymal pattern is stable. Mammographic images were processed with CAD. IMPRESSION: 1. No mammographic evidence of malignancy in either breast. 2. Stable left breast posttreatment changes. RECOMMENDATION: Diagnostic mammogram is suggested in 1 year. (Code:[id number]-B-01Y) I have discussed the findings and recommendations with the patient. Results were also provided in writing at the conclusion of the visit. If applicable, a reminder letter will be sent to the patient regarding the next appointment. BI-RADS CATEGORY  2: Benign. Electronically Signed   By: Kristopher Oppenheim M.D.   On: 01/30/2018 11:23   On 12/23/2016 she had a CT of the chest without contrast compared to a year prior for follow-up of pulmonary nodules, with no CT findings suspicious for metastatic disease and no new or acute findings. There were stable radiation changes involving the left anterior lung ELIGIBLE FOR AVAILABLE RESEARCH PROTOCOL: no  ASSESSMENT: 72 y.o. [place] woman status post left breast upper outer quadrant biopsy 01/15/2016 for ductal carcinoma in situ, grade 3, estrogen receptor negative, progesterone receptor questionably positive  (1) Status  post left lumpectomy and sentinel lymph node sampling 02/11/2016 for a pTis pN0, stage 0 ductal carcinoma in situ, grade 3, with negative margins.  (2) adjuvant radiation 03/10/2016 through 04/06/2016 Site/dose:   The patient initially received a dose of 42.5 Gy in 17 fractions to the breast using whole-breast tangent fields. This was delivered using a 3-D conformal technique. The patient then received a boost to the seroma. This delivered an additional 7.5 Gy in 3 fractions using a 3 field photon technique due to the depth of the seroma. The total dose was 50 Gy.  (3) consider genetics 03/21/2016 through the Breast/Ovarian cancer gene panel.offered by  [facility] found no deleterious mutations in  ATM, BARD1, BRCA1, BRCA2, BRIP1, CDH1, CHEK2, EPCAM, FANCC, MLH1, MSH2, MSH6, NBN, PALB2, PMS2, PTEN, RAD51C, RAD51D, TP53, and XRCC2  (4) tamoxifen started December 2017  PLAN: Brystol is doing well today.  She is without any clinical or radiographic sign of recurrence.  She is taking Tamoxifen daily and she is tolerating it well.  She will continue this.    I reviewed health maintenance recommendations which include healthy diet, exercise, and keeping up with her cancer screenings with her PCP.  We reviewed this in detail.   We will see Linsy back in 1 year for labs and f/u with Dr. Jana Hakim.  She will see Dr. Barry Dienes in 6 months.  She knows to call for any questions or concerns prior to her next appointment with [place].    A total of (30) minutes of face-to-face time was spent with this patient with greater than 50% of that time in counseling and care-coordination.  Wilber Bihari, NP  02/13/18 1:37 PM Medical Oncology and Hematology [facility] [address] [place], [place] [address] Tel. [phone]    Fax. [phone]

## 2018-02-13 NOTE — Patient Instructions (Signed)
Bone Health Bones protect organs, store calcium, and anchor muscles. Good health habits, such as eating nutritious foods and exercising regularly, are important for maintaining healthy bones. They can also help to prevent a condition that causes bones to lose density and become weak and brittle (osteoporosis). Why is bone mass important? Bone mass refers to the amount of bone tissue that you have. The higher your bone mass, the stronger your bones. An important step toward having healthy bones throughout life is to have strong and dense bones during childhood. A young adult who has a high bone mass is more likely to have a high bone mass later in life. Bone mass at its greatest it is called peak bone mass. A large decline in bone mass occurs in older adults. In women, it occurs about the time of menopause. During this time, it is important to practice good health habits, because if more bone is lost than what is replaced, the bones will become less healthy and more likely to break (fracture). If you find that you have a low bone mass, you may be able to prevent osteoporosis or further bone loss by changing your diet and lifestyle. How can I find out if my bone mass is low? Bone mass can be measured with an X-ray test that is called a bone mineral density (BMD) test. This test is recommended for all women who are age 65 or older. It may also be recommended for men who are age 70 or older, or for people who are more likely to develop osteoporosis due to:  Having bones that break easily.  Having a long-term disease that weakens bones, such as kidney disease or rheumatoid arthritis.  Having menopause earlier than normal.  Taking medicine that weakens bones, such as steroids, thyroid hormones, or hormone treatment for breast cancer or prostate cancer.  Smoking.  Drinking three or more alcoholic drinks each day.  What are the nutritional recommendations for healthy bones? To have healthy bones, you  need to get enough of the right minerals and vitamins. Most nutrition experts recommend getting these nutrients from the foods that you eat. Nutritional recommendations vary from person to person. Ask your health care provider what is healthy for you. Here are some general guidelines. Calcium Recommendations Calcium is the most important (essential) mineral for bone health. Most people can get enough calcium from their diet, but supplements may be recommended for people who are at risk for osteoporosis. Good sources of calcium include:  Dairy products, such as low-fat or nonfat milk, cheese, and yogurt.  Dark green leafy vegetables, such as bok choy and broccoli.  Calcium-fortified foods, such as orange juice, cereal, bread, soy beverages, and tofu products.  Nuts, such as almonds.  Follow these recommended amounts for daily calcium intake:  Children, age 1?3: 700 mg.  Children, age 4?8: 1,000 mg.  Children, age 9?13: 1,300 mg.  Teens, age 14?18: 1,300 mg.  Adults, age 19?50: 1,000 mg.  Adults, age 51?70: ? Men: 1,000 mg. ? Women: 1,200 mg.  Adults, age 71 or older: 1,200 mg.  Pregnant and breastfeeding females: ? Teens: 1,300 mg. ? Adults: 1,000 mg.  Vitamin D Recommendations Vitamin D is the most essential vitamin for bone health. It helps the body to absorb calcium. Sunlight stimulates the skin to make vitamin D, so be sure to get enough sunlight. If you live in a cold climate or you do not get outside often, your health care provider may recommend that you take vitamin   D supplements. Good sources of vitamin D in your diet include:  Egg yolks.  Saltwater fish.  Milk and cereal fortified with vitamin D.  Follow these recommended amounts for daily vitamin D intake:  Children and teens, age 1?18: 600 international units.  Adults, age 50 or younger: 400-800 international units.  Adults, age 51 or older: 800-1,000 international units.  Other Nutrients Other nutrients  for bone health include:  Phosphorus. This mineral is found in meat, poultry, dairy foods, nuts, and legumes. The recommended daily intake for adult men and adult women is 700 mg.  Magnesium. This mineral is found in seeds, nuts, dark green vegetables, and legumes. The recommended daily intake for adult men is 400?420 mg. For adult women, it is 310?320 mg.  Vitamin K. This vitamin is found in green leafy vegetables. The recommended daily intake is 120 mg for adult men and 90 mg for adult women.  What type of physical activity is best for building and maintaining healthy bones? Weight-bearing and strength-building activities are important for building and maintaining peak bone mass. Weight-bearing activities cause muscles and bones to work against gravity. Strength-building activities increases muscle strength that supports bones. Weight-bearing and muscle-building activities include:  Walking and hiking.  Jogging and running.  Dancing.  Gym exercises.  Lifting weights.  Tennis and racquetball.  Climbing stairs.  Aerobics.  Adults should get at least 30 minutes of moderate physical activity on most days. Children should get at least 60 minutes of moderate physical activity on most days. Ask your health care provide what type of exercise is best for you. Where can I find more information? For more information, check out the following websites:  National Osteoporosis Foundation: http://nof.org/learn/basics  National Institutes of Health: http://www.niams.nih.gov/Health_Info/Bone/Bone_Health/bone_health_for_life.asp  This information is not intended to replace advice given to you by your health care provider. Make sure you discuss any questions you have with your health care provider. Document Released: 07/23/2003 Document Revised: 11/20/2015 Document Reviewed: 05/07/2014 Elsevier Interactive Patient Education  2018 Elsevier Inc.  

## 2018-03-02 ENCOUNTER — Other Ambulatory Visit: Payer: Self-pay

## 2018-03-02 ENCOUNTER — Other Ambulatory Visit (HOSPITAL_COMMUNITY)
Admission: RE | Admit: 2018-03-02 | Discharge: 2018-03-02 | Disposition: A | Discharge status: Home / Self Care | Payer: Medicare Other | Source: Ambulatory Visit | Attending: Obstetrics and Gynecology | Admitting: Obstetrics and Gynecology

## 2018-03-02 ENCOUNTER — Telehealth: Payer: Self-pay | Admitting: Obstetrics and Gynecology

## 2018-03-02 ENCOUNTER — Ambulatory Visit (INDEPENDENT_AMBULATORY_CARE_PROVIDER_SITE_OTHER): Payer: Medicare Other | Admitting: Obstetrics and Gynecology

## 2018-03-02 ENCOUNTER — Encounter: Payer: Self-pay | Admitting: Obstetrics and Gynecology

## 2018-03-02 VITALS — BP 126/70 | HR 76 | Temp 98.0°F | Ht 62.0 in | Wt 121.8 lb

## 2018-03-02 DIAGNOSIS — Z124 Encounter for screening for malignant neoplasm of cervix: Secondary | ICD-10-CM

## 2018-03-02 DIAGNOSIS — Z79810 Long term (current) use of selective estrogen receptor modulators (SERMs): Secondary | ICD-10-CM

## 2018-03-02 DIAGNOSIS — Z5181 Encounter for therapeutic drug level monitoring: Secondary | ICD-10-CM

## 2018-03-02 DIAGNOSIS — R319 Hematuria, unspecified: Secondary | ICD-10-CM | POA: Diagnosis not present

## 2018-03-02 DIAGNOSIS — R300 Dysuria: Secondary | ICD-10-CM | POA: Diagnosis not present

## 2018-03-02 LAB — POCT URINALYSIS DIPSTICK
Bilirubin, UA: NEGATIVE
Glucose, UA: NEGATIVE
Ketones, UA: NEGATIVE
NITRITE UA: NEGATIVE
PH UA: 8 (ref 5.0–8.0)
PROTEIN UA: NEGATIVE
UROBILINOGEN UA: 0.2 U/dL

## 2018-03-02 MED ORDER — SULFAMETHOXAZOLE-TRIMETHOPRIM 800-160 MG PO TABS: 1.0000 | ORAL_TABLET | Freq: Two times a day (BID) | ORAL | 0 refills | Status: DC

## 2018-03-02 NOTE — Telephone Encounter (Signed)
Spoke with patient. Patient states she has been up since 4am with "waves" of pelvic pain and pressure, 10/10. Reports vaginal bleeding, describes flow as just heavier than spotting. Urinary frequency with "medium" amount of output. Feels pressure like she still has to void. Has increased fluid intake this morning. Denies fever/chills, N/V, lower back pain.   Patient request to be seen today. Advised Dr. Talbert Nan is out of the office, agreeable to seeing covering provider. OV scheduled with Dr. Quincy Simmonds for 11:30am.  Routing to covering provider for final review. Patient is agreeable to disposition. Will close encounter.  Cc: Dr. Talbert Nan

## 2018-03-02 NOTE — Telephone Encounter (Signed)
Patient is having pain and has noticed some blood.

## 2018-03-02 NOTE — Patient Instructions (Signed)

## 2018-03-02 NOTE — Progress Notes (Signed)
GYNECOLOGY  VISIT   HPI: 72 y.o.   Divorced  Caucasian  female   [id number] with Patient's last menstrual period was 05/16/2000 (approximate).   here for urinary frequency/urgency and dysuria. She's unsure if she is seeing blood in urine or blood coming from vagina. Patient did have a benign cervical polyp 07-24-17 removed.  From 2:00 am - 10:00 am had excruciating pain in the vaginal area.  Came in waves.  When it came, she felt the need to urinate.  She then saw blood when she voided starting at 8:00.  Pain is now a 2 instead of a 10.  Otherwise does not really have burning with urination.   No rectal bleeding.  This morning had a small bowel movement.  This was not painful. It also did not relieve her pain.   Hx cervical polyp removal.   Urine Dip: 2+WBCs, 1+RBCs  GYNECOLOGIC HISTORY: Patient's last menstrual period was 05/16/2000 (approximate). Contraception:  Postmenopausal Menopausal hormone therapy:  none Last mammogram:  01-30-18 Diag.Bil./Neg/Density C/BiRads2 Last pap smear: 05-29-14 Neg        OB History    Gravida  2   Para  2   Term  2   Preterm  0   AB  0   Living  1     SAB  0   TAB  0   Ectopic  0   Multiple  0   Live Births  1              Patient Active Problem List   Diagnosis Date Noted  . Adult hypothyroidism 01/01/2018  . Avitaminosis D 01/01/2018  . Dyslipidemia 01/01/2018  . OP (osteoporosis) 01/01/2018  . 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 Genetic testing 03/22/2016  . Family history of breast cancer   . Family history of ovarian cancer   . Family history of prostate cancer   . Breast cancer of upper-outer quadrant of left female breast ([place]) 01/20/2016  . Bronchiectasis without acute exacerbation ([place])  with MPNs 12/11/2015    Past Medical History:  Diagnosis Date  . Anemia   . Arthritis   . BRCA gene mutation negative 03/2016  . Breast cancer ([place])    left 2017  . Breast cancer in female [facility])  12/2015   left breast cancer, Estrogen negative, progesterone slight positive  . Complication of anesthesia   . Dry eye syndrome   . Early cataract   . Family history of breast cancer   . Family history of ovarian cancer   . Family history of prostate cancer   . Gall stones   . GERD (gastroesophageal reflux disease)   . History of hiatal hernia   . HSV infection    pos I and II  . Kidney stones   . Macular degeneration of both eyes   . Osteopenia   . Osteoporosis 1998   Off Fosomax 2008, Prolia 03/17/11, 09/14/11, 03/16/12  . Personal history of radiation therapy   . PONV (postoperative nausea and vomiting)   . Thyroid disease age 13   hypo  . Wears glasses     Past Surgical History:  Procedure Laterality Date  . AUGMENTATION MAMMAPLASTY  1981  . BREAST IMPLANT REMOVAL  1982  . BREAST LUMPECTOMY     left 2017  . BREAST LUMPECTOMY WITH RADIOACTIVE SEED AND SENTINEL LYMPH NODE BIOPSY Left 02/11/2016   Procedure: LEFT BREAST LUMPECTOMY WITH RADIOACTIVE SEED AND LEFT SENTINEL LYMPH NODE BIOPSY;  Surgeon: [name], MD;  Location: [place] [facility];  Service: General;  Laterality: Left;  . COLONOSCOPY  10/04   Dr Watt Climes  . COLONOSCOPY  04/07/08   normal - Dr. Watt Climes  . ELBOW SURGERY Left 12/1997  . ESOPHAGOGASTRODUODENOSCOPY ENDOSCOPY  08/2012   [place] hernia with GERD  . EXCISION OF BREAST BIOPSY Right 10/19/2017   Procedure: EXCISIONAL BIOPSY OF RIGHT BREAST;  Surgeon: [name], MD;  Location: [facility];  Service: General;  Laterality: Right;  . FOOT NEUROMA SURGERY Left 2004?  . TONSILLECTOMY AND ADENOIDECTOMY    . TUBAL LIGATION  1981    Current Outpatient Medications  Medication Sig Dispense Refill  . B Complex Vitamins (B COMPLEX-B12 PO) Take by mouth daily.    Marland Kitchen Bioflavonoid Products (C COMPLEX PO) Take by mouth 1 day or 1 dose.    . Calcium Carbonate (CALCIUM 600 PO) Take by mouth daily.    . Carboxymethylcellulose Sod PF (RETAINE CMC) 0.5 % SOLN Apply 1 drop  to eye 4 (four) times daily as needed.    . Cholecalciferol (VITAMIN D3) 5000 units CAPS Take 1 capsule by mouth daily.    Marland Kitchen denosumab (PROLIA) 60 MG/ML SOLN injection Inject 60 mg into the skin every 6 (six) months. Administer in upper arm, thigh, or abdomen    . famotidine (PEPCID AC) 10 MG tablet Take 10 mg by mouth 2 (two) times daily.    . Ferrous Sulfate (IRON) 28 MG TABS Take 28 mg by mouth daily.    . Multiple Vitamin (MULTI-VITAMIN PO) Take by mouth 2 (two) times daily as needed. Women's 50 +    . Multiple Vitamins-Minerals (ICAPS AREDS 2) CAPS Take by mouth 2 (two) times daily.    . RESTASIS 0.05 % ophthalmic emulsion Place 1 drop into both eyes 2 (two) times daily.     Marland Kitchen SYNTHROID 100 MCG tablet Take 1 tablet by mouth daily.    Derrill Memo ON 04/15/2018] tamoxifen (NOLVADEX) 20 MG tablet Take 1 tablet (20 mg total) by mouth daily. 90 tablet 3  . valACYclovir (VALTREX) 500 MG tablet Take one tablet a day, can increase 1 tablet BID for 3 days if she has an outbreak. 180 tablet 3   No current facility-administered medications for this visit.      ALLERGIES: Vicodin [hydrocodone-acetaminophen]  Family History  Problem Relation Age of Onset  . Heart failure Mother   . Prostate cancer Father 96  . Liver cancer Father   . Colon polyps Sister   . Breast cancer Sister 19  . Breast cancer Sister 43  . Ovarian cancer Cousin        died at 58  . Breast cancer Sister 41       recurrance at 60  . Breast cancer Sister 42  . Colon polyps Sister   . Breast cancer Other        X 4 + age 69, 68 with recurrence, 44, 62  . Lung cancer Brother        smoked  . Breast cancer Paternal Aunt   . Prostate cancer Brother 50  . Melanoma Brother   . Colon cancer Other 52    Social History   Socioeconomic History  . Marital status: Divorced    Spouse name: Not on file  . Number of children: 1  . Years of education: Not on file  . Highest education level: Not on file  Occupational History  .  Not on file  Social Needs  . Financial resource strain: Not on file  . Food insecurity:    Worry: Not on file    Inability: Not on file  . Transportation needs:    Medical: Not on file    Non-medical: Not on file  Tobacco Use  . Smoking status: Never Smoker  . Smokeless tobacco: Never Used  Substance and Sexual Activity  . Alcohol use: No  . Drug use: No  . Sexual activity: Not Currently    Partners: Male    Birth control/protection: Post-menopausal  Lifestyle  . Physical activity:    Days per week: Not on file    Minutes per session: Not on file  . Stress: Not on file  Relationships  . Social connections:    Talks on phone: Not on file    Gets together: Not on file    Attends religious service: Not on file    Active member of club or organization: Not on file    Attends meetings of clubs or organizations: Not on file    Relationship status: Not on file  . Intimate partner violence:    Fear of current or ex partner: Not on file    Emotionally abused: Not on file    Physically abused: Not on file    Forced sexual activity: Not on file  Other Topics Concern  . Not on file  Social History Narrative  . Not on file    Review of Systems  Genitourinary: Positive for dysuria, frequency and hematuria.       Nocturia Loss of urine with cough or sneeze  All other systems reviewed and are negative.   PHYSICAL EXAMINATION:    BP 126/70 (BP Location: Right Arm, Patient Position: Sitting, Cuff Size: Normal)   Pulse 76   Temp 98 F (36.7 C) (Oral)   Ht _0  (1.575 m)   Wt 121 lb 12.8 oz (55.2 kg)   LMP 05/16/2000 (Approximate)   BMI 22.28 kg/m     General appearance: alert, cooperative and appears stated age Head: Normocephalic, without obvious abnormality, atraumatic Neck: no adenopathy, supple, symmetrical, trachea midline and thyroid normal to inspection and palpation Lungs: clear to auscultation bilaterally Heart: regular rate and rhythm Abdomen: soft,  non-tender, no masses,  no organomegaly Extremities: extremities normal, atraumatic, no cyanosis or edema No abnormal inguinal nodes palpated Neurologic: Grossly normal  Pelvic: External genitalia:  no lesions              Urethra:  normal appearing urethra with no masses, tenderness or lesions              Bartholins and Skenes: normal                 Vagina: normal appearing vagina with normal color and discharge, no lesions              Cervix: no lesions                Bimanual Exam:  Uterus:  normal size, contour, position, consistency, mobility, non-tender              Adnexa: no mass, fullness, tenderness              Rectal exam: Yes.  .  Confirms.              Anus:  normal sphincter tone, no lesions  Chaperone was present for exam.  ASSESSMENT  Hx renal stones.  Probable passed stone. Hx  breast cancer and is on Tamoxifen.  PLAN  We discussed kidney stones. Bactrim DS. Urine micro and culture.  Hydrate well.  Pap taken.  We discussed effect of Tamoxifen on the endometrium and the importance of analysis for this.  Return next week for pelvic US and possible EMB with Dr. Talbert Nan.  To ER if develops significant pain or bleeding during the weekend.    An After Visit Summary was printed and given to the patient.  __25____ minutes face to face time of which over 50% was spent in counseling.

## 2018-03-02 NOTE — Progress Notes (Signed)
Patient scheduled while in office for PUS and possible EMB on 03/06/18 at 3:30pm, consult with Dr. Talbert Nan at [place] to take Motrin 800 mg with food and water one hour before procedure. Orders placed for precert, patient aware she will be called with benefits. Patient verbalizes understanding and is agreeable.

## 2018-03-03 LAB — URINALYSIS, MICROSCOPIC ONLY: Casts: NONE SEEN /lpf

## 2018-03-04 LAB — URINE CULTURE

## 2018-03-05 ENCOUNTER — Telehealth: Payer: Self-pay | Admitting: Obstetrics and Gynecology

## 2018-03-05 NOTE — Progress Notes (Signed)
GYNECOLOGY  VISIT   HPI: 72 y.o.   Divorced White or Caucasian Not Hispanic or Latino  female   [phone] with Patient's last menstrual period was 05/16/2000 (approximate).   here for consult following PUS. She was seen last week with bladder symptoms and reported possible vaginal bleeding. Urine culture was +, patient never got the message but did take the bactrim (still on it). She has excruciating pain for 8 hours, had blood in her urine. Feels she passed a kidney stone. So no bladder symptoms now at all, no frequency, no urgency, no pain, no bleeding.   Pap 03/02/18, results pending  Non smoker, no ETOH.    GYNECOLOGIC HISTORY: Patient's last menstrual period was 05/16/2000 (approximate). Contraception:Postmenopausal  Menopausal hormone therapy: None        OB History    Gravida  2   Para  2   Term  2   Preterm  0   AB  0   Living  1     SAB  0   TAB  0   Ectopic  0   Multiple  0   Live Births  1              Patient Active Problem List   Diagnosis Date Noted  . Adult hypothyroidism 01/01/2018  . Avitaminosis D 01/01/2018  . Dyslipidemia 01/01/2018  . OP (osteoporosis) 01/01/2018  . 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 Genetic testing 03/22/2016  . Family history of breast cancer   . Family history of ovarian cancer   . Family history of prostate cancer   . Breast cancer of upper-outer quadrant of left female breast ([place]) 01/20/2016  . Bronchiectasis without acute exacerbation ([place])  with MPNs 12/11/2015    Past Medical History:  Diagnosis Date  . Anemia   . Arthritis   . BRCA gene mutation negative 03/2016  . Breast cancer ([place])    left 2017  . Breast cancer in female [facility]) 12/2015   left breast cancer, Estrogen negative, progesterone slight positive  . Complication of anesthesia   . Dry eye syndrome   . Early cataract   . Family history of breast cancer   . Family history of ovarian cancer   . Family history of prostate  cancer   . Gall stones   . GERD (gastroesophageal reflux disease)   . History of hiatal hernia   . HSV infection    pos I and II  . Kidney stones   . Macular degeneration of both eyes   . Osteopenia   . Osteoporosis 1998   Off Fosomax 2008, Prolia 03/17/11, 09/14/11, 03/16/12  . Personal history of radiation therapy   . PONV (postoperative nausea and vomiting)   . Thyroid disease age 35   hypo  . Wears glasses     Past Surgical History:  Procedure Laterality Date  . AUGMENTATION MAMMAPLASTY  1981  . BREAST IMPLANT REMOVAL  1982  . BREAST LUMPECTOMY     left 2017  . BREAST LUMPECTOMY WITH RADIOACTIVE SEED AND SENTINEL LYMPH NODE BIOPSY Left 02/11/2016   Procedure: LEFT BREAST LUMPECTOMY WITH RADIOACTIVE SEED AND LEFT SENTINEL LYMPH NODE BIOPSY;  Surgeon: [name], MD;  Location: [place];  Service: General;  Laterality: Left;  . COLONOSCOPY  10/04   Dr Watt Climes  . COLONOSCOPY  04/07/08   normal - Dr. Watt Climes  . ELBOW SURGERY Left 12/1997  . ESOPHAGOGASTRODUODENOSCOPY ENDOSCOPY  08/2012   [place] hernia with  GERD  . EXCISION OF BREAST BIOPSY Right 10/19/2017   Procedure: EXCISIONAL BIOPSY OF RIGHT BREAST;  Surgeon: [name], MD;  Location: [place];  Service: General;  Laterality: Right;  . FOOT NEUROMA SURGERY Left 2004?  . TONSILLECTOMY AND ADENOIDECTOMY    . TUBAL LIGATION  1981    Current Outpatient Medications  Medication Sig Dispense Refill  . B Complex Vitamins (B COMPLEX-B12 PO) Take by mouth daily.    Marland Kitchen Bioflavonoid Products (C COMPLEX PO) Take by mouth 1 day or 1 dose.    . Calcium Carbonate (CALCIUM 600 PO) Take by mouth daily.    . Carboxymethylcellulose Sod PF (RETAINE CMC) 0.5 % SOLN Apply 1 drop to eye 4 (four) times daily as needed.    . Cholecalciferol (VITAMIN D3) 5000 units CAPS Take 1 capsule by mouth daily.    Marland Kitchen denosumab (PROLIA) 60 MG/ML SOLN injection Inject 60 mg into the skin every 6 (six) months. Administer in upper arm, thigh, or  abdomen    . famotidine (PEPCID AC) 10 MG tablet Take 10 mg by mouth 2 (two) times daily.    . Ferrous Sulfate (IRON) 28 MG TABS Take 28 mg by mouth daily.    . Multiple Vitamin (MULTI-VITAMIN PO) Take by mouth 2 (two) times daily as needed. Women's 50 +    . Multiple Vitamins-Minerals (ICAPS AREDS 2) CAPS Take by mouth 2 (two) times daily.    . RESTASIS 0.05 % ophthalmic emulsion Place 1 drop into both eyes 2 (two) times daily.     Marland Kitchen sulfamethoxazole-trimethoprim (BACTRIM DS) 800-160 MG tablet Take 1 tablet by mouth 2 (two) times daily. Take for one week. 14 tablet 0  . SYNTHROID 100 MCG tablet Take 1 tablet by mouth daily.    Derrill Memo ON 04/15/2018] tamoxifen (NOLVADEX) 20 MG tablet Take 1 tablet (20 mg total) by mouth daily. 90 tablet 3  . valACYclovir (VALTREX) 500 MG tablet Take one tablet a day, can increase 1 tablet BID for 3 days if she has an outbreak. 180 tablet 3   No current facility-administered medications for this visit.      ALLERGIES: Patient has no active allergies.  Family History  Problem Relation Age of Onset  . Heart failure Mother   . Prostate cancer Father 57  . Liver cancer Father   . Colon polyps Sister   . Breast cancer Sister 65  . Breast cancer Sister 51  . Ovarian cancer Cousin        died at 31  . Breast cancer Sister 13       recurrance at 63  . Breast cancer Sister 60  . Colon polyps Sister   . Breast cancer Other        X 4 + age 68, 43 with recurrence, 57, 67  . Lung cancer Brother        smoked  . Breast cancer Paternal Aunt   . Prostate cancer Brother 33  . Melanoma Brother   . Colon cancer Other 65    Social History   Socioeconomic History  . Marital status: Divorced    Spouse name: Not on file  . Number of children: 1  . Years of education: Not on file  . Highest education level: Not on file  Occupational History  . Not on file  Social Needs  . Financial resource strain: Not on file  . Food insecurity:    Worry: Not on file  Inability: Not on file  . Transportation needs:    Medical: Not on file    Non-medical: Not on file  Tobacco Use  . Smoking status: Never Smoker  . Smokeless tobacco: Never Used  Substance and Sexual Activity  . Alcohol use: No  . Drug use: No  . Sexual activity: Not Currently    Partners: Male    Birth control/protection: Post-menopausal  Lifestyle  . Physical activity:    Days per week: Not on file    Minutes per session: Not on file  . Stress: Not on file  Relationships  . Social connections:    Talks on phone: Not on file    Gets together: Not on file    Attends religious service: Not on file    Active member of club or organization: Not on file    Attends meetings of clubs or organizations: Not on file    Relationship status: Not on file  . Intimate partner violence:    Fear of current or ex partner: Not on file    Emotionally abused: Not on file    Physically abused: Not on file    Forced sexual activity: Not on file  Other Topics Concern  . Not on file  Social History Narrative  . Not on file    Review of Systems  Constitutional: Negative.   HENT: Negative.   Eyes: Negative.   Respiratory: Negative.   Cardiovascular: Negative.   Gastrointestinal: Negative.   Genitourinary: Negative.   Musculoskeletal: Negative.   Skin: Negative.   Neurological: Negative.   Endo/Heme/Allergies: Negative.   Psychiatric/Behavioral: Negative.     PHYSICAL EXAMINATION:    BP 128/62 (BP Location: Right Arm, Patient Position: Sitting, Cuff Size: Normal)   Pulse 68   Wt 121 lb (54.9 kg)   LMP 05/16/2000 (Approximate)   BMI 22.13 kg/m     General appearance: alert, cooperative and appears stated age Neck: no adenopathy, supple, symmetrical, trachea midline and thyroid normal to inspection and palpation Heart: regular rate and rhythm Lungs: CTAB Abdomen: soft, non-tender; bowel sounds normal; no masses,  no organomegaly Extremities: normal, atraumatic, no cyanosis Skin:  normal color, texture and turgor, no rashes or lesions Lymph: normal cervical supraclavicular and inguinal nodes Neurologic: grossly normal   Pelvic: External genitalia:  no lesions              Urethra:  normal appearing urethra with no masses, tenderness or lesions              Bartholins and Skenes: normal                 Vagina: normal appearing vagina with normal color and discharge, no lesions              Cervix: no lesions              Sonohysterogram The procedure and risks of the procedure were reviewed with the patient, consent form was signed. A speculum was placed in the vagina and the cervix was cleansed with betadine. The sonohysterogram catheter was inserted into the uterine cavity without difficulty. The speculum was removed. Saline was infused under direct observation with the ultrasound. An endometrial polyp was noted, otherwise thin endometrium.The catheter was removed.    Chaperone was present for exam.  ASSESSMENT Possible Postmenopausal bleeding, endometrial polyp on sonohysterogram H/O breast cancer, on tamoxifen On antibiotics for a UTI, patient thinks she actually passed a kidney stone, symptoms stopped prior to starting antibiotics  PLAN Plan: hysteroscopy, polypectomy, dilation and curettage. Reviewed risks, including, but not limited to: bleeding, infection, uterine perforation    An After Visit Summary was printed and given to the patient.  ~25 minutes face to face time of which over 50% was spent in counseling.   CC: Dr Jana Hakim, Dr Dema Severin

## 2018-03-05 NOTE — Telephone Encounter (Signed)
Spoke with patient. We discussed the reasons for the ultrasound that is ordered for her. She verbalized understanding. She does feel she was having bleeding as it was from a kidney stone and not vaginal bleeding but agrees to evaluation of endometrium as she is on tamoxifen.  Instructions given. She does not have to drink water and hold her bladder prior to appointment.  Encounter closed.

## 2018-03-05 NOTE — Telephone Encounter (Signed)
Spoke with patient in regards to benefits for ultrasound with possible endometrial biopsy. Patient understood benefit information. Patient is scheduled 03/06/18 with Dr Talbert Nan. Patient is aware of appointment date, arrival time and cancellation policy.   Patient request to speak with a nurse in regards to further explanation as to why ultrasound is needed.  Routing to Triage Nurse for review

## 2018-03-06 ENCOUNTER — Ambulatory Visit (INDEPENDENT_AMBULATORY_CARE_PROVIDER_SITE_OTHER): Payer: Medicare Other | Admitting: Obstetrics and Gynecology

## 2018-03-06 ENCOUNTER — Ambulatory Visit (INDEPENDENT_AMBULATORY_CARE_PROVIDER_SITE_OTHER): Payer: Medicare Other

## 2018-03-06 ENCOUNTER — Encounter: Payer: Self-pay | Admitting: Obstetrics and Gynecology

## 2018-03-06 ENCOUNTER — Other Ambulatory Visit: Payer: Self-pay

## 2018-03-06 ENCOUNTER — Other Ambulatory Visit: Payer: Self-pay | Admitting: Obstetrics and Gynecology

## 2018-03-06 VITALS — BP 128/62 | HR 68 | Wt 121.0 lb

## 2018-03-06 DIAGNOSIS — Z79810 Long term (current) use of selective estrogen receptor modulators (SERMs): Secondary | ICD-10-CM | POA: Diagnosis not present

## 2018-03-06 DIAGNOSIS — Z5181 Encounter for therapeutic drug level monitoring: Secondary | ICD-10-CM | POA: Diagnosis not present

## 2018-03-06 DIAGNOSIS — N950 Postmenopausal bleeding: Secondary | ICD-10-CM

## 2018-03-06 DIAGNOSIS — Z9221 Personal history of antineoplastic chemotherapy: Secondary | ICD-10-CM | POA: Diagnosis not present

## 2018-03-06 DIAGNOSIS — R9389 Abnormal findings on diagnostic imaging of other specified body structures: Secondary | ICD-10-CM

## 2018-03-06 DIAGNOSIS — N840 Polyp of corpus uteri: Secondary | ICD-10-CM

## 2018-03-06 DIAGNOSIS — Z9229 Personal history of other drug therapy: Secondary | ICD-10-CM

## 2018-03-06 LAB — CYTOLOGY - PAP: Diagnosis: NEGATIVE

## 2018-03-07 ENCOUNTER — Telehealth: Payer: Self-pay | Admitting: Obstetrics and Gynecology

## 2018-03-07 NOTE — Telephone Encounter (Signed)
Spoke with patient regarding benefit for surgery. Patient understood and agreeable. Patient aware this is professional benefit only. Patient aware will be contacted by hospital for separate benefits. Patient states she will review her schedule and call back to confirm and schedule.   cc: Lamont Snowball, RN

## 2018-03-08 NOTE — Telephone Encounter (Signed)
Call to patient. Reviewed surgery date options. Patient considering dates and will call back with decision.

## 2018-03-08 NOTE — Telephone Encounter (Signed)
Patient returned call. See staff message.

## 2018-03-08 NOTE — Telephone Encounter (Signed)
Patient decided on November 4th for surgery at 10am. Patient stated that she does not want to have surgery earlier than that, but would prefer later, if at all possible.

## 2018-03-09 DIAGNOSIS — Z87442 Personal history of urinary calculi: Secondary | ICD-10-CM

## 2018-03-09 DIAGNOSIS — R319 Hematuria, unspecified: Secondary | ICD-10-CM

## 2018-03-09 HISTORY — DX: Personal history of urinary calculi: Z87.442

## 2018-03-09 HISTORY — DX: Hematuria, unspecified: R31.9

## 2018-03-09 NOTE — Telephone Encounter (Signed)
Surgical case request sent for 03-19-18 at 1015.

## 2018-03-09 NOTE — Telephone Encounter (Signed)
Return call to patient. Advised suregry confirmed for 03-19-18 at 1000 at [facility]. Arrive at 0800 or as directed by facility. Surgery instruction sheet reviewed and printed copy will be mailed to patient.   Routing to provider. Encounter closed.

## 2018-03-09 NOTE — Telephone Encounter (Signed)
Patient calling to check on surgery date for 03/19/18.

## 2018-03-13 ENCOUNTER — Telehealth: Payer: Self-pay | Admitting: Obstetrics and Gynecology

## 2018-03-14 ENCOUNTER — Encounter (HOSPITAL_BASED_OUTPATIENT_CLINIC_OR_DEPARTMENT_OTHER): Payer: Self-pay

## 2018-03-14 ENCOUNTER — Other Ambulatory Visit: Payer: Self-pay

## 2018-03-14 NOTE — Progress Notes (Signed)
No BP/IV left arm Spoke with: Sharlene NPO:  After Midnight, no gum, candy, or mints  Arrival time: 0800AM Labs: Istat 4 AM medications: Famotidine, Synthroid, Eye drops per normal Pre op orders: Yes Ride home:  Paula Dawson (friend) [phone]

## 2018-03-14 NOTE — H&P (Signed)
GYNECOLOGY  VISIT   HPI: 72 y.o.   Divorced White or Caucasian Not Hispanic or Latino  female   [phone] with Patient's last menstrual period was 05/16/2000 (approximate).   here for consult following PUS. She was seen last week with bladder symptoms and reported possible vaginal bleeding. Urine culture was +, patient never got the message but did take the bactrim (still on it). She has excruciating pain for 8 hours, had blood in her urine. Feels she passed a kidney stone. So no bladder symptoms now at all, no frequency, no urgency, no pain, no bleeding.   Pap 03/02/18, results pending  Non smoker, no ETOH.    GYNECOLOGIC HISTORY: Patient's last menstrual period was 05/16/2000 (approximate). Contraception:Postmenopausal          Menopausal hormone therapy: None                OB History    Gravida  2   Para  2   Term  2   Preterm  0   AB  0   Living  1     SAB  0   TAB  0   Ectopic  0   Multiple  0   Live Births  1              Patient Active Problem List   Diagnosis Date Noted  . Adult hypothyroidism 01/01/2018  . Avitaminosis D 01/01/2018  . Dyslipidemia 01/01/2018  . OP (osteoporosis) 01/01/2018  . 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 Genetic testing 03/22/2016  . Family history of breast cancer   . Family history of ovarian cancer   . Family history of prostate cancer   . Breast cancer of upper-outer quadrant of left female breast ([place]) 01/20/2016  . Bronchiectasis without acute exacerbation ([place])  with MPNs 12/11/2015        Past Medical History:  Diagnosis Date  . Anemia   . Arthritis   . BRCA gene mutation negative 03/2016  . Breast cancer ([place])    left 2017  . Breast cancer in female [facility]) 12/2015   left breast cancer, Estrogen negative, progesterone slight positive  . Complication of anesthesia   . Dry eye syndrome   . Early cataract   . Family history of breast cancer   . Family history of  ovarian cancer   . Family history of prostate cancer   . Gall stones   . GERD (gastroesophageal reflux disease)   . History of hiatal hernia   . HSV infection    pos I and II  . Kidney stones   . Macular degeneration of both eyes   . Osteopenia   . Osteoporosis 1998   Off Fosomax 2008, Prolia 03/17/11, 09/14/11, 03/16/12  . Personal history of radiation therapy   . PONV (postoperative nausea and vomiting)   . Thyroid disease age 68   hypo  . Wears glasses          Past Surgical History:  Procedure Laterality Date  . AUGMENTATION MAMMAPLASTY  1981  . BREAST IMPLANT REMOVAL  1982  . BREAST LUMPECTOMY     left 2017  . BREAST LUMPECTOMY WITH RADIOACTIVE SEED AND SENTINEL LYMPH NODE BIOPSY Left 02/11/2016   Procedure: LEFT BREAST LUMPECTOMY WITH RADIOACTIVE SEED AND LEFT SENTINEL LYMPH NODE BIOPSY;  Surgeon: [name], MD;  Location: [place];  Service: General;  Laterality: Left;  . COLONOSCOPY  10/04   Dr Watt Climes  . COLONOSCOPY  04/07/08   normal - Dr. Watt Climes  . ELBOW SURGERY Left 12/1997  . ESOPHAGOGASTRODUODENOSCOPY ENDOSCOPY  08/2012   [place] hernia with GERD  . EXCISION OF BREAST BIOPSY Right 10/19/2017   Procedure: EXCISIONAL BIOPSY OF RIGHT BREAST;  Surgeon: [name], MD;  Location: [place];  Service: General;  Laterality: Right;  . FOOT NEUROMA SURGERY Left 2004?  . TONSILLECTOMY AND ADENOIDECTOMY    . TUBAL LIGATION  1981          Current Outpatient Medications  Medication Sig Dispense Refill  . B Complex Vitamins (B COMPLEX-B12 PO) Take by mouth daily.    Marland Kitchen Bioflavonoid Products (C COMPLEX PO) Take by mouth 1 day or 1 dose.    . Calcium Carbonate (CALCIUM 600 PO) Take by mouth daily.    . Carboxymethylcellulose Sod PF (RETAINE CMC) 0.5 % SOLN Apply 1 drop to eye 4 (four) times daily as needed.    . Cholecalciferol (VITAMIN D3) 5000 units CAPS Take 1 capsule by mouth daily.    Marland Kitchen denosumab (PROLIA) 60 MG/ML  SOLN injection Inject 60 mg into the skin every 6 (six) months. Administer in upper arm, thigh, or abdomen    . famotidine (PEPCID AC) 10 MG tablet Take 10 mg by mouth 2 (two) times daily.    . Ferrous Sulfate (IRON) 28 MG TABS Take 28 mg by mouth daily.    . Multiple Vitamin (MULTI-VITAMIN PO) Take by mouth 2 (two) times daily as needed. Women's 50 +    . Multiple Vitamins-Minerals (ICAPS AREDS 2) CAPS Take by mouth 2 (two) times daily.    . RESTASIS 0.05 % ophthalmic emulsion Place 1 drop into both eyes 2 (two) times daily.     Marland Kitchen sulfamethoxazole-trimethoprim (BACTRIM DS) 800-160 MG tablet Take 1 tablet by mouth 2 (two) times daily. Take for one week. 14 tablet 0  . SYNTHROID 100 MCG tablet Take 1 tablet by mouth daily.    Derrill Memo ON 04/15/2018] tamoxifen (NOLVADEX) 20 MG tablet Take 1 tablet (20 mg total) by mouth daily. 90 tablet 3  . valACYclovir (VALTREX) 500 MG tablet Take one tablet a day, can increase 1 tablet BID for 3 days if she has an outbreak. 180 tablet 3   No current facility-administered medications for this visit.      ALLERGIES: Patient has no active allergies.       Family History  Problem Relation Age of Onset  . Heart failure Mother   . Prostate cancer Father 69  . Liver cancer Father   . Colon polyps Sister   . Breast cancer Sister 67  . Breast cancer Sister 76  . Ovarian cancer Cousin        died at 19  . Breast cancer Sister 68       recurrance at 69  . Breast cancer Sister 47  . Colon polyps Sister   . Breast cancer Other        X 4 + age 79, 107 with recurrence, 71, 58  . Lung cancer Brother        smoked  . Breast cancer Paternal Aunt   . Prostate cancer Brother 26  . Melanoma Brother   . Colon cancer Other 61    Social History        Socioeconomic History  . Marital status: Divorced    Spouse name: Not on file  . Number of children: 1  . Years of education: Not on file  .  Highest education level: Not on  file  Occupational History  . Not on file  Social Needs  . Financial resource strain: Not on file  . Food insecurity:    Worry: Not on file    Inability: Not on file  . Transportation needs:    Medical: Not on file    Non-medical: Not on file  Tobacco Use  . Smoking status: Never Smoker  . Smokeless tobacco: Never Used  Substance and Sexual Activity  . Alcohol use: No  . Drug use: No  . Sexual activity: Not Currently    Partners: Male    Birth control/protection: Post-menopausal  Lifestyle  . Physical activity:    Days per week: Not on file    Minutes per session: Not on file  . Stress: Not on file  Relationships  . Social connections:    Talks on phone: Not on file    Gets together: Not on file    Attends religious service: Not on file    Active member of club or organization: Not on file    Attends meetings of clubs or organizations: Not on file    Relationship status: Not on file  . Intimate partner violence:    Fear of current or ex partner: Not on file    Emotionally abused: Not on file    Physically abused: Not on file    Forced sexual activity: Not on file  Other Topics Concern  . Not on file  Social History Narrative  . Not on file    Review of Systems  Constitutional: Negative.   HENT: Negative.   Eyes: Negative.   Respiratory: Negative.   Cardiovascular: Negative.   Gastrointestinal: Negative.   Genitourinary: Negative.   Musculoskeletal: Negative.   Skin: Negative.   Neurological: Negative.   Endo/Heme/Allergies: Negative.   Psychiatric/Behavioral: Negative.     PHYSICAL EXAMINATION:    BP 128/62 (BP Location: Right Arm, Patient Position: Sitting, Cuff Size: Normal)   Pulse 68   Wt 121 lb (54.9 kg)   LMP 05/16/2000 (Approximate)   BMI 22.13 kg/m     General appearance: alert, cooperative and appears stated age Neck: no adenopathy, supple, symmetrical, trachea midline and thyroid normal to  inspection and palpation Heart: regular rate and rhythm Lungs: CTAB Abdomen: soft, non-tender; bowel sounds normal; no masses,  no organomegaly Extremities: normal, atraumatic, no cyanosis Skin: normal color, texture and turgor, no rashes or lesions Lymph: normal cervical supraclavicular and inguinal nodes Neurologic: grossly normal   Pelvic: External genitalia:  no lesions              Urethra:  normal appearing urethra with no masses, tenderness or lesions              Bartholins and Skenes: normal                 Vagina: normal appearing vagina with normal color and discharge, no lesions              Cervix: no lesions              Sonohysterogram The procedure and risks of the procedure were reviewed with the patient, consent form was signed. A speculum was placed in the vagina and the cervix was cleansed with betadine. The sonohysterogram catheter was inserted into the uterine cavity without difficulty. The speculum was removed. Saline was infused under direct observation with the ultrasound. An endometrial polyp was noted, otherwise thin endometrium.The catheter was removed.  Chaperone was present for exam.  ASSESSMENT Possible Postmenopausal bleeding, endometrial polyp on sonohysterogram H/O breast cancer, on tamoxifen On antibiotics for a UTI, patient thinks she actually passed a kidney stone, symptoms stopped prior to starting antibiotics    PLAN Plan: hysteroscopy, polypectomy, dilation and curettage. Reviewed risks, including, but not limited to: bleeding, infection, uterine perforation    An After Visit Summary was printed and given to the patient.  ~25 minutes face to face time of which over 50% was spent in counseling.

## 2018-03-19 ENCOUNTER — Encounter (HOSPITAL_BASED_OUTPATIENT_CLINIC_OR_DEPARTMENT_OTHER)
Admission: RE | Disposition: A | Discharge status: Home / Self Care | Payer: Self-pay | Source: Ambulatory Visit | Attending: Obstetrics and Gynecology

## 2018-03-19 ENCOUNTER — Ambulatory Visit (HOSPITAL_BASED_OUTPATIENT_CLINIC_OR_DEPARTMENT_OTHER)
Admission: RE | Admit: 2018-03-19 | Discharge: 2018-03-19 | Disposition: A | Discharge status: Home / Self Care | Payer: Medicare Other | Source: Ambulatory Visit | Attending: Obstetrics and Gynecology | Admitting: Obstetrics and Gynecology

## 2018-03-19 ENCOUNTER — Ambulatory Visit (HOSPITAL_BASED_OUTPATIENT_CLINIC_OR_DEPARTMENT_OTHER): Payer: Medicare Other | Admitting: Certified Registered Nurse Anesthetist

## 2018-03-19 ENCOUNTER — Encounter (HOSPITAL_BASED_OUTPATIENT_CLINIC_OR_DEPARTMENT_OTHER): Payer: Self-pay | Admitting: Certified Registered Nurse Anesthetist

## 2018-03-19 DIAGNOSIS — E559 Vitamin D deficiency, unspecified: Secondary | ICD-10-CM | POA: Insufficient documentation

## 2018-03-19 DIAGNOSIS — Z79890 Hormone replacement therapy (postmenopausal): Secondary | ICD-10-CM | POA: Insufficient documentation

## 2018-03-19 DIAGNOSIS — N950 Postmenopausal bleeding: Secondary | ICD-10-CM | POA: Insufficient documentation

## 2018-03-19 DIAGNOSIS — M810 Age-related osteoporosis without current pathological fracture: Secondary | ICD-10-CM | POA: Insufficient documentation

## 2018-03-19 DIAGNOSIS — B009 Herpesviral infection, unspecified: Secondary | ICD-10-CM | POA: Insufficient documentation

## 2018-03-19 DIAGNOSIS — K219 Gastro-esophageal reflux disease without esophagitis: Secondary | ICD-10-CM | POA: Diagnosis not present

## 2018-03-19 DIAGNOSIS — Z79810 Long term (current) use of selective estrogen receptor modulators (SERMs): Secondary | ICD-10-CM | POA: Diagnosis not present

## 2018-03-19 DIAGNOSIS — Z79899 Other long term (current) drug therapy: Secondary | ICD-10-CM | POA: Diagnosis not present

## 2018-03-19 DIAGNOSIS — M1990 Unspecified osteoarthritis, unspecified site: Secondary | ICD-10-CM | POA: Insufficient documentation

## 2018-03-19 DIAGNOSIS — N840 Polyp of corpus uteri: Secondary | ICD-10-CM | POA: Diagnosis not present

## 2018-03-19 DIAGNOSIS — Z803 Family history of malignant neoplasm of breast: Secondary | ICD-10-CM | POA: Insufficient documentation

## 2018-03-19 DIAGNOSIS — N390 Urinary tract infection, site not specified: Secondary | ICD-10-CM | POA: Diagnosis not present

## 2018-03-19 DIAGNOSIS — H3530 Unspecified macular degeneration: Secondary | ICD-10-CM | POA: Diagnosis not present

## 2018-03-19 DIAGNOSIS — D649 Anemia, unspecified: Secondary | ICD-10-CM | POA: Diagnosis not present

## 2018-03-19 DIAGNOSIS — E039 Hypothyroidism, unspecified: Secondary | ICD-10-CM | POA: Diagnosis not present

## 2018-03-19 DIAGNOSIS — H04129 Dry eye syndrome of unspecified lacrimal gland: Secondary | ICD-10-CM | POA: Insufficient documentation

## 2018-03-19 DIAGNOSIS — Z853 Personal history of malignant neoplasm of breast: Secondary | ICD-10-CM | POA: Insufficient documentation

## 2018-03-19 DIAGNOSIS — Z923 Personal history of irradiation: Secondary | ICD-10-CM | POA: Diagnosis not present

## 2018-03-19 HISTORY — DX: Solitary pulmonary nodule: R91.1

## 2018-03-19 HISTORY — DX: Hyperlipidemia, unspecified: E78.5

## 2018-03-19 HISTORY — DX: Personal history of urinary calculi: Z87.442

## 2018-03-19 HISTORY — DX: Polyp of corpus uteri: N84.0

## 2018-03-19 HISTORY — DX: Other hemorrhoids: K64.8

## 2018-03-19 HISTORY — DX: Chronic cough: R05.3

## 2018-03-19 HISTORY — PX: DILATATION & CURETTAGE/HYSTEROSCOPY WITH MYOSURE: SHX6511

## 2018-03-19 HISTORY — DX: Hypothyroidism, unspecified: E03.9

## 2018-03-19 HISTORY — DX: Bronchiectasis, uncomplicated: J47.9

## 2018-03-19 HISTORY — DX: Lymphedema, not elsewhere classified: I89.0

## 2018-03-19 HISTORY — DX: Dyspnea, unspecified: R06.00

## 2018-03-19 HISTORY — DX: Impingement syndrome of right shoulder: M75.41

## 2018-03-19 HISTORY — DX: Vitamin D deficiency, unspecified: E55.9

## 2018-03-19 HISTORY — DX: Personal history of other diseases of the respiratory system: Z87.09

## 2018-03-19 HISTORY — DX: Atherosclerosis of aorta: I70.0

## 2018-03-19 HISTORY — DX: Postmenopausal bleeding: N95.0

## 2018-03-19 HISTORY — DX: Residual hemorrhoidal skin tags: K64.4

## 2018-03-19 HISTORY — DX: Hematuria, unspecified: R31.9

## 2018-03-19 HISTORY — DX: Cough: R05

## 2018-03-19 LAB — POCT I-STAT 4, (NA,K, GLUC, HGB,HCT)
GLUCOSE: 96 mg/dL (ref 70–99)
HEMATOCRIT: 40 % (ref 36.0–46.0)
HEMOGLOBIN: 13.6 g/dL (ref 12.0–15.0)
POTASSIUM: 3.7 mmol/L (ref 3.5–5.1)
Sodium: 143 mmol/L (ref 135–145)

## 2018-03-19 SURGERY — DILATATION & CURETTAGE/HYSTEROSCOPY WITH MYOSURE
Anesthesia: General | Site: Vagina

## 2018-03-19 MED ORDER — FENTANYL CITRATE (PF) 100 MCG/2ML IJ SOLN
INTRAMUSCULAR | Status: DC | PRN
  Administered 2018-03-19 (×2): 25 ug via INTRAVENOUS

## 2018-03-19 MED ORDER — SODIUM CHLORIDE 0.9 % IR SOLN
Status: DC | PRN
  Administered 2018-03-19: 3000 mL

## 2018-03-19 MED ORDER — DEXAMETHASONE SODIUM PHOSPHATE 10 MG/ML IJ SOLN
INTRAMUSCULAR | Status: AC
  Filled 2018-03-19: qty 1

## 2018-03-19 MED ORDER — ONDANSETRON HCL 4 MG/2ML IJ SOLN
INTRAMUSCULAR | Status: AC
  Filled 2018-03-19: qty 2

## 2018-03-19 MED ORDER — LIDOCAINE 2% (20 MG/ML) 5 ML SYRINGE
INTRAMUSCULAR | Status: DC | PRN
  Administered 2018-03-19: 60 mg via INTRAVENOUS

## 2018-03-19 MED ORDER — LIDOCAINE 2% (20 MG/ML) 5 ML SYRINGE
INTRAMUSCULAR | Status: AC
  Filled 2018-03-19: qty 5

## 2018-03-19 MED ORDER — ONDANSETRON HCL 4 MG/2ML IJ SOLN
4.0000 mg | Freq: Once | INTRAMUSCULAR | Status: DC | PRN
  Filled 2018-03-19: qty 2

## 2018-03-19 MED ORDER — ACETAMINOPHEN 500 MG PO TABS
ORAL_TABLET | ORAL | Status: AC
  Filled 2018-03-19: qty 2

## 2018-03-19 MED ORDER — FENTANYL CITRATE (PF) 100 MCG/2ML IJ SOLN
25.0000 ug | INTRAMUSCULAR | Status: DC | PRN
  Filled 2018-03-19: qty 1

## 2018-03-19 MED ORDER — FENTANYL CITRATE (PF) 100 MCG/2ML IJ SOLN
INTRAMUSCULAR | Status: AC
  Filled 2018-03-19: qty 2

## 2018-03-19 MED ORDER — KETOROLAC TROMETHAMINE 15 MG/ML IJ SOLN
INTRAMUSCULAR | Status: DC | PRN
  Administered 2018-03-19: 15 mg via INTRAVENOUS

## 2018-03-19 MED ORDER — ONDANSETRON HCL 4 MG/2ML IJ SOLN
INTRAMUSCULAR | Status: DC | PRN
  Administered 2018-03-19: 4 mg via INTRAVENOUS

## 2018-03-19 MED ORDER — LACTATED RINGERS IV SOLN
INTRAVENOUS | Status: DC
  Administered 2018-03-19: 09:00:00 via INTRAVENOUS
  Filled 2018-03-19: qty 1000

## 2018-03-19 MED ORDER — PROPOFOL 10 MG/ML IV BOLUS
INTRAVENOUS | Status: DC | PRN
  Administered 2018-03-19: 130 mg via INTRAVENOUS

## 2018-03-19 MED ORDER — KETOROLAC TROMETHAMINE 30 MG/ML IJ SOLN
INTRAMUSCULAR | Status: AC
  Filled 2018-03-19: qty 1

## 2018-03-19 MED ORDER — ACETAMINOPHEN 500 MG PO TABS
1000.0000 mg | ORAL_TABLET | Freq: Once | ORAL | Status: AC
  Administered 2018-03-19: 1000 mg via ORAL
  Filled 2018-03-19: qty 2

## 2018-03-19 MED ORDER — DEXAMETHASONE SODIUM PHOSPHATE 10 MG/ML IJ SOLN
INTRAMUSCULAR | Status: DC | PRN
  Administered 2018-03-19: 10 mg via INTRAVENOUS

## 2018-03-19 SURGICAL SUPPLY — 22 items
CANISTER SUCT 3000ML PPV (MISCELLANEOUS) ×6 IMPLANT
CATH ROBINSON RED A/P 16FR (CATHETERS) IMPLANT
COVER WAND RF STERILE (DRAPES) ×3 IMPLANT
DEVICE MYOSURE LITE (MISCELLANEOUS) IMPLANT
DEVICE MYOSURE REACH (MISCELLANEOUS) IMPLANT
DILATOR CANAL MILEX (MISCELLANEOUS) IMPLANT
GAUZE 4X4 16PLY RFD (DISPOSABLE) ×3 IMPLANT
GLOVE BIO SURGEON STRL SZ 6.5 (GLOVE) ×2 IMPLANT
GLOVE BIO SURGEONS STRL SZ 6.5 (GLOVE) ×1
GOWN STRL REUS W/TWL LRG LVL3 (GOWN DISPOSABLE) ×3 IMPLANT
IV NS IRRIG 3000ML ARTHROMATIC (IV SOLUTION) ×3 IMPLANT
KIT PROCEDURE FLUENT (KITS) ×3 IMPLANT
KIT TURNOVER CYSTO (KITS) ×3 IMPLANT
MYOSURE XL FIBROID (MISCELLANEOUS)
PACK VAGINAL MINOR WOMEN LF (CUSTOM PROCEDURE TRAY) ×3 IMPLANT
PAD OB MATERNITY 4.3X12.25 (PERSONAL CARE ITEMS) ×3 IMPLANT
PAD PREP 24X48 CUFFED NSTRL (MISCELLANEOUS) ×3 IMPLANT
SEAL ROD LENS SCOPE MYOSURE (ABLATOR) ×3 IMPLANT
SYR 20CC LL (SYRINGE) IMPLANT
SYSTEM TISS REMOVAL MYOSURE XL (MISCELLANEOUS) IMPLANT
TOWEL OR 17X24 6PK STRL BLUE (TOWEL DISPOSABLE) ×6 IMPLANT
WATER STERILE IRR 500ML POUR (IV SOLUTION) IMPLANT

## 2018-03-19 NOTE — Anesthesia Preprocedure Evaluation (Addendum)
Anesthesia Evaluation  Patient identified by MRN, date of birth, ID band Patient awake    Reviewed: Allergy & Precautions, NPO status , Patient's Chart, lab work & pertinent test results  History of Anesthesia Complications (+) PONV and history of anesthetic complications  Airway Mallampati: II  TM Distance: >3 FB Neck ROM: Full    Dental  (+) Teeth Intact, Dental Advisory Given Permanent bridge, upper and lower back left :   Pulmonary neg pulmonary ROS,    Pulmonary exam normal breath sounds clear to auscultation       Cardiovascular Exercise Tolerance: Good negative cardio ROS Normal cardiovascular exam Rhythm:Regular Rate:Normal     Neuro/Psych negative neurological ROS  negative psych ROS   GI/Hepatic Neg liver ROS, hiatal hernia, GERD  Medicated and Controlled,  Endo/Other  Hypothyroidism   Renal/GU negative Renal ROS     Musculoskeletal  (+) Arthritis ,   Abdominal   Peds  Hematology negative hematology ROS (+)   Anesthesia Other Findings Day of surgery medications reviewed with the patient.  Left breast cancer, dry eye syndrome, macular degeneration  Reproductive/Obstetrics                            Anesthesia Physical Anesthesia Plan  ASA: II  Anesthesia Plan: General   Post-op Pain Management:    Induction: Intravenous  PONV Risk Score and Plan: 4 or greater and Treatment may vary due to age or medical condition, Dexamethasone and Ondansetron  Airway Management Planned: LMA  Additional Equipment:   Intra-op Plan:   Post-operative Plan: Extubation in OR  Informed Consent: I have reviewed the patients History and Physical, chart, labs and discussed the procedure including the risks, benefits and alternatives for the proposed anesthesia with the patient or authorized representative who has indicated his/her understanding and acceptance.   Dental advisory  given  Plan Discussed with: CRNA  Anesthesia Plan Comments:         Anesthesia Quick Evaluation

## 2018-03-19 NOTE — Discharge Instructions (Signed)
NO ADVIL, ALEVE, MOTRIN, IBUPROFEN UNTIL 4 PM TODAY   Post Anesthesia Home Care Instructions  Activity: Get plenty of rest for the remainder of the day. A responsible individual must stay with you for 24 hours following the procedure.  For the next 24 hours, DO NOT: -Drive a car -Paediatric nurse -Drink alcoholic beverages -Take any medication unless instructed by your physician -Make any legal decisions or sign important papers.  Meals: Start with liquid foods such as gelatin or soup. Progress to regular foods as tolerated. Avoid greasy, spicy, heavy foods. If nausea and/or vomiting occur, drink only clear liquids until the nausea and/or vomiting subsides. Call your physician if vomiting continues.  Special Instructions/Symptoms: Your throat may feel dry or sore from the anesthesia or the breathing tube placed in your throat during surgery. If this causes discomfort, gargle with warm salt water. The discomfort should disappear within 24 hours.  If you had a scopolamine patch placed behind your ear for the management of post- operative nausea and/or vomiting:  1. The medication in the patch is effective for 72 hours, after which it should be removed.  Wrap patch in a tissue and discard in the trash. Wash hands thoroughly with soap and water. 2. You may remove the patch earlier than 72 hours if you experience unpleasant side effects which may include dry mouth, dizziness or visual disturbances. 3. Avoid touching the patch. Wash your hands with soap and water after contact with the patch.

## 2018-03-19 NOTE — Anesthesia Postprocedure Evaluation (Signed)
Anesthesia Post Note  Patient: Paula Dawson  Procedure(s) Performed: DILATATION & CURETTAGE/HYSTEROSCOPY (N/A Vagina )     Patient location during evaluation: PACU Anesthesia Type: General Level of consciousness: awake and alert, oriented and awake Pain management: pain level controlled Vital Signs Assessment: post-procedure vital signs reviewed and stable Respiratory status: spontaneous breathing, nonlabored ventilation, respiratory function stable and patient connected to nasal cannula oxygen Cardiovascular status: blood pressure returned to baseline and stable Postop Assessment: no apparent nausea or vomiting Anesthetic complications: no    Last Vitals:  Vitals:   03/19/18 1030 03/19/18 1141  BP: 124/68 126/72  Pulse: 63 64  Resp: 20 12  Temp:  36.7 C  SpO2: 99% 98%    Last Pain:  Vitals:   03/19/18 1141  TempSrc:   PainSc: 0-No pain                 Catalina Gravel

## 2018-03-19 NOTE — Op Note (Signed)
Preoperative Diagnosis: postmenopausal bleeding, endometrial polyp  Postoperative Diagnosis: same  Procedure: Hysteroscopy, polypectomy, dilation and curettage  Surgeon: Dr Sumner Boast  Assistants: None  Anesthesia: General via LMA  EBL: minimal  Fluids: 500 cc  Fluid deficit: 70 cc  Urine output: not recorded  Indications for surgery: The patient is a 72 yo female, who presented with possible postmenopausal bleeding. Ultrasound revealed a thickened endometrial stripe and sonohysterogram revealed an endometrial polyp on the anterior uterine wall. The risks of the surgery were reviewed with the patient and the consent form was signed prior to her surgery.  Findings: EUA: normal sized, mobile uterus, no adnexal masses. Hysteroscopy: no clear polyp was seen, thickening noted anteriorly. Normal tubal ostia bilaterally. With curettage, an irregularity was noted anteriorly an a polyp was removed with the curette. Post curettage repeat hysteroscopy revealed an empty cavity.   Specimens: endometrial polyp, endometrial curettings   Procedure: The patient was taken to the operating room with an IV in place. She was placed in the dorsal lithotomy position and anesthesia was administered. She was prepped and draped in the usual sterile fashion for a vaginal procedure. She voided on the way to the OR. A weighted speculum was placed in the vagina and a single tooth tenaculum was placed on the anterior lip of the cervix. The cervix was dilated to a #18 pratt dilator. The uterus was sounded to 7-8 cm. The myosure hysteroscope was inserted into the uterine cavity. With continuous infusion of normal saline, the uterine cavity was visualized with the above findings. The myosure was then removed. The cavity was then curetted with the small sharp curette. The polyp was removed with the curette. The cavity had the characteristically gritty texture at the end of the procedure. The curette was removed and the  hysteroscope was reinserted into the endometrial cavity and the cavity was empty. The hysteroscope and the tenaculum were removed. Oozing from the tenaculum site was stopped with pressure. The speculum was removed. The patients perineum was cleansed of betadine and she was taken out of the dorsal lithotomy position.  Upon awakening the LMA was removed and the patient was transferred to the recovery room in stable and awake condition.  The sponge and instrument count were correct. There were no complications.    CC: Dr Jana Hakim, Dr Dema Severin

## 2018-03-19 NOTE — Anesthesia Procedure Notes (Signed)
Procedure Name: LMA Insertion Date/Time: 03/19/2018 9:40 AM Performed by: Genelle Bal, CRNA Pre-anesthesia Checklist: Patient identified, Emergency Drugs available, Suction available and Patient being monitored Patient Re-evaluated:Patient Re-evaluated prior to induction Oxygen Delivery Method: Circle system utilized Preoxygenation: Pre-oxygenation with 100% oxygen Induction Type: IV induction Ventilation: Mask ventilation without difficulty LMA: LMA inserted LMA Size: 4.0 Number of attempts: 1 Airway Equipment and Method: Bite block Placement Confirmation: positive ETCO2 Tube secured with: Tape Dental Injury: Teeth and Oropharynx as per pre-operative assessment

## 2018-03-19 NOTE — Interval H&P Note (Signed)
History and Physical Interval Note:  03/19/2018 9:32 AM  Paula Dawson  has presented today for surgery, with the diagnosis of PMB, endometrial polyp  The various methods of treatment have been discussed with the patient and family. After consideration of risks, benefits and other options for treatment, the patient has consented to  Procedure(s) with comments: [place] (N/A) - follow 1st case as a surgical intervention .  The patient's history has been reviewed, patient examined, no change in status, stable for surgery.  I have reviewed the patient's chart and labs.  Questions were answered to the patient's satisfaction.     Salvadore Dom

## 2018-03-19 NOTE — Transfer of Care (Signed)
Immediate Anesthesia Transfer of Care Note  Patient: Paula Dawson  Procedure(s) Performed: DILATATION & CURETTAGE/HYSTEROSCOPY (N/A Vagina )  Patient Location: PACU  Anesthesia Type:General  Level of Consciousness: awake, alert  and oriented  Airway & Oxygen Therapy: Patient Spontanous Breathing and Patient connected to nasal cannula oxygen  Post-op Assessment: Report given to RN and Post -op Vital signs reviewed and stable  Post vital signs: Reviewed and stable  Last Vitals:  Vitals Value Taken Time  BP 128/77 03/19/2018 10:07 AM  Temp    Pulse 65 03/19/2018 10:08 AM  Resp 17 03/19/2018 10:08 AM  SpO2 98 % 03/19/2018 10:08 AM  Vitals shown include unvalidated device data.  Last Pain:  Vitals:   03/19/18 0848  TempSrc:   PainSc: 0-No pain      Patients Stated Pain Goal: 8 (26/41/58 3094)  Complications: No apparent anesthesia complications

## 2018-03-20 ENCOUNTER — Encounter (HOSPITAL_BASED_OUTPATIENT_CLINIC_OR_DEPARTMENT_OTHER): Payer: Self-pay | Admitting: Obstetrics and Gynecology

## 2018-04-02 ENCOUNTER — Ambulatory Visit (INDEPENDENT_AMBULATORY_CARE_PROVIDER_SITE_OTHER): Payer: Medicare Other | Admitting: Obstetrics and Gynecology

## 2018-04-02 ENCOUNTER — Other Ambulatory Visit: Payer: Self-pay

## 2018-04-02 ENCOUNTER — Encounter: Payer: Self-pay | Admitting: Obstetrics and Gynecology

## 2018-04-02 ENCOUNTER — Telehealth: Payer: Self-pay

## 2018-04-02 ENCOUNTER — Telehealth: Payer: Self-pay | Admitting: Obstetrics and Gynecology

## 2018-04-02 VITALS — BP 128/70 | HR 80 | Wt 121.6 lb

## 2018-04-02 DIAGNOSIS — K649 Unspecified hemorrhoids: Secondary | ICD-10-CM | POA: Diagnosis not present

## 2018-04-02 DIAGNOSIS — Z9889 Other specified postprocedural states: Secondary | ICD-10-CM

## 2018-04-02 MED ORDER — LIDOCAINE-HYDROCORTISONE ACE 1-1 % EX CREA: 1.0000 | TOPICAL_CREAM | Freq: Two times a day (BID) | CUTANEOUS | 0 refills | Status: DC | PRN

## 2018-04-02 MED ORDER — LIDOCAINE-PRILOCAINE 2.5-2.5 % EX CREA: 1.0000 "application " | TOPICAL_CREAM | Freq: Two times a day (BID) | CUTANEOUS | 0 refills | Status: DC | PRN

## 2018-04-02 NOTE — Progress Notes (Signed)
GYNECOLOGY  VISIT   HPI: 72 y.o.   Divorced White or Caucasian Not Hispanic or Latino  female   [phone] with Patient's last menstrual period was 05/16/2000 (approximate).   here for 2 week post op. S/p hysteroscopy, polypectomy, D&C. Pathology was benign.  No c/o after her surgery. Reports having a hemorrhoid that is painful.   She woke up Friday night 4 x with the urge to have a BM, not diarrhea, slight strain, small BM each time. Saturday am she had diarrhea. Saturday she felt anal soreness. At the entrance to her rectum she notices a marble sized tender lump. Hurts to sit down.   GYNECOLOGIC HISTORY: Patient's last menstrual period was 05/16/2000 (approximate). Contraception: Post menopausal Menopausal hormone therapy: None        OB History    Gravida  2   Para  2   Term  2   Preterm  0   AB  0   Living  1     SAB  0   TAB  0   Ectopic  0   Multiple  0   Live Births  1              Patient Active Problem List   Diagnosis Date Noted  . Adult hypothyroidism 01/01/2018  . Avitaminosis D 01/01/2018  . Dyslipidemia 01/01/2018  . OP (osteoporosis) 01/01/2018  . 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 Genetic testing 03/22/2016  . Family history of breast cancer   . Family history of ovarian cancer   . Family history of prostate cancer   . Breast cancer of upper-outer quadrant of left female breast ([place]) 01/20/2016  . Bronchiectasis without acute exacerbation ([place])  with MPNs 12/11/2015    Past Medical History:  Diagnosis Date  . Anemia   . Aortic atherosclerosis ([place]) 12/23/2016   Noted on CT  . Arthritis   . BRCA gene mutation negative 03/2016  . Breast cancer in female [facility]) 12/2015   left breast cancer, Estrogen negative, progesterone slight positive  . Bronchiectasis ([place])   . Complication of anesthesia   . Dry eye syndrome   . Dyslipidemia   . Dyspnea   . Early cataract   . Endometrial polyp   . Family history of breast cancer    . Family history of ovarian cancer   . Family history of prostate cancer   . Gall stones   . GERD (gastroesophageal reflux disease)   . Hematuria 03/09/2018  . History of bronchitis   . History of hiatal hernia   . History of kidney stones 03/09/2018  . HSV infection    pos I and II  . Hypothyroidism   . Internal and external hemorrhoids without complication 78/67/6720   noted on colonoscopy   . Lymphedema    Left breast  . Macular degeneration of both eyes   . Osteopenia   . Osteoporosis 1998   Off Fosomax 2008, Prolia 03/17/11, 09/14/11, 03/16/12  . Persistent dry cough   . Personal history of radiation therapy   . PMB (postmenopausal bleeding)   . PONV (postoperative nausea and vomiting)   . Pulmonary nodule 11/13/2015   10 to 11 mm subpleural pulmonary nodules in the right middle and right upper lobes   . Shoulder impingement, right 11/22/2016  . Vitamin D deficiency   . Wears glasses     Past Surgical History:  Procedure Laterality Date  . AUGMENTATION MAMMAPLASTY  1981  . BREAST IMPLANT  REMOVAL  1982  . BREAST LUMPECTOMY WITH RADIOACTIVE SEED AND SENTINEL LYMPH NODE BIOPSY Left 02/11/2016   Procedure: LEFT BREAST LUMPECTOMY WITH RADIOACTIVE SEED AND LEFT SENTINEL LYMPH NODE BIOPSY;  Surgeon: [name], MD;  Location: [place];  Service: General;  Laterality: Left;  . COLONOSCOPY  10/04   Dr Watt Climes  . COLONOSCOPY  04/07/08   normal - Dr. Watt Climes  . COLONOSCOPY  04/08/2013  . DILATATION & CURETTAGE/HYSTEROSCOPY WITH MYOSURE N/A 03/19/2018   Procedure: DILATATION & CURETTAGE/HYSTEROSCOPY;  Surgeon: Salvadore Dom, MD;  Location[facility];  Service: Gynecology;  Laterality: N/A;  . ELBOW SURGERY Right 12/1997  . ESOPHAGOGASTRODUODENOSCOPY ENDOSCOPY  08/2012   [place] hernia with GERD  . EXCISION OF BREAST BIOPSY Right 10/19/2017   Procedure: EXCISIONAL BIOPSY OF RIGHT BREAST;  Surgeon: [name], MD;  Location: [place];  Service:  General;  Laterality: Right;  . FOOT NEUROMA SURGERY Left 2004?  . TONSILLECTOMY AND ADENOIDECTOMY    . TUBAL LIGATION  1981    Current Outpatient Medications  Medication Sig Dispense Refill  . B Complex Vitamins (B COMPLEX-B12 PO) Take by mouth daily.    Marland Kitchen Bioflavonoid Products (C COMPLEX PO) Take by mouth 1 day or 1 dose.    . Calcium Carbonate (CALCIUM 600 PO) Take by mouth daily.    . Carboxymethylcellulose Sod PF (RETAINE CMC) 0.5 % SOLN Apply 1 drop to eye 4 (four) times daily as needed.    . Cholecalciferol (VITAMIN D3) 5000 units CAPS Take 1 capsule by mouth daily.    Marland Kitchen denosumab (PROLIA) 60 MG/ML SOLN injection Inject 60 mg into the skin every 6 (six) months. Administer in upper arm, thigh, or abdomen    . famotidine (PEPCID AC) 10 MG tablet Take 10 mg by mouth 2 (two) times daily.    . Ferrous Sulfate (IRON) 28 MG TABS Take 28 mg by mouth daily. Mon, Wed, and Fridays only    . Multiple Vitamin (MULTI-VITAMIN PO) Take by mouth 2 (two) times daily as needed. Women's 50 +    . Multiple Vitamins-Minerals (ICAPS AREDS 2) CAPS Take by mouth 2 (two) times daily.    . RESTASIS 0.05 % ophthalmic emulsion Place 1 drop into both eyes 2 (two) times daily.     Marland Kitchen SYNTHROID 100 MCG tablet Take 1 tablet by mouth daily.    Derrill Memo ON 04/15/2018] tamoxifen (NOLVADEX) 20 MG tablet Take 1 tablet (20 mg total) by mouth daily. 90 tablet 3  . valACYclovir (VALTREX) 500 MG tablet Take one tablet a day, can increase 1 tablet BID for 3 days if she has an outbreak. 180 tablet 3   No current facility-administered medications for this visit.      ALLERGIES: Patient has no known allergies.  Family History  Problem Relation Age of Onset  . Heart failure Mother   . Prostate cancer Father 40  . Liver cancer Father   . Colon polyps Sister   . Breast cancer Sister 77  . Breast cancer Sister 64  . Ovarian cancer Cousin        died at 52  . Breast cancer Sister 33       recurrance at 21  . Breast cancer  Sister 79  . Colon polyps Sister   . Breast cancer Other        X 4 + age 64, 28 with recurrence, 65, 26  . Lung cancer Brother  smoked  . Breast cancer Paternal Aunt   . Prostate cancer Brother 20  . Melanoma Brother   . Colon cancer Other 16    Social History   Socioeconomic History  . Marital status: Divorced    Spouse name: Not on file  . Number of children: 1  . Years of education: Not on file  . Highest education level: Not on file  Occupational History  . Not on file  Social Needs  . Financial resource strain: Not on file  . Food insecurity:    Worry: Not on file    Inability: Not on file  . Transportation needs:    Medical: Not on file    Non-medical: Not on file  Tobacco Use  . Smoking status: Never Smoker  . Smokeless tobacco: Never Used  Substance and Sexual Activity  . Alcohol use: No  . Drug use: No  . Sexual activity: Not Currently    Partners: Male    Birth control/protection: Post-menopausal  Lifestyle  . Physical activity:    Days per week: Not on file    Minutes per session: Not on file  . Stress: Not on file  Relationships  . Social connections:    Talks on phone: Not on file    Gets together: Not on file    Attends religious service: Not on file    Active member of club or organization: Not on file    Attends meetings of clubs or organizations: Not on file    Relationship status: Not on file  . Intimate partner violence:    Fear of current or ex partner: Not on file    Emotionally abused: Not on file    Physically abused: Not on file    Forced sexual activity: Not on file  Other Topics Concern  . Not on file  Social History Narrative  . Not on file    Review of Systems  Constitutional: Negative.   HENT: Negative.   Eyes: Negative.   Respiratory: Negative.   Cardiovascular: Negative.   Gastrointestinal:       Hemorrhoid  Genitourinary: Negative.   Musculoskeletal: Negative.   Skin: Negative.   Neurological: Negative.    Endo/Heme/Allergies: Negative.   Psychiatric/Behavioral: Negative.     PHYSICAL EXAMINATION:    BP 128/70 (BP Location: Right Arm, Patient Position: Sitting, Cuff Size: Normal)   Pulse 80   Wt 121 lb 9.6 oz (55.2 kg)   LMP 05/16/2000 (Approximate)   BMI 22.24 kg/m     General appearance: alert, cooperative and appears stated age Abdomen: soft, non-tender; non distended, no masses,  no organomegaly  Pelvic: External genitalia:  no lesions              Anus:  1 cm tender external hemorrhoid noted, not clearly thrombosed    Chaperone was present for exam.  ASSESSMENT S/P hysteroscopy, polypectomy, D&C, benign pathology, patient doing well External hemorrhoid    PLAN Call with vaginal bleeding Discussed treatment of hemorrhoids Steroid ointment with lidocaine for BID use as needed Discussed symptomatic treatment Metamucil  Call if not improving    An After Visit Summary was printed and given to the patient.

## 2018-04-02 NOTE — Telephone Encounter (Signed)
Spoke with Peter Garter, pharmacist at [facility]. Was advised Lidocain-hydrocortiosone cream not in stock at any [facility] locally, requesting alternative. Placed on brief hold to review alternative meds.   Reviewed with Dr. Talbert Nan, ok to sen RX for recommended alternative.   Covered alternative recommended by pharmacist, Verbal order given:  Lidocaine prilocaine 2.5-2.5% cream, Apply 1 Dose topically 2 (two) times daily as needed. Dispense 30 grams. No refills.   Patient notified of RX.    Routing to provider for final review. Patient is agreeable to disposition. Will close encounter.

## 2018-04-02 NOTE — Patient Instructions (Addendum)
You can try metamucil  Hemorrhoids Hemorrhoids are swollen veins in and around the rectum or anus. There are two types of hemorrhoids:  Internal hemorrhoids. These occur in the veins that are just inside the rectum. They may poke through to the outside and become irritated and painful.  External hemorrhoids. These occur in the veins that are outside of the anus and can be felt as a painful swelling or hard lump near the anus.  Most hemorrhoids do not cause serious problems, and they can be managed with home treatments such as diet and lifestyle changes. If home treatments do not help your symptoms, procedures can be done to shrink or remove the hemorrhoids. What are the causes? This condition is caused by increased pressure in the anal area. This pressure may result from various things, including:  Constipation.  Straining to have a bowel movement.  Diarrhea.  Pregnancy.  Obesity.  Sitting for long periods of time.  Heavy lifting or other activity that causes you to strain.  Anal sex.  What are the signs or symptoms? Symptoms of this condition include:  Pain.  Anal itching or irritation.  Rectal bleeding.  Leakage of stool (feces).  Anal swelling.  One or more lumps around the anus.  How is this diagnosed? This condition can often be diagnosed through a visual exam. Other exams or tests may also be done, such as:  Examination of the rectal area with a gloved hand (digital rectal exam).  Examination of the anal canal using a small tube (anoscope).  A blood test, if you have lost a significant amount of blood.  A test to look inside the colon (sigmoidoscopy or colonoscopy).  How is this treated? This condition can usually be treated at home. However, various procedures may be done if dietary changes, lifestyle changes, and other home treatments do not help your symptoms. These procedures can help make the hemorrhoids smaller or remove them completely. Some of  these procedures involve surgery, and others do not. Common procedures include:  Rubber band ligation. Rubber bands are placed at the base of the hemorrhoids to cut off the blood supply to them.  Sclerotherapy. Medicine is injected into the hemorrhoids to shrink them.  Infrared coagulation. A type of light energy is used to get rid of the hemorrhoids.  Hemorrhoidectomy surgery. The hemorrhoids are surgically removed, and the veins that supply them are tied off.  Stapled hemorrhoidopexy surgery. A circular stapling device is used to remove the hemorrhoids and use staples to cut off the blood supply to them.  Follow these instructions at home: Eating and drinking  Eat foods that have a lot of fiber in them, such as whole grains, beans, nuts, fruits, and vegetables. Ask your health care provider about taking products that have added fiber (fiber supplements).  Drink enough fluid to keep your urine clear or pale yellow. Managing pain and swelling  Take warm sitz baths for 20 minutes, 3-4 times a day to ease pain and discomfort.  If directed, apply ice to the affected area. Using ice packs between sitz baths may be helpful. ? Put ice in a plastic bag. ? Place a towel between your skin and the bag. ? Leave the ice on for 20 minutes, 2-3 times a day. General instructions  Take over-the-counter and prescription medicines only as told by your health care provider.  Use medicated creams or suppositories as told.  Exercise regularly.  Go to the bathroom when you have the urge to have a  bowel movement. Do not wait.  Avoid straining to have bowel movements.  Keep the anal area dry and clean. Use wet toilet paper or moist towelettes after a bowel movement.  Do not sit on the toilet for long periods of time. This increases blood pooling and pain. Contact a health care provider if:  You have increasing pain and swelling that are not controlled by treatment or medicine.  You have  uncontrolled bleeding.  You have difficulty having a bowel movement, or you are unable to have a bowel movement.  You have pain or inflammation outside the area of the hemorrhoids. This information is not intended to replace advice given to you by your health care provider. Make sure you discuss any questions you have with your health care provider. Document Released: 04/29/2000 Document Revised: 09/30/2015 Document Reviewed: 01/14/2015 Elsevier Interactive Patient Education  [employer].

## 2018-04-02 NOTE — Telephone Encounter (Signed)
[facility] [address] in [place]  Patient called and said her pharmacy does not have Lidocaine-Hydrocortison in any of their stores. Patient was seen today and needs an alternative.

## 2018-04-02 NOTE — Progress Notes (Signed)
Erroneous encounter

## 2018-04-02 NOTE — Telephone Encounter (Signed)
Patient thinks she may have a hemorrhoid. She is scheduled for post op on Wednesday.

## 2018-04-02 NOTE — Telephone Encounter (Signed)
Patient states that Lidocaine rx is not at her local [facility] [address]. Is there to pick it up and was advised this was not received. Rx for Lidocaine Hydrocortisone cream resent to pharmacy on file. Encounter closed.

## 2018-04-02 NOTE — Telephone Encounter (Signed)
Call to patient. Patient states that she has what she thinks is a "grape sized" hemorrhoid at the entrance of her rectum. Patient states it is painful and she is having to sit differently because of it. Denies bleeding. States she was up 4 times Friday night trying to have a BM, states she was having to strain. Saturday she woke up and had diarrhea. The soreness started Saturday night and Sunday she noticed the hemorrhoid. Did have a BM this am that required some straining. RN advised OV need for further evaluation. Reviewed with nursing supervisor, patient has post op appointment scheduled for 04-04-18. Patient states she is okay to treat at home until Wednesday, but RN advised recommended sooner appointment. Patient's appointment rescheduled to today at 1345 per Lamont Snowball, RN. Patient agreeable to date and time of appointment.   Routing to provider and will close encounter.

## 2018-04-04 ENCOUNTER — Ambulatory Visit: Payer: Medicare Other | Admitting: Obstetrics and Gynecology

## 2018-09-17 ENCOUNTER — Ambulatory Visit: Payer: Medicare Other | Admitting: Obstetrics and Gynecology

## 2018-10-29 ENCOUNTER — Other Ambulatory Visit: Payer: Self-pay

## 2018-10-29 ENCOUNTER — Encounter: Payer: Self-pay | Admitting: Obstetrics and Gynecology

## 2018-10-29 ENCOUNTER — Ambulatory Visit (INDEPENDENT_AMBULATORY_CARE_PROVIDER_SITE_OTHER): Payer: Medicare Other | Admitting: Obstetrics and Gynecology

## 2018-10-29 VITALS — BP 122/78 | HR 76 | Temp 98.1°F | Ht 62.0 in | Wt 121.3 lb

## 2018-10-29 DIAGNOSIS — N8111 Cystocele, midline: Secondary | ICD-10-CM

## 2018-10-29 DIAGNOSIS — Z853 Personal history of malignant neoplasm of breast: Secondary | ICD-10-CM

## 2018-10-29 DIAGNOSIS — N814 Uterovaginal prolapse, unspecified: Secondary | ICD-10-CM

## 2018-10-29 DIAGNOSIS — N3946 Mixed incontinence: Secondary | ICD-10-CM

## 2018-10-29 DIAGNOSIS — Z01419 Encounter for gynecological examination (general) (routine) without abnormal findings: Secondary | ICD-10-CM

## 2018-10-29 DIAGNOSIS — Z8739 Personal history of other diseases of the musculoskeletal system and connective tissue: Secondary | ICD-10-CM | POA: Diagnosis not present

## 2018-10-29 MED ORDER — VALACYCLOVIR HCL 500 MG PO TABS: ORAL_TABLET | ORAL | 4 refills | Status: DC

## 2018-10-29 NOTE — Progress Notes (Signed)
73 y.o. G95P2001 Divorced White or Caucasian Not Hispanic or Latino female here for annual exam.  Wants to talk about prior dx of kidney stones. Denies any current symptoms. The patient had severe pain with urination (no burning), frequency, urgency to void and blood in her urine in 10/19 and again in 5/20. She had a UTI on culture in 10/19, in 5/20 she was treated, unsure if a culture was sent. Both times treated with antibiotics and both times symptoms improved within a few days. She saw her primary a couple of days later, had trace blood in her urine.   S/P hysteroscopy, polypectomy, D&C in 11/19, pathology was benign.  No vaginal bleeding.   H/O grade 2 cystocele and grade 1-2 uterine prolapse. She notices the bulge when she goes to the bathroom, occasionally with walking.  Mild mixed incontinence. Mostly voids normal amounts. Typically feel empty, sometimes double voids.   H/O breast cancer, s/p lumpectomy in 9/17, radiation. Negative genetic testing.     Patient's last menstrual period was 05/16/2000 (approximate).          Sexually active: No.  The current method of family planning is post menopausal status.    Exercising: Yes.    walking Smoker:  no  Health Maintenance: Pap:     05/02/2018 WNL   05/29/14, Negative with neg HR HPV History of Abnormal Pap: No  MMG:01/30/2018 Birads 2 benign Colonoscopy: 04/08/13, repeat in 5 years, delayed because of Covid 19 BMD:12/120/2018 Osteoporosis TDaP: 02/08/11   reports that she has never smoked. She has never used smokeless tobacco. She reports that she does not drink alcohol or use drugs. Her Son lives in [place], 2 grandsons  (20 and 5).   Past Medical History:  Diagnosis Date  . Anemia   . Aortic atherosclerosis ([place]) 12/23/2016   Noted on CT  . Arthritis   . BRCA gene mutation negative 03/2016  . Breast cancer in female [facility]) 12/2015   left breast cancer, Estrogen negative, progesterone slight positive  . Bronchiectasis ([place])    . Complication of anesthesia   . Dry eye syndrome   . Dyslipidemia   . Dyspnea   . Early cataract   . Endometrial polyp   . Family history of breast cancer   . Family history of ovarian cancer   . Family history of prostate cancer   . Gall stones   . GERD (gastroesophageal reflux disease)   . Hematuria 03/09/2018  . History of bronchitis   . History of hiatal hernia   . History of kidney stones 03/09/2018  . HSV infection    pos I and II  . Hypothyroidism   . Internal and external hemorrhoids without complication 99/24/2683   noted on colonoscopy   . Lymphedema    Left breast  . Macular degeneration of both eyes   . Osteopenia   . Osteoporosis 1998   Off Fosomax 2008, Prolia 03/17/11, 09/14/11, 03/16/12  . Persistent dry cough   . Personal history of radiation therapy   . PMB (postmenopausal bleeding)   . PONV (postoperative nausea and vomiting)   . Pulmonary nodule 11/13/2015   10 to 11 mm subpleural pulmonary nodules in the right middle and right upper lobes   . Shoulder impingement, right 11/22/2016  . Vitamin D deficiency   . Wears glasses     Past Surgical History:  Procedure Laterality Date  . AUGMENTATION MAMMAPLASTY  1981  . BREAST IMPLANT REMOVAL  1982  . BREAST LUMPECTOMY WITH  RADIOACTIVE SEED AND SENTINEL LYMPH NODE BIOPSY Left 02/11/2016   Procedure: LEFT BREAST LUMPECTOMY WITH RADIOACTIVE SEED AND LEFT SENTINEL LYMPH NODE BIOPSY;  Surgeon: [name], MD;  Location: [place];  Service: General;  Laterality: Left;  . COLONOSCOPY  10/04   Dr Watt Climes  . COLONOSCOPY  04/07/08   normal - Dr. Watt Climes  . COLONOSCOPY  04/08/2013  . DILATATION & CURETTAGE/HYSTEROSCOPY WITH MYOSURE N/A 03/19/2018   Procedure: DILATATION & CURETTAGE/HYSTEROSCOPY;  Surgeon: Salvadore Dom, MD;  Location: [facility];  Service: Gynecology;  Laterality: N/A;  . ELBOW SURGERY Right 12/1997  . ESOPHAGOGASTRODUODENOSCOPY ENDOSCOPY  08/2012   [place] hernia with GERD  .  EXCISION OF BREAST BIOPSY Right 10/19/2017   Procedure: EXCISIONAL BIOPSY OF RIGHT BREAST;  Surgeon: [name], MD;  Location: [place];  Service: General;  Laterality: Right;  . FOOT NEUROMA SURGERY Left 2004?  . TONSILLECTOMY AND ADENOIDECTOMY    . TUBAL LIGATION  1981    Current Outpatient Medications  Medication Sig Dispense Refill  . B Complex Vitamins (B COMPLEX-B12 PO) Take by mouth daily.    Marland Kitchen Bioflavonoid Products (C COMPLEX PO) Take by mouth 1 day or 1 dose.    . Calcium Carbonate (CALCIUM 600 PO) Take by mouth daily.    . Carboxymethylcellulose Sod PF (RETAINE CMC) 0.5 % SOLN Apply 1 drop to eye 4 (four) times daily as needed.    . Cholecalciferol (VITAMIN D3) 5000 units CAPS Take 1 capsule by mouth daily.    Marland Kitchen denosumab (PROLIA) 60 MG/ML SOLN injection Inject 60 mg into the skin every 6 (six) months. Administer in upper arm, thigh, or abdomen    . famotidine (PEPCID AC) 10 MG tablet Take 10 mg by mouth 2 (two) times daily.    . Ferrous Sulfate (IRON) 28 MG TABS Take 28 mg by mouth daily. Mon, Wed, and Fridays only    . Multiple Vitamin (MULTI-VITAMIN PO) Take by mouth 2 (two) times daily as needed. Women's 50 +    . Multiple Vitamins-Minerals (ICAPS AREDS 2) CAPS Take by mouth 2 (two) times daily.    . RESTASIS 0.05 % ophthalmic emulsion Place 1 drop into both eyes 2 (two) times daily.     Marland Kitchen SYNTHROID 100 MCG tablet Take 1 tablet by mouth daily. Take 1 tablet daily except Wednesday. On Wednesday take 1/2 tablet.    . tamoxifen (NOLVADEX) 20 MG tablet Take 1 tablet (20 mg total) by mouth daily. 90 tablet 3  . valACYclovir (VALTREX) 500 MG tablet Take one tablet a day, can increase 1 tablet BID for 3 days if she has an outbreak. 180 tablet 3   No current facility-administered medications for this visit.     Family History  Problem Relation Age of Onset  . Heart failure Mother   . Prostate cancer Father 52  . Liver cancer Father   . Colon polyps Sister   .  Breast cancer Sister 88  . Breast cancer Sister 78  . Ovarian cancer Cousin        died at 6  . Breast cancer Sister 55       recurrance at 85  . Breast cancer Sister 60  . Colon polyps Sister   . Breast cancer Other        X 4 + age 77, 89 with recurrence, 74, 3  . Lung cancer Brother        smoked  . Breast cancer Paternal Aunt   .  Prostate cancer Brother 11  . Melanoma Brother   . Colon cancer Other 50    Review of Systems  Constitutional: Negative.   HENT: Negative.   Eyes: Negative.   Respiratory: Negative.   Cardiovascular: Negative.   Gastrointestinal: Negative.   Endocrine: Negative.   Genitourinary: Negative.   Musculoskeletal: Negative.   Skin: Negative.   Allergic/Immunologic: Negative.   Neurological: Negative.   Hematological: Negative.   Psychiatric/Behavioral: Negative.     Exam:   BP 122/78 (BP Location: Right Arm, Patient Position: Sitting, Cuff Size: Normal)   Pulse 76   Temp 98.1 F (36.7 C) (Skin)   Ht '5\' 2"'  (1.575 m)   Wt 121 lb 4.8 oz (55 kg)   LMP 05/16/2000 (Approximate)   BMI 22.19 kg/m   Weight change: '@WEIGHTCHANGE' @ Height:   Height: '5\' 2"'  (157.5 cm)  Ht Readings from Last 3 Encounters:  10/29/18 '5\' 2"'  (1.575 m)  03/14/18 '5\' 2"'  (1.575 m)  03/02/18 '5\' 2"'  (1.575 m)    General appearance: alert, cooperative and appears stated age Head: Normocephalic, without obvious abnormality, atraumatic Neck: no adenopathy, supple, symmetrical, trachea midline and thyroid normal to inspection and palpation Lungs: clear to auscultation bilaterally Cardiovascular: regular rate and rhythm Breasts: normal appearance, no masses or tenderness, evidence of right lumpectomy Abdomen: soft, non-tender; non distended,  no masses,  no organomegaly Extremities: extremities normal, atraumatic, no cyanosis or edema Skin: Skin color, texture, turgor normal. No rashes or lesions Lymph nodes: Cervical, supraclavicular, and axillary nodes normal. No abnormal  inguinal nodes palpated Neurologic: Grossly normal   Pelvic: External genitalia:  no lesions              Urethra:  normal appearing urethra with no masses, tenderness or lesions              Bartholins and Skenes: normal                 Vagina: normal appearing vagina with normal color and discharge, no lesions              Cervix: no lesions               Bimanual Exam:  Uterus:  normal size, contour, position, consistency, mobility, non-tender              Adnexa: no mass, fullness, tenderness               Rectovaginal: Confirms               Anus:  normal sphincter tone, no lesions  Chaperone was present for exam.  A:  Well Woman with normal exam  2 episodes of bladder pain, hematuria, UTI's since 10/19  H/O breast cancer  Genital prolapse  Mild mixed incontinence  Osteoporosis  H/O HSV  P:   Discussed the option of a pessary (she has used an estring in the past). She could potentially just use it prn.  DEXA with primary, she manages her Prolia  Discussed breast self exam  Discussed calcium and vit D intake  Mammogram in the fall  Colonoscopy due  Refill of Valtrex

## 2018-10-29 NOTE — Patient Instructions (Addendum)
EXERCISE AND DIET:  We recommended that you start or continue a regular exercise program for good health. Regular exercise means any activity that makes your heart beat faster and makes you sweat.  We recommend exercising at least 30 minutes per day at least 3 days a week, preferably 4 or 5.  We also recommend a diet low in fat and sugar.  Inactivity, poor dietary choices and obesity can cause diabetes, heart attack, stroke, and kidney damage, among others.   ° °ALCOHOL AND SMOKING:  Women should limit their alcohol intake to no more than 7 drinks/beers/glasses of wine (combined, not each!) per week. Moderation of alcohol intake to this level decreases your risk of breast cancer and liver damage. And of course, no recreational drugs are part of a healthy lifestyle.  And absolutely no smoking or even second hand smoke. Most people know smoking can cause heart and lung diseases, but did you know it also contributes to weakening of your bones? Aging of your skin?  Yellowing of your teeth and nails? ° °CALCIUM AND VITAMIN D:  Adequate intake of calcium and Vitamin D are recommended.  The recommendations for exact amounts of these supplements seem to change often, but generally speaking 1,200 mg of calcium (between diet and supplement) and 800 units of Vitamin D per day seems prudent. Certain women may benefit from higher intake of Vitamin D.  If you are among these women, your doctor will have told you during your visit.   ° °PAP SMEARS:  Pap smears, to check for cervical cancer or precancers,  have traditionally been done yearly, although recent scientific advances have shown that most women can have pap smears less often.  However, every woman still should have a physical exam from her gynecologist every year. It will include a breast check, inspection of the vulva and vagina to check for abnormal growths or skin changes, a visual exam of the cervix, and then an exam to evaluate the size and shape of the uterus and  ovaries.  And after 71 years of age, a rectal exam is indicated to check for rectal cancers. We will also provide age appropriate advice regarding health maintenance, like when you should have certain vaccines, screening for sexually transmitted diseases, bone density testing, colonoscopy, mammograms, etc.  ° °MAMMOGRAMS:  All women over 40 years old should have a yearly mammogram. Many facilities now offer a "3D" mammogram, which may cost around $50 extra out of pocket. If possible,  we recommend you accept the option to have the 3D mammogram performed.  It both reduces the number of women who will be called back for extra views which then turn out to be normal, and it is better than the routine mammogram at detecting truly abnormal areas.   ° °COLON CANCER SCREENING: Now recommend starting at age 45. At this time colonoscopy is not covered for routine screening until 50. There are take home tests that can be done between 45-49.  ° °COLONOSCOPY:  Colonoscopy to screen for colon cancer is recommended for all women at age 50.  We know, you hate the idea of the prep.  We agree, BUT, having colon cancer and not knowing it is worse!!  Colon cancer so often starts as a polyp that can be seen and removed at colonscopy, which can quite literally save your life!  And if your first colonoscopy is normal and you have no family history of colon cancer, most women don't have to have it again for   10 years.  Once every ten years, you can do something that may end up saving your life, right?  We will be happy to help you get it scheduled when you are ready.  Be sure to check your insurance coverage so you understand how much it will cost.  It may be covered as a preventative service at no cost, but you should check your particular policy.   ° ° ° °Breast Self-Awareness °Breast self-awareness means being familiar with how your breasts look and feel. It involves checking your breasts regularly and reporting any changes to your  health care provider. °Practicing breast self-awareness is important. A change in your breasts can be a sign of a serious medical problem. Being familiar with how your breasts look and feel allows you to find any problems early, when treatment is more likely to be successful. All women should practice breast self-awareness, including women who have had breast implants. °How to do a breast self-exam °One way to learn what is normal for your breasts and whether your breasts are changing is to do a breast self-exam. To do a breast self-exam: °Look for Changes ° °1. Remove all the clothing above your waist. °2. Stand in front of a mirror in a room with good lighting. °3. Put your hands on your hips. °4. Push your hands firmly downward. °5. Compare your breasts in the mirror. Look for differences between them (asymmetry), such as: °? Differences in shape. °? Differences in size. °? Puckers, dips, and bumps in one breast and not the other. °6. Look at each breast for changes in your skin, such as: °? Redness. °? Scaly areas. °7. Look for changes in your nipples, such as: °? Discharge. °? Bleeding. °? Dimpling. °? Redness. °? A change in position. °Feel for Changes °Carefully feel your breasts for lumps and changes. It is best to do this while lying on your back on the floor and again while sitting or standing in the shower or tub with soapy water on your skin. Feel each breast in the following way: °· Place the arm on the side of the breast you are examining above your head. °· Feel your breast with the other hand. °· Start in the nipple area and make ¾ inch (2 cm) overlapping circles to feel your breast. Use the pads of your three middle fingers to do this. Apply light pressure, then medium pressure, then firm pressure. The light pressure will allow you to feel the tissue closest to the skin. The medium pressure will allow you to feel the tissue that is a little deeper. The firm pressure will allow you to feel the tissue  close to the ribs. °· Continue the overlapping circles, moving downward over the breast until you feel your ribs below your breast. °· Move one finger-width toward the center of the body. Continue to use the ¾ inch (2 cm) overlapping circles to feel your breast as you move slowly up toward your collarbone. °· Continue the up and down exam using all three pressures until you reach your armpit. ° °Write Down What You Find ° °Write down what is normal for each breast and any changes that you find. Keep a written record with breast changes or normal findings for each breast. By writing this information down, you do not need to depend only on memory for size, tenderness, or location. Write down where you are in your menstrual cycle, if you are still menstruating. °If you are having trouble noticing differences   in your breasts, do not get discouraged. With time you will become more familiar with the variations in your breasts and more comfortable with the exam. How often should I examine my breasts? Examine your breasts every month. If you are breastfeeding, the best time to examine your breasts is after a feeding or after using a breast pump. If you menstruate, the best time to examine your breasts is 5-7 days after your period is over. During your period, your breasts are lumpier, and it may be more difficult to notice changes. When should I see my health care provider? See your health care provider if you notice:  A change in shape or size of your breasts or nipples.  A change in the skin of your breast or nipples, such as a reddened or scaly area.  Unusual discharge from your nipples.  A lump or thick area that was not there before.  Pain in your breasts.  Anything that concerns you.   Kegel Exercises Kegel exercises help strengthen the muscles that support the rectum, vagina, small intestine, bladder, and uterus. Doing Kegel exercises can help:  Improve bladder and bowel control.  Improve  sexual response.  Reduce problems and discomfort during pregnancy. Kegel exercises involve squeezing your pelvic floor muscles, which are the same muscles you squeeze when you try to stop the flow of urine. The exercises can be done while sitting, standing, or lying down, but it is best to vary your position. Exercises 1. Squeeze your pelvic floor muscles tight. You should feel a tight lift in your rectal area. If you are a female, you should also feel a tightness in your vaginal area. Keep your stomach, buttocks, and legs relaxed. 2. Hold the muscles tight for up to 10 seconds. 3. Relax your muscles. Repeat this exercise 50 times a day or as many times as told by your health care provider. Continue to do this exercise for at least 4-6 weeks or for as long as told by your health care provider. This information is not intended to replace advice given to you by your health care provider. Make sure you discuss any questions you have with your health care provider. Document Released: 04/18/2012 Document Revised: 09/12/2016 Document Reviewed: 03/22/2015 Elsevier Interactive Patient Education  2019 [employer].

## 2018-12-27 ENCOUNTER — Other Ambulatory Visit: Payer: Self-pay | Admitting: Oncology

## 2018-12-27 DIAGNOSIS — Z853 Personal history of malignant neoplasm of breast: Secondary | ICD-10-CM

## 2019-01-27 IMAGING — MG DIGITAL DIAGNOSTIC UNILATERAL RIGHT MAMMOGRAM WITH TOMO AND CAD
4 series · 4 of 12 positions shown · non-contrast
Comparison: Previous exam(s).

CLINICAL DATA: 71-year-old patient with history of left breast
cancer diagnosed January 2016. Over the past several years she has
noticed intermittent central right nipple inversion. With time, she
believes that the nipple inversion has become more constant, and not
as intermittent. She does not palpate a lump in the right breast.
She states that she has not always had nipple inversion. She has
never had a breast MRI.

EXAM:
DIGITAL DIAGNOSTIC RIGHT MAMMOGRAM WITH CAD AND TOMO
ULTRASOUND RIGHT BREAST

[R CC synth-2D]
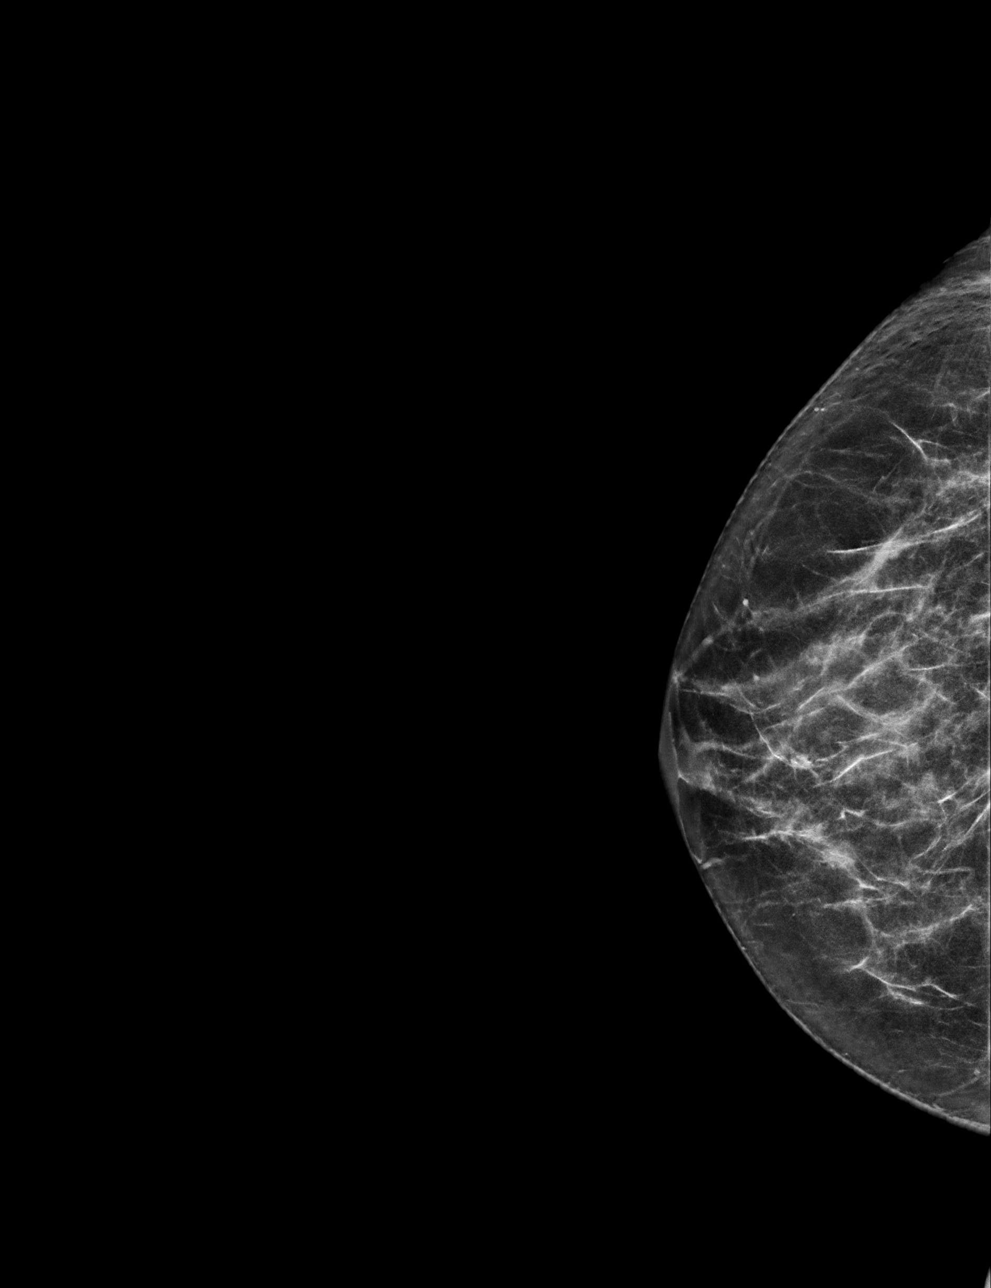

[R MLO synth-2D]
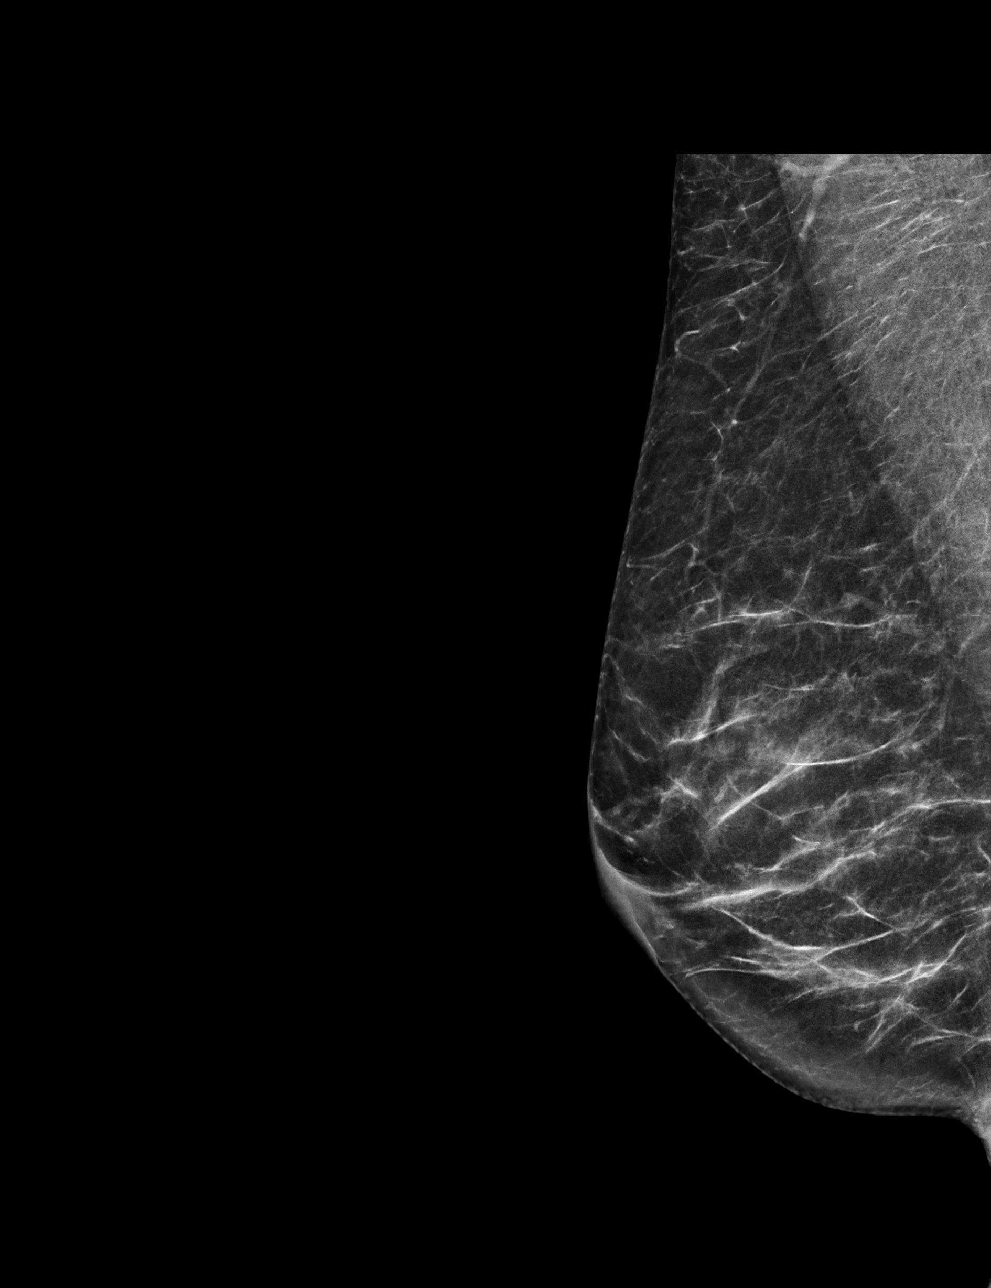

[R CC tomo · tomo slice 29/58.0]
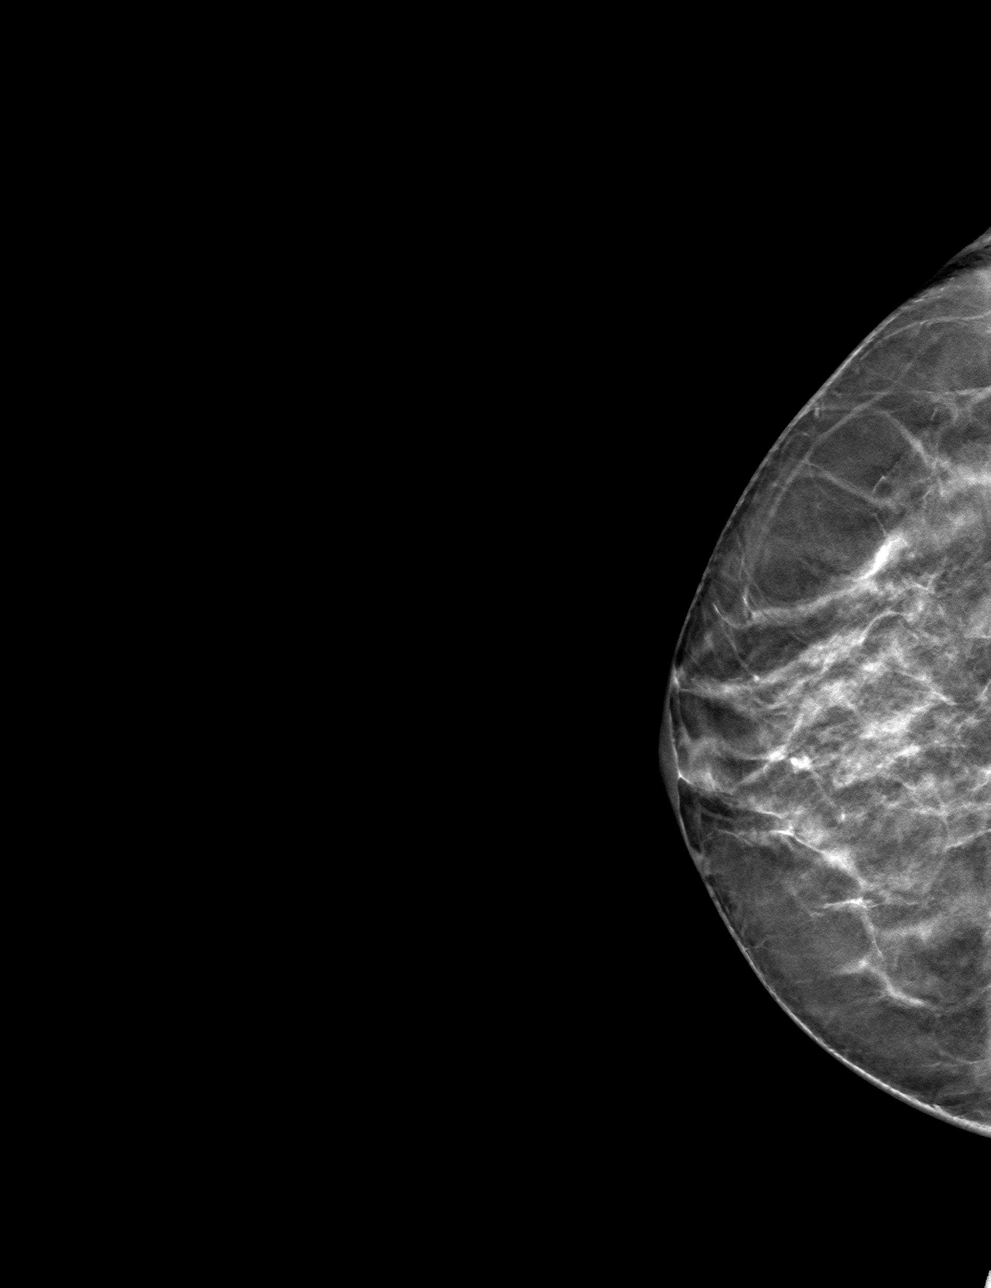

[R MLO tomo · tomo slice 29/57.0]
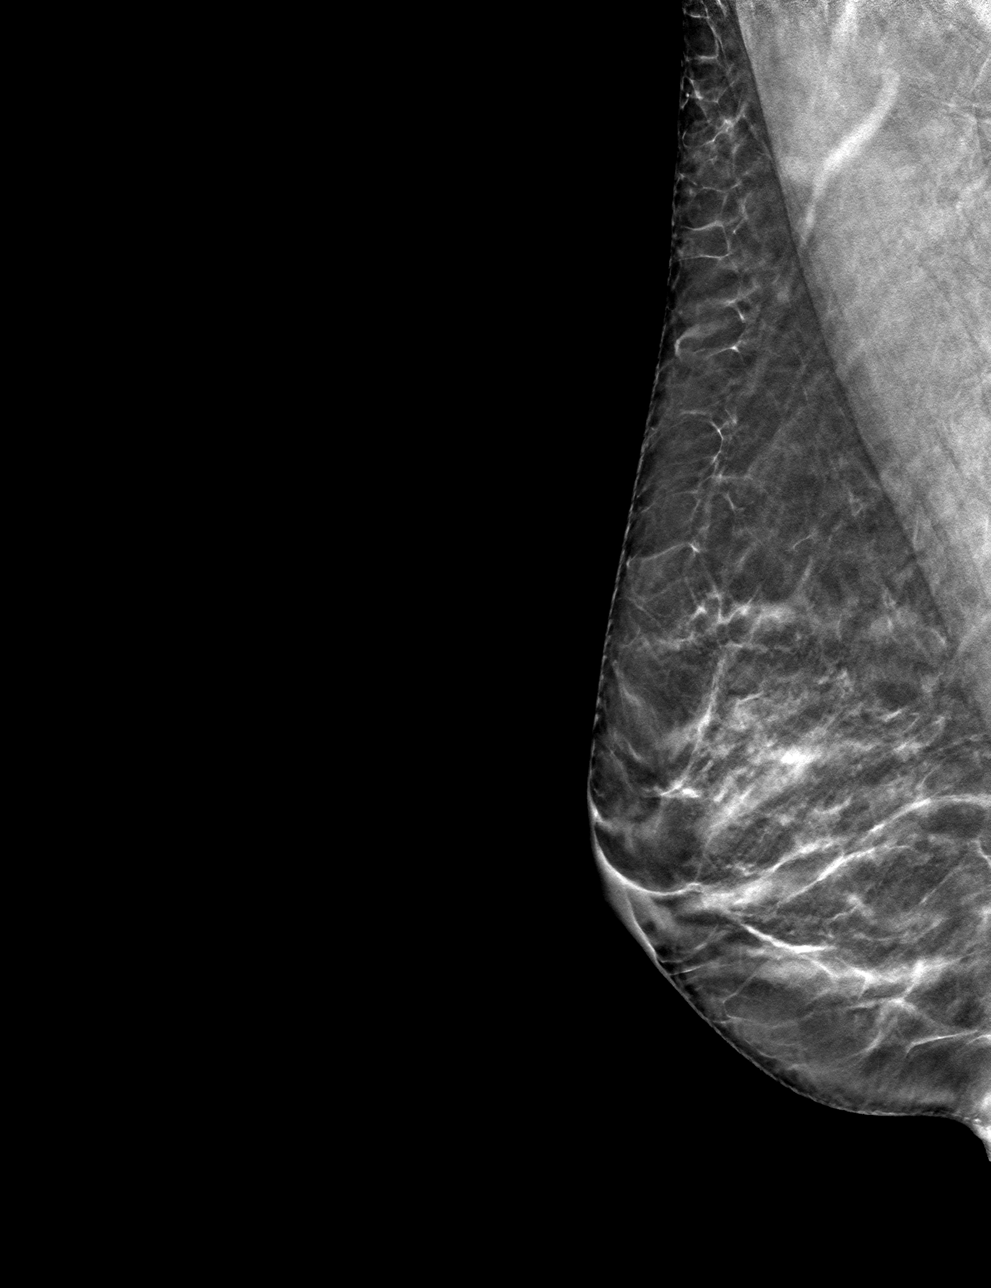

[4 of 12 positions shown; findings below may reference images not displayed]

ACR Breast Density Category c: The breast tissue is heterogeneously
dense, which may obscure small masses.
FINDINGS: No mass, architectural distortion, or suspicious microcalcification
is identified in the right breast to suggest malignancy.

Mammographic images were processed with CAD.

On physical exam, there is central nipple inversion on the right. I
do not palpate a mass in the right breast. The skin of the right
breast appears normal.

Targeted ultrasound is performed, showing normal fibroglandular
tissue in the right breast. No solid or cystic mass or abnormal
shadowing is identified to suggest malignancy.
IMPRESSION: No mammographic or sonographic evidence of malignancy in the right
breast. The patient describes progressive central right nipple
inversion and central right nipple inversion is seen on physical
exam today.

RECOMMENDATION:
The option of breast MRI was discussed with the patient today. We
discussed that nipple inversion can be caused by occult malignancy,
or can be benign in etiology. We discussed that MRI is sensitive in
excluding malignancy, but there can be false positive findings
requiring additional evaluation. At this point, she would like to
discuss today's results and her nipple inversion with Dr. Kimiyasu
at the time of her routine appointment with him in [REDACTED].
Additionally, the patient is due for her annual bilateral mammogram
in January 2018.

I have discussed the findings and recommendations with the patient.
Results were also provided in writing at the conclusion of the
visit. If applicable, a reminder letter will be sent to the patient
regarding the next appointment.

BI-RADS CATEGORY  1: Negative.

## 2019-02-04 ENCOUNTER — Other Ambulatory Visit: Payer: Self-pay | Admitting: Adult Health

## 2019-02-04 DIAGNOSIS — Z853 Personal history of malignant neoplasm of breast: Secondary | ICD-10-CM

## 2019-02-05 ENCOUNTER — Other Ambulatory Visit: Payer: Self-pay

## 2019-02-05 ENCOUNTER — Ambulatory Visit
Admission: RE | Admit: 2019-02-05 | Discharge: 2019-02-05 | Disposition: A | Discharge status: Home / Self Care | Payer: Medicare Other | Source: Ambulatory Visit | Attending: Oncology | Admitting: Oncology

## 2019-02-05 DIAGNOSIS — Z853 Personal history of malignant neoplasm of breast: Secondary | ICD-10-CM

## 2019-02-13 NOTE — Progress Notes (Signed)
[place]  [phone] [phone]     ID: Paula Dawson DOB: 1945/06/28  MR#: [id number]  UKG#:[id number]  Patient Care Team: Paula Stains, MD as PCP - General (Family Medicine) Paula Klein, MD as Consulting Physician (General Surgery) Paula Dawson, Paula Dad, MD as Consulting Physician (Oncology) Paula Rudd, MD as Consulting Physician (Radiation Oncology) Paula Boroughs, FNP as Nurse Practitioner (Family Medicine) Paula Essex, MD as Consulting Physician (Gastroenterology) OTHER MD:  CHIEF COMPLAINT: Ductal carcinoma in situ  CURRENT TREATMENT: Tamoxifen   INTERVAL HISTORY: Paula Dawson is here today for f/u of her estrogen positive breast cancer.    She is taking Tamoxifen daily.  She has no side effects from this medication that she is aware of.  Since her last visit, she underwent bilateral diagnostic mammography with tomography at [facility] on 02/05/2019 showing: breast density category B; no evidence of malignancy in either breast.  She also presented to her gynecologist with hematuria on 03/02/2018. Pap smear performed that day was negative, and urine culture showed bacterial infection. She was treated with antibiotics.  At follow up on 03/06/2018, she reported feeling like she passed a kidney stone. Sonohysterogram was performed at that time, which showed an endometrial polyp.  She proceeded to dilation and curettage on 03/19/2018 under Dr. Talbert Dawson. Pathology from the procedure ([id number]) showed: unremarkable endocervical glandular and squamous mucosa.   REVIEW OF SYSTEMS: Paula Dawson tells me since her last visit here she underwent colonoscopy and EGD.  She says everything was fine except she had gastritis.  She does not know whether H. pylori was found or not.  She still keeps dry cough which is most likely due to reflux issues which she is known to have but could also have an element of asthma.  Aside from these issues a detailed  review of systems today was stable and she walks about 30 minutes outside about 3 times a week.  She is keeping appropriate pandemic precautions.   BREAST CANCER HISTORY: From the original intake note:  Paula Dawson had bilateral screening mammography at the [facility] 01/06/2016, showing a possible mass in the left breast. On 01/13/2016 she had left diagnostic mammography with tomography and left breast ultrasonography. The breast density was category C. In the upper outer quadrant of the left breast there was an area of asymmetry measuring 0.8 cm. On physical exam there was no palpable mass. Ultrasonography confirmed an irregular hypoechoic mass at the 2:00 position 6 cm from the nipple measuring 1.4 cm. Ultrasound of the left axilla was benign.  On 01/15/2016 the patient underwent biopsy of the left breast mass in question, and this showed ([id number]) ductal carcinoma in situ, grade 3, estrogen receptor negative, progesterone receptor questionably positive (20% with weak staining intensity).  Her subsequent history is as detailed below   PAST MEDICAL HISTORY: Past Medical History:  Diagnosis Date  . Anemia   . Aortic atherosclerosis ([place]) 12/23/2016   Noted on CT  . Arthritis   . BRCA gene mutation negative 03/2016  . Breast cancer ([place]) 2017   Left Breast Cancer  . Breast cancer in female [facility]) 12/2015   left breast cancer, Estrogen negative, progesterone slight positive  . Bronchiectasis ([place])   . Complication of anesthesia   . Dry eye syndrome   . Dyslipidemia   . Dyspnea   . Early cataract   . Endometrial polyp   . Family history of breast cancer   . Family history of ovarian cancer   .  Family history of prostate cancer   . Gall stones   . GERD (gastroesophageal reflux disease)   . Hematuria 03/09/2018  . History of bronchitis   . History of hiatal hernia   . History of kidney stones 03/09/2018  . HSV infection    pos I and II  . Hypothyroidism   . Internal and  external hemorrhoids without complication 86/48/4720   noted on colonoscopy   . Lymphedema    Left breast  . Macular degeneration of both eyes   . Osteopenia   . Osteoporosis 1998   Off Fosomax 2008, Prolia 03/17/11, 09/14/11, 03/16/12  . Persistent dry cough   . Personal history of radiation therapy 2017   Left Breast Cancer  . PMB (postmenopausal bleeding)   . PONV (postoperative nausea and vomiting)   . Pulmonary nodule 11/13/2015   10 to 11 mm subpleural pulmonary nodules in the right middle and right upper lobes   . Shoulder impingement, right 11/22/2016  . Vitamin D deficiency   . Wears glasses     PAST SURGICAL HISTORY: Past Surgical History:  Procedure Laterality Date  . AUGMENTATION MAMMAPLASTY  1981  . BREAST IMPLANT REMOVAL  1982  . BREAST LUMPECTOMY WITH RADIOACTIVE SEED AND SENTINEL LYMPH NODE BIOPSY Left 02/11/2016   Procedure: LEFT BREAST LUMPECTOMY WITH RADIOACTIVE SEED AND LEFT SENTINEL LYMPH NODE BIOPSY;  Surgeon: Paula Klein, MD;  Location: [facility];  Service: General;  Laterality: Left;  . COLONOSCOPY  10/04   Dr Watt Climes  . COLONOSCOPY  04/07/08   normal - Dr. Watt Climes  . COLONOSCOPY  04/08/2013  . DILATATION & CURETTAGE/HYSTEROSCOPY WITH MYOSURE N/A 03/19/2018   Procedure: DILATATION & CURETTAGE/HYSTEROSCOPY;  Surgeon: Salvadore Dom, MD;  Location[facility];  Service: Gynecology;  Laterality: N/A;  . ELBOW SURGERY Right 12/1997  . ESOPHAGOGASTRODUODENOSCOPY ENDOSCOPY  08/2012   [place] hernia with GERD  . EXCISION OF BREAST BIOPSY Right 10/19/2017   Procedure: EXCISIONAL BIOPSY OF RIGHT BREAST;  Surgeon: Paula Klein, MD;  Location: [place];  Service: General;  Laterality: Right;  . FOOT NEUROMA SURGERY Left 2004?  . TONSILLECTOMY AND ADENOIDECTOMY    . TUBAL LIGATION  1981    FAMILY HISTORY Family History  Problem Relation Age of Onset  . Heart failure Mother   . Prostate cancer Father 66  . Liver cancer Father   .  Colon polyps Sister   . Breast cancer Sister 12  . Breast cancer Sister 31  . Ovarian cancer Cousin        died at 5  . Breast cancer Sister 36       recurrance at 38  . Breast cancer Sister 40  . Colon polyps Sister   . Breast cancer Other        X 4 + age 83, 95 with recurrence, 23, 17  . Lung cancer Brother        smoked  . Breast cancer Paternal Aunt   . Prostate cancer Brother 58  . Melanoma Brother   . Colon cancer Other 28  The patient's father died at age 40. He had been diagnosed with prostate "and liver" cancer at the age of 75. The patient's mother died at age 72. The patient had 4 brothers, 5 sisters. One brother was diagnosed with lung cancer at age 44. She has 4 sisters with breast cancer, one initially diagnosed at age 71, recurring at age 29.   GYNECOLOGIC HISTORY:  Patient's last menstrual  period was 05/16/2000 (approximate). Menarche age 32, first live birth age 15, the patient is GX P1. She stopped having periods approximately 2002. She used hormone replacement for approximately 5 years. 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 She is divorced and lives alone, with 2 cats. Her son Halayna Blane lives in [place]. His wife is a Cabin crew and he is her handyman. The patient has 2 grandsons. She attends a [employer] in the [place] area    ADVANCED DIRECTIVES: In place; the patient has named her son [name] as her healthcare part of attorney. He can be reached at [phone].   HEALTH MAINTENANCE: Social History   Tobacco Use  . Smoking status: Never Smoker  . Smokeless tobacco: Never Used  Substance Use Topics  . Alcohol use: No  . Drug use: No     Colonoscopy: 2019?/[facility]  PAP:  Bone density: December 20 18/-2.7   No Known Allergies  Current Outpatient Medications  Medication Sig Dispense Refill  . B  Complex Vitamins (B COMPLEX-B12 PO) Take by mouth daily.    Marland Kitchen Bioflavonoid Products (C COMPLEX PO) Take by mouth 1 day or 1 dose.    . Calcium Carbonate (CALCIUM 600 PO) Take by mouth daily.    . Carboxymethylcellulose Sod PF (RETAINE CMC) 0.5 % SOLN Apply 1 drop to eye 4 (four) times daily as needed.    . Cholecalciferol (VITAMIN D3) 5000 units CAPS Take 1 capsule by mouth daily.    Marland Kitchen denosumab (PROLIA) 60 MG/ML SOLN injection Inject 60 mg into the skin every 6 (six) months. Administer in upper arm, thigh, or abdomen    . famotidine (PEPCID AC) 10 MG tablet Take 10 mg by mouth 2 (two) times daily.    . Ferrous Sulfate (IRON) 28 MG TABS Take 28 mg by mouth daily. Mon, Wed, and Fridays only    . Multiple Vitamin (MULTI-VITAMIN PO) Take by mouth 2 (two) times daily as needed. Women's 50 +    . Multiple Vitamins-Minerals (ICAPS AREDS 2) CAPS Take by mouth 2 (two) times daily.    . RESTASIS 0.05 % ophthalmic emulsion Place 1 drop into both eyes 2 (two) times daily.     Marland Kitchen SYNTHROID 100 MCG tablet Take 1 tablet by mouth daily. Take 1 tablet daily except Wednesday. On Wednesday take 1/2 tablet.    . tamoxifen (NOLVADEX) 20 MG tablet Take 1 tablet (20 mg total) by mouth daily. 90 tablet 3  . valACYclovir (VALTREX) 500 MG tablet Take one tablet a day, can increase 1 tablet BID for 3 days if she has an outbreak. 90 tablet 4   No current facility-administered medications for this visit.     OBJECTIVE: Middle-aged white woman in no acute distress Vitals:   02/14/19 1301  BP: 120/83  Pulse: 80  Resp: 18  Temp: 98.9 F (37.2 C)  SpO2: 96%     Body mass index is 22.55 kg/m.    ECOG FS:1 - Symptomatic but completely ambulatory  Sclerae unicteric, EOMs intact Wearing a mask No cervical or supraclavicular adenopathy Lungs no rales or rhonchi Heart regular rate and rhythm Abd soft, nontender, positive bowel sounds MSK no focal spinal tenderness, no detectable left upper extremity lymphedema Neuro:  nonfocal, well oriented, appropriate affect Breasts: The right breast is benign.  The left breast is status post lumpectomy and radiation.  There is no evidence of local recurrence.  Both axillae are benign.     LAB RESULTS:  CMP     Component Value Date/Time   NA 143 03/19/2018 0913   NA 142 01/27/2016 1223   K 3.7 03/19/2018 0913   K 3.6 01/27/2016 1223   CL 108 02/09/2016 1328   CO2 25 02/09/2016 1328   CO2 27 01/27/2016 1223   GLUCOSE 96 03/19/2018 0913   GLUCOSE 131 01/27/2016 1223   BUN 14 02/09/2016 1328   BUN 14.8 01/27/2016 1223   CREATININE 0.73 02/09/2016 1328   CREATININE 0.8 01/27/2016 1223   CALCIUM 9.1 02/09/2016 1328   CALCIUM 9.1 01/27/2016 1223   PROT 6.6 01/27/2016 1223   ALBUMIN 3.6 01/27/2016 1223   AST 21 01/27/2016 1223   ALT 21 01/27/2016 1223   ALKPHOS 93 01/27/2016 1223   BILITOT 0.36 01/27/2016 1223   GFRNONAA >60 02/09/2016 1328   GFRAA >60 02/09/2016 1328    INo results found for: SPEP, UPEP  Lab Results  Component Value Date   WBC 10.9 (H) 02/09/2016   NEUTROABS 7.0 (H) 01/27/2016   HGB 13.6 03/19/2018   HCT 40.0 03/19/2018   MCV 96.1 02/09/2016   PLT 255 02/09/2016      Chemistry      Component Value Date/Time   NA 143 03/19/2018 0913   NA 142 01/27/2016 1223   K 3.7 03/19/2018 0913   K 3.6 01/27/2016 1223   CL 108 02/09/2016 1328   CO2 25 02/09/2016 1328   CO2 27 01/27/2016 1223   BUN 14 02/09/2016 1328   BUN 14.8 01/27/2016 1223   CREATININE 0.73 02/09/2016 1328   CREATININE 0.8 01/27/2016 1223      Component Value Date/Time   CALCIUM 9.1 02/09/2016 1328   CALCIUM 9.1 01/27/2016 1223   ALKPHOS 93 01/27/2016 1223   AST 21 01/27/2016 1223   ALT 21 01/27/2016 1223   BILITOT 0.36 01/27/2016 1223       No results found for: LABCA2  No components found for: LABCA125  No results for input(s): INR in the last 168 hours.  Urinalysis    Component Value Date/Time   BILIRUBINUR n 03/02/2018 1156   PROTEINUR  Negative 03/02/2018 1156   UROBILINOGEN 0.2 03/02/2018 1156   NITRITE n 03/02/2018 1156   LEUKOCYTESUR Moderate (2+) (A) 03/02/2018 1156     STUDIES: Mm Diag Breast Tomo Bilateral  Result Date: 02/05/2019 CLINICAL DATA:  73 year old patient with history of left breast cancer diagnosed in 2017. EXAM: DIGITAL DIAGNOSTIC BILATERAL MAMMOGRAM WITH CAD AND TOMO COMPARISON:  Previous exam(s). ACR Breast Density Category b: There are scattered areas of fibroglandular density. FINDINGS: There are lumpectomy changes in the deep superior left breast. No mass, nonsurgical distortion, or suspicious microcalcification is identified in either breast to suggest malignancy. Mammographic images were processed with CAD. IMPRESSION: No evidence of malignancy in either breast. Lumpectomy changes on the left. RECOMMENDATION: Diagnostic mammogram is suggested in 1 year. (Code:[id number]-B-01Y) I have discussed the findings and recommendations with the patient. If applicable, a reminder letter will be sent to the patient regarding the next appointment. BI-RADS CATEGORY  2: Benign. Electronically Signed   By: Curlene Dolphin M.D.   On: 02/05/2019 11:11    ELIGIBLE FOR AVAILABLE RESEARCH PROTOCOL: no  ASSESSMENT: 73 y.o. [place] woman status post left breast upper outer quadrant biopsy 01/15/2016 for ductal carcinoma in situ, grade 3, estrogen receptor negative, progesterone receptor questionably positive  (1) Status post left  lumpectomy and sentinel lymph node sampling 02/11/2016 for a pTis pN0, stage 0 ductal carcinoma in situ, grade 3, with negative margins.  (2) adjuvant radiation 03/10/2016 through 04/06/2016 Site/dose:   The patient initially received a dose of 42.5 Gy in 17 fractions to the breast using whole-breast tangent fields. This was delivered using a 3-D conformal technique. The patient then received a boost to the seroma. This delivered an additional 7.5 Gy in 3 fractions using a 3 field photon technique due to  the depth of the seroma. The total dose was 50 Gy.  (3) consider genetics 03/21/2016 through the Breast/Ovarian cancer gene panel.offered by [facility] found no deleterious mutations in  ATM, BARD1, BRCA1, BRCA2, BRIP1, CDH1, CHEK2, EPCAM, FANCC, MLH1, MSH2, MSH6, NBN, PALB2, PMS2, PTEN, RAD51C, RAD51D, TP53, and XRCC2  (4) tamoxifen started December 2017  (5) osteoporosis with a T score of -2.7 on bone density 05/04/2017  PLAN: Jordann is now 3 years out from definitive surgery for her breast cancer with no evidence of disease recurrence.  This is very favorable.  She is tolerating tamoxifen well and the plan is to continue that a total of 5 years.  We again reviewed her cough issue.  This is been going on for many years and is most likely due to reflux.  She is taking Pepcid for this with not much success.  She could switch to Prilosec which is a little bit more effective.  Alternatively this could be due to asthma.  I asked her to be aware if changes in weather for example make her cough a little bit more.  In any case from a breast cancer point of view she is doing terrific and I will see her again in 1 year.  She knows to call for any other issues that may develop before then.  Paula Dawson. Pete Merten, MD  02/14/19 1:47 PM Medical Oncology and Hematology [facility] [address], [place] [address] Tel. [phone]    Fax. [phone]   I, Wilburn Mylar, am acting as scribe for Dr. Virgie Dawson. Lyon Dumont.  I, Lurline Del MD, have reviewed the above documentation for accuracy and completeness, and I agree with the above.

## 2019-02-14 ENCOUNTER — Other Ambulatory Visit: Payer: Self-pay

## 2019-02-14 ENCOUNTER — Inpatient Hospital Stay: Payer: Medicare Other | Attending: Oncology | Admitting: Oncology

## 2019-02-14 VITALS — BP 120/83 | HR 80 | Temp 98.9°F | Resp 18 | Ht 62.0 in | Wt 123.3 lb

## 2019-02-14 DIAGNOSIS — E785 Hyperlipidemia, unspecified: Secondary | ICD-10-CM | POA: Diagnosis not present

## 2019-02-14 DIAGNOSIS — Z171 Estrogen receptor negative status [ER-]: Secondary | ICD-10-CM | POA: Diagnosis not present

## 2019-02-14 DIAGNOSIS — E119 Type 2 diabetes mellitus without complications: Secondary | ICD-10-CM | POA: Insufficient documentation

## 2019-02-14 DIAGNOSIS — Z170 Estrogen receptor positive status [ER+]: Secondary | ICD-10-CM | POA: Diagnosis not present

## 2019-02-14 DIAGNOSIS — Z8249 Family history of ischemic heart disease and other diseases of the circulatory system: Secondary | ICD-10-CM | POA: Diagnosis not present

## 2019-02-14 DIAGNOSIS — Z79899 Other long term (current) drug therapy: Secondary | ICD-10-CM | POA: Diagnosis not present

## 2019-02-14 DIAGNOSIS — Z8041 Family history of malignant neoplasm of ovary: Secondary | ICD-10-CM | POA: Insufficient documentation

## 2019-02-14 DIAGNOSIS — M810 Age-related osteoporosis without current pathological fracture: Secondary | ICD-10-CM | POA: Diagnosis not present

## 2019-02-14 DIAGNOSIS — Z803 Family history of malignant neoplasm of breast: Secondary | ICD-10-CM | POA: Insufficient documentation

## 2019-02-14 DIAGNOSIS — Z79810 Long term (current) use of selective estrogen receptor modulators (SERMs): Secondary | ICD-10-CM | POA: Diagnosis not present

## 2019-02-14 DIAGNOSIS — Z8042 Family history of malignant neoplasm of prostate: Secondary | ICD-10-CM | POA: Diagnosis not present

## 2019-02-14 DIAGNOSIS — C50412 Malignant neoplasm of upper-outer quadrant of left female breast: Secondary | ICD-10-CM

## 2019-02-14 DIAGNOSIS — D0512 Intraductal carcinoma in situ of left breast: Secondary | ICD-10-CM | POA: Insufficient documentation

## 2019-02-14 MED ORDER — TAMOXIFEN CITRATE 20 MG PO TABS: 20.0000 mg | ORAL_TABLET | Freq: Every day | ORAL | 3 refills | Status: DC

## 2019-02-15 ENCOUNTER — Telehealth: Payer: Self-pay | Admitting: Oncology

## 2019-02-15 NOTE — Telephone Encounter (Signed)
I talk with patient regarding schedule  

## 2019-10-30 ENCOUNTER — Telehealth: Payer: Self-pay | Admitting: Obstetrics and Gynecology

## 2019-10-30 NOTE — Telephone Encounter (Signed)
Spoke with pt. Pt states having [facility] on 11/07/19. Pt asking questions on pessary and having fitting at [facility].  All questions answered.  Encounter closed.

## 2019-10-30 NOTE — Telephone Encounter (Signed)
Patient has a question for nurse before aex on 11/07/19.

## 2019-11-05 NOTE — Progress Notes (Signed)
74 y.o. [id number] Divorced White or Caucasian Not Hispanic or Latino female here for annual exam and pessary fitting.  H/O grade 2 cystocele and grade 1-2 uterine prolapse. She is more bothered by the prolapse. Some days are worse than others. Very uncomfortable.   H/O mixed incontinence. She has started wearing panty liners to be safe, she just leaks occasionally. One episode of leaking urine while sleeping. Nocturia 0-1 x. Not sexually active.   H/O breast cancer, s/p lumpectomy in 9/17, radiation. Negative genetic testing. She stopped taking the tamoxifen in 3 years (instead of 5).     Patient's last menstrual period was 05/16/2000 (approximate).          Sexually active: No.  The current method of family planning is post menopausal status.    Exercising: Yes.    walking  Smoker:  no  Health Maintenance: Pap: 05/02/2018 WNL              05/29/14, Negative with neg HR HPV  History of abnormal Pap:  no MMG:  02/05/19 Bi-rads 2 benign  BMD:  05/04/17 Osteoporosis, followed with primary, will do in 12/21. On prolia.   Colonoscopy: 04/08/13, repeat in 5 years, delayed because of Covid 67. Had colonoscopy in 8/20, very tiny polyp, needs f/u in 5 years. Also had an endoscopy and was told she had gastritis.  TDaP:  02/08/11 Gardasil: NA    reports that she has never smoked. She has never used smokeless tobacco. She reports that she does not drink alcohol and does not use drugs. Her Son lives in [place], 2 grandsons (20 and 6).   Past Medical History:  Diagnosis Date  . Anemia   . Aortic atherosclerosis ([place]) 12/23/2016   Noted on CT  . Arthritis   . BRCA gene mutation negative 03/2016  . Breast cancer ([place]) 2017   Left Breast Cancer  . Breast cancer in female [facility]) 12/2015   left breast cancer, Estrogen negative, progesterone slight positive  . Bronchiectasis ([place])   . Complication of anesthesia   . Dry eye syndrome   . Dyslipidemia   . Dyspnea   . Early cataract   . Endometrial polyp    . Family history of breast cancer   . Family history of ovarian cancer   . Family history of prostate cancer   . Gall stones   . GERD (gastroesophageal reflux disease)   . Hematuria 03/09/2018  . History of bronchitis   . History of hiatal hernia   . History of kidney stones 03/09/2018  . HSV infection    pos I and II  . Hypothyroidism   . Internal and external hemorrhoids without complication 25/09/3974   noted on colonoscopy   . Lymphedema    Left breast  . Macular degeneration of both eyes   . Osteopenia   . Osteoporosis 1998   Off Fosomax 2008, Prolia 03/17/11, 09/14/11, 03/16/12  . Persistent dry cough   . Personal history of radiation therapy 2017   Left Breast Cancer  . PMB (postmenopausal bleeding)   . PONV (postoperative nausea and vomiting)   . Pulmonary nodule 11/13/2015   10 to 11 mm subpleural pulmonary nodules in the right middle and right upper lobes   . Shoulder impingement, right 11/22/2016  . Vitamin D deficiency   . Wears glasses     Past Surgical History:  Procedure Laterality Date  . AUGMENTATION MAMMAPLASTY  1981  . BREAST IMPLANT REMOVAL  1982  . BREAST LUMPECTOMY WITH  RADIOACTIVE SEED AND SENTINEL LYMPH NODE BIOPSY Left 02/11/2016   Procedure: LEFT BREAST LUMPECTOMY WITH RADIOACTIVE SEED AND LEFT SENTINEL LYMPH NODE BIOPSY;  Surgeon: [name], MD;  Location: [facility];  Service: General;  Laterality: Left;  . COLONOSCOPY  10/04   Dr Watt Climes  . COLONOSCOPY  04/07/08   normal - Dr. Watt Climes  . COLONOSCOPY  04/08/2013  . DILATATION & CURETTAGE/HYSTEROSCOPY WITH MYOSURE N/A 03/19/2018   Procedure: DILATATION & CURETTAGE/HYSTEROSCOPY;  Surgeon: Salvadore Dom, MD;  Location: [facility];  Service: Gynecology;  Laterality: N/A;  . ELBOW SURGERY Right 12/1997  . ESOPHAGOGASTRODUODENOSCOPY ENDOSCOPY  08/2012   [place] hernia with GERD  . EXCISION OF BREAST BIOPSY Right 10/19/2017   Procedure: EXCISIONAL BIOPSY OF RIGHT BREAST;  Surgeon: [name], MD;  Location: [facility];  Service: General;  Laterality: Right;  . FOOT NEUROMA SURGERY Left 2004?  . TONSILLECTOMY AND ADENOIDECTOMY    . TUBAL LIGATION  1981    Current Outpatient Medications  Medication Sig Dispense Refill  . B Complex Vitamins (B COMPLEX-B12 PO) Take by mouth daily.    Marland Kitchen Bioflavonoid Products (C COMPLEX PO) Take by mouth 1 day or 1 dose.    . Calcium Carbonate (CALCIUM 600 PO) Take by mouth daily.    . Carboxymethylcellulose Sod PF (RETAINE CMC) 0.5 % SOLN Apply 1 drop to eye 4 (four) times daily as needed.    . Cholecalciferol (VITAMIN D3) 5000 units CAPS Take 1 capsule by mouth daily.    Marland Kitchen denosumab (PROLIA) 60 MG/ML SOLN injection Inject 60 mg into the skin every 6 (six) months. Administer in upper arm, thigh, or abdomen    . famotidine (PEPCID AC) 10 MG tablet Take 10 mg by mouth 2 (two) times daily.    . Ferrous Sulfate (IRON) 28 MG TABS Take 28 mg by mouth daily. Mon, Wed, and Fridays only    . Multiple Vitamin (MULTI-VITAMIN PO) Take by mouth 2 (two) times daily as needed. Women's 50 +    . Multiple Vitamins-Minerals (ICAPS AREDS 2) CAPS Take by mouth 2 (two) times daily.    . RESTASIS 0.05 % ophthalmic emulsion Place 1 drop into both eyes 2 (two) times daily.     Marland Kitchen SYNTHROID 100 MCG tablet Take 1 tablet by mouth daily. Take 1 tablet daily except Wednesday. On Wednesday take 1/2 tablet.    . valACYclovir (VALTREX) 500 MG tablet Take one tablet a day, can increase 1 tablet BID for 3 days if she has an outbreak. 90 tablet 4   No current facility-administered medications for this visit.    Family History  Problem Relation Age of Onset  . Heart failure Mother   . Prostate cancer Father 74  . Liver cancer Father   . Colon polyps Sister   . Breast cancer Sister 67  . Breast cancer Sister 52  . Ovarian cancer Cousin        died at 45  . Breast cancer Sister 22       recurrance at 86  . Breast cancer Sister 69  . Colon polyps Sister    . Breast cancer Other        X 4 + age 72, 67 with recurrence, 64, 54  . Lung cancer Brother        smoked  . Breast cancer Paternal Aunt   . Prostate cancer Brother 25  . Melanoma Brother   . Colon cancer Other 50  She  is the youngest of 10 children, has lost 4 siblings. One sister has alzheimer's, 1 died with dementia, another sister with dementia.    Review of Systems  All other systems reviewed and are negative.   Exam:   BP 122/68   Pulse 69   Temp 98.2 F (36.8 C)   Ht 5' 1.75" (1.568 m)   Wt 121 lb (54.9 kg)   LMP 05/16/2000 (Approximate)   SpO2 97%   BMI 22.31 kg/m   Weight change: _0 @ Height:   Height: 5' 1.75" (156.8 cm)  Ht Readings from Last 3 Encounters:  11/07/19 5' 1.75" (1.568 m)  02/14/19 5' 2" (1.575 m)  10/29/18 5' 2" (1.575 m)    General appearance: alert, cooperative and appears stated age Head: Normocephalic, without obvious abnormality, atraumatic Neck: no adenopathy, supple, symmetrical, trachea midline and thyroid normal to inspection and palpation Lungs: clear to auscultation bilaterally Cardiovascular: regular rate and rhythm Breasts: normal appearance, no masses or tenderness Abdomen: soft, non-tender; non distended,  no masses,  no organomegaly Extremities: extremities normal, atraumatic, no cyanosis or edema Skin: Skin color, texture, turgor normal. No rashes or lesions Lymph nodes: Cervical, supraclavicular, and axillary nodes normal. No abnormal inguinal nodes palpated Neurologic: Grossly normal   Pelvic: External genitalia:  no lesions              Urethra:  normal appearing urethra with no masses, tenderness or lesions              Bartholins and Skenes: normal                 Vagina: atrophic appearing vagina with normal color and discharge, no lesions. Grade 2 cystocele, grade 1 uterine prolapse  Fitted with a #3 ring pessary with support. Felt comfortable in the office overall. Placed the non fitting pessary, #3,  little smaller and more comfortable.               Cervix: no lesions               Bimanual Exam:  Uterus:  normal size, contour, position, consistency, mobility, non-tender              Adnexa: no mass, fullness, tenderness               Rectovaginal: Confirms               Anus:  normal sphincter tone, no lesions  Terence Lux chaperoned for the exam.  A:  Well Woman with normal exam  Genital prolapse  H/O breast cancer  H/O osteoprosis    P:   No pap this year  Mammogram due in the fall  Discussed breast self exam  Discussed calcium and vit D intake  Colonoscopy UTD  Labs with primary  On Prolia, managed by primary  Fitted with #3 ring pessary with support, f/u early next week.

## 2019-11-06 ENCOUNTER — Other Ambulatory Visit: Payer: Self-pay

## 2019-11-07 ENCOUNTER — Encounter: Payer: Self-pay | Admitting: Obstetrics and Gynecology

## 2019-11-07 ENCOUNTER — Ambulatory Visit (INDEPENDENT_AMBULATORY_CARE_PROVIDER_SITE_OTHER): Payer: Medicare Other | Admitting: Obstetrics and Gynecology

## 2019-11-07 VITALS — BP 122/68 | HR 69 | Temp 98.2°F | Ht 61.75 in | Wt 121.0 lb

## 2019-11-07 DIAGNOSIS — N8111 Cystocele, midline: Secondary | ICD-10-CM | POA: Diagnosis not present

## 2019-11-07 DIAGNOSIS — Z853 Personal history of malignant neoplasm of breast: Secondary | ICD-10-CM

## 2019-11-07 DIAGNOSIS — Z8739 Personal history of other diseases of the musculoskeletal system and connective tissue: Secondary | ICD-10-CM | POA: Diagnosis not present

## 2019-11-07 DIAGNOSIS — Z01419 Encounter for gynecological examination (general) (routine) without abnormal findings: Secondary | ICD-10-CM | POA: Diagnosis not present

## 2019-11-07 DIAGNOSIS — N814 Uterovaginal prolapse, unspecified: Secondary | ICD-10-CM | POA: Diagnosis not present

## 2019-11-07 DIAGNOSIS — N3946 Mixed incontinence: Secondary | ICD-10-CM

## 2019-11-07 NOTE — Patient Instructions (Signed)
EXERCISE AND DIET:  We recommended that you start or continue a regular exercise program for good health. Regular exercise means any activity that makes your heart beat faster and makes you sweat.  We recommend exercising at least 30 minutes per day at least 3 days a week, preferably 4 or 5.  We also recommend a diet low in fat and sugar.  Inactivity, poor dietary choices and obesity can cause diabetes, heart attack, stroke, and kidney damage, among others.    ALCOHOL AND SMOKING:  Women should limit their alcohol intake to no more than 7 drinks/beers/glasses of wine (combined, not each!) per week. Moderation of alcohol intake to this level decreases your risk of breast cancer and liver damage. And of course, no recreational drugs are part of a healthy lifestyle.  And absolutely no smoking or even second hand smoke. Most people know smoking can cause heart and lung diseases, but did you know it also contributes to weakening of your bones? Aging of your skin?  Yellowing of your teeth and nails?  CALCIUM AND VITAMIN D:  Adequate intake of calcium and Vitamin D are recommended.  The recommendations for exact amounts of these supplements seem to change often, but generally speaking 1,200 mg of calcium (between diet and supplement) and 800 units of Vitamin D per day seems prudent. Certain women may benefit from higher intake of Vitamin D.  If you are among these women, your doctor will have told you during your visit.    PAP SMEARS:  Pap smears, to check for cervical cancer or precancers,  have traditionally been done yearly, although recent scientific advances have shown that most women can have pap smears less often.  However, every woman still should have a physical exam from her gynecologist every year. It will include a breast check, inspection of the vulva and vagina to check for abnormal growths or skin changes, a visual exam of the cervix, and then an exam to evaluate the size and shape of the uterus and  ovaries.  And after 72 years of age, a rectal exam is indicated to check for rectal cancers. We will also provide age appropriate advice regarding health maintenance, like when you should have certain vaccines, screening for sexually transmitted diseases, bone density testing, colonoscopy, mammograms, etc.   MAMMOGRAMS:  All women over 40 years old should have a yearly mammogram. Many facilities now offer a "3D" mammogram, which may cost around $50 extra out of pocket. If possible,  we recommend you accept the option to have the 3D mammogram performed.  It both reduces the number of women who will be called back for extra views which then turn out to be normal, and it is better than the routine mammogram at detecting truly abnormal areas.    COLON CANCER SCREENING: Now recommend starting at age 45. At this time colonoscopy is not covered for routine screening until 50. There are take home tests that can be done between 45-49.   COLONOSCOPY:  Colonoscopy to screen for colon cancer is recommended for all women at age 50.  We know, you hate the idea of the prep.  We agree, BUT, having colon cancer and not knowing it is worse!!  Colon cancer so often starts as a polyp that can be seen and removed at colonscopy, which can quite literally save your life!  And if your first colonoscopy is normal and you have no family history of colon cancer, most women don't have to have it again for   10 years.  Once every ten years, you can do something that may end up saving your life, right?  We will be happy to help you get it scheduled when you are ready.  Be sure to check your insurance coverage so you understand how much it will cost.  It may be covered as a preventative service at no cost, but you should check your particular policy.      Breast Self-Awareness Breast self-awareness means being familiar with how your breasts look and feel. It involves checking your breasts regularly and reporting any changes to your  health care provider. Practicing breast self-awareness is important. A change in your breasts can be a sign of a serious medical problem. Being familiar with how your breasts look and feel allows you to find any problems early, when treatment is more likely to be successful. All women should practice breast self-awareness, including women who have had breast implants. How to do a breast self-exam One way to learn what is normal for your breasts and whether your breasts are changing is to do a breast self-exam. To do a breast self-exam: Look for Changes  1. Remove all the clothing above your waist. 2. Stand in front of a mirror in a room with good lighting. 3. Put your hands on your hips. 4. Push your hands firmly downward. 5. Compare your breasts in the mirror. Look for differences between them (asymmetry), such as: ? Differences in shape. ? Differences in size. ? Puckers, dips, and bumps in one breast and not the other. 6. Look at each breast for changes in your skin, such as: ? Redness. ? Scaly areas. 7. Look for changes in your nipples, such as: ? Discharge. ? Bleeding. ? Dimpling. ? Redness. ? A change in position. Feel for Changes Carefully feel your breasts for lumps and changes. It is best to do this while lying on your back on the floor and again while sitting or standing in the shower or tub with soapy water on your skin. Feel each breast in the following way:  Place the arm on the side of the breast you are examining above your head.  Feel your breast with the other hand.  Start in the nipple area and make  inch (2 cm) overlapping circles to feel your breast. Use the pads of your three middle fingers to do this. Apply light pressure, then medium pressure, then firm pressure. The light pressure will allow you to feel the tissue closest to the skin. The medium pressure will allow you to feel the tissue that is a little deeper. The firm pressure will allow you to feel the tissue  close to the ribs.  Continue the overlapping circles, moving downward over the breast until you feel your ribs below your breast.  Move one finger-width toward the center of the body. Continue to use the  inch (2 cm) overlapping circles to feel your breast as you move slowly up toward your collarbone.  Continue the up and down exam using all three pressures until you reach your armpit.  Write Down What You Find  Write down what is normal for each breast and any changes that you find. Keep a written record with breast changes or normal findings for each breast. By writing this information down, you do not need to depend only on memory for size, tenderness, or location. Write down where you are in your menstrual cycle, if you are still menstruating. If you are having trouble noticing differences   in your breasts, do not get discouraged. With time you will become more familiar with the variations in your breasts and more comfortable with the exam. How often should I examine my breasts? Examine your breasts every month. If you are breastfeeding, the best time to examine your breasts is after a feeding or after using a breast pump. If you menstruate, the best time to examine your breasts is 5-7 days after your period is over. During your period, your breasts are lumpier, and it may be more difficult to notice changes. When should I see my health care provider? See your health care provider if you notice:  A change in shape or size of your breasts or nipples.  A change in the skin of your breast or nipples, such as a reddened or scaly area.  Unusual discharge from your nipples.  A lump or thick area that was not there before.  Pain in your breasts.  Anything that concerns you.  

## 2019-11-08 ENCOUNTER — Telehealth: Payer: Self-pay | Admitting: Obstetrics and Gynecology

## 2019-11-08 NOTE — Telephone Encounter (Signed)
Spoke with pt. Pt states having vaginal bleeding since having pessary placed at [facility] [place] on 11/07/19. This is new pessary due to genital prolapse.   Pt states vaginal bleeding started after coming home from running errands after OV and then last night when she changed her panty liner x 2 between dinner at 5pm to middle of night and again this am when she woke up. Pt states not filling panty liner, just changing to be clean every 4-5 hours. States more than a spot, but not half full in panty liner. Pt denies pain or being uncomfortable. States it did move when having BM last night and pt states pushed it back in place. Pt has h/o vaginal dryness, not on HRT due to h/o breast cancer.   Reviewed with Dr Talbert Nan. Pt ok to wait until follow up with Dr Talbert Nan on 11/12/19 if not saturating a pad or panty liner an hour or less and having no pain. Pt given recommendations and pt agreeable to monitor and will return call to office on Monday to be seen earlier for OV. Pt verbalized understanding and is agreeable.   Routing to Dr Talbert Nan for review.  Encounter closed.

## 2019-11-08 NOTE — Telephone Encounter (Signed)
Patient has a bloody discharge after pessary insertion yesterday.

## 2019-11-12 ENCOUNTER — Other Ambulatory Visit: Payer: Self-pay

## 2019-11-12 ENCOUNTER — Ambulatory Visit: Payer: Medicare Other | Admitting: Obstetrics and Gynecology

## 2019-11-12 ENCOUNTER — Ambulatory Visit (INDEPENDENT_AMBULATORY_CARE_PROVIDER_SITE_OTHER): Payer: Medicare Other | Admitting: Obstetrics and Gynecology

## 2019-11-12 ENCOUNTER — Encounter: Payer: Self-pay | Admitting: Obstetrics and Gynecology

## 2019-11-12 VITALS — BP 108/64 | HR 78 | Temp 98.4°F | Ht 61.75 in | Wt 121.8 lb

## 2019-11-12 DIAGNOSIS — Z853 Personal history of malignant neoplasm of breast: Secondary | ICD-10-CM

## 2019-11-12 DIAGNOSIS — Z4689 Encounter for fitting and adjustment of other specified devices: Secondary | ICD-10-CM | POA: Diagnosis not present

## 2019-11-12 DIAGNOSIS — N952 Postmenopausal atrophic vaginitis: Secondary | ICD-10-CM | POA: Diagnosis not present

## 2019-11-12 NOTE — Progress Notes (Signed)
GYNECOLOGY  VISIT   HPI: 74 y.o.   Divorced White or Caucasian Not Hispanic or Latino  female   [phone] with Patient's last menstrual period was 05/16/2000 (approximate).   here for pessary follow up. She was fitted for a pessary on Thursday, on Friday morning she had some spotting, by Friday afternoon the spotting had stopped. She says that she feels like she is doing well.  In general she doesn't feel the pessary unless she strains with BM (then she pushed it up), she did have slight discomfort vaginally when she was straining. The prolapse has been well controlled. Not typically constipated, her BM's other than today have been okay. She has a h/o mixed incontinence, slight increase in leakage, tolerable so far.   GYNECOLOGIC HISTORY: Patient's last menstrual period was 05/16/2000 (approximate). 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   Genetic testing 03/22/2016   Family history of breast cancer    Family history of ovarian cancer    Family history of prostate cancer    Breast cancer of upper-outer quadrant of left female breast (HCC) 01/20/2016   Bronchiectasis without acute exacerbation (HCC)  with MPNs 12/11/2015    Past Medical History:  Diagnosis Date   Anemia    Aortic atherosclerosis (HCC) 12/23/2016   Noted on CT   Arthritis    BRCA gene mutation negative 03/2016   Breast cancer (HCC) 2017   Left Breast Cancer   Breast cancer in female [facility]) 12/2015   left breast cancer, Estrogen negative, progesterone slight positive    Bronchiectasis (HCC)    Complication of anesthesia    Dry eye syndrome    Dyslipidemia    Dyspnea    Early cataract    Endometrial polyp    Family history of breast cancer    Family history of ovarian cancer    Family history of prostate cancer    Gall stones    GERD (gastroesophageal reflux disease)    Hematuria 03/09/2018   History of bronchitis    History of hiatal hernia    History of kidney stones 03/09/2018   HSV infection    pos I and II   Hypothyroidism    Internal and external hemorrhoids without complication 04/08/2013   noted on colonoscopy    Lymphedema    Left breast   Macular degeneration of both eyes    Osteopenia    Osteoporosis 1998   Off Fosomax 2008, Prolia 03/17/11, 09/14/11, 03/16/12   Persistent dry cough    Personal history of radiation therapy 2017   Left Breast Cancer   PMB (postmenopausal bleeding)    PONV (postoperative nausea and vomiting)    Pulmonary nodule 11/13/2015   10 to 11 mm subpleural pulmonary nodules in the right middle and right upper lobes    Shoulder impingement, right  11/22/2016   Vitamin D deficiency    Wears glasses     Past Surgical History:  Procedure Laterality Date   AUGMENTATION MAMMAPLASTY  1981   BREAST IMPLANT REMOVAL  1982   BREAST LUMPECTOMY WITH RADIOACTIVE SEED AND SENTINEL LYMPH NODE BIOPSY Left 02/11/2016   Procedure: LEFT BREAST LUMPECTOMY WITH RADIOACTIVE SEED AND LEFT SENTINEL LYMPH NODE BIOPSY;  Surgeon: [name], MD;  Location: [facility];  Service: General;  Laterality: Left;   COLONOSCOPY  10/04   Dr Watt Climes   COLONOSCOPY  04/07/08   normal - Dr. Watt Climes   COLONOSCOPY  04/08/2013   DILATATION & CURETTAGE/HYSTEROSCOPY WITH MYOSURE N/A 03/19/2018   Procedure: DILATATION & CURETTAGE/HYSTEROSCOPY;  Surgeon: Salvadore Dom, MD;  Location: [facility];  Service: Gynecology;  Laterality: N/A;   ELBOW SURGERY Right 12/1997   ESOPHAGOGASTRODUODENOSCOPY  ENDOSCOPY  08/2012   [place] hernia with GERD   EXCISION OF BREAST BIOPSY Right 10/19/2017   Procedure: EXCISIONAL BIOPSY OF RIGHT BREAST;  Surgeon: [name], MD;  Location: [facility];  Service: General;  Laterality: Right;   FOOT NEUROMA SURGERY Left 2004?   TONSILLECTOMY AND ADENOIDECTOMY     TUBAL LIGATION  1981    Current Outpatient Medications  Medication Sig Dispense Refill   B Complex Vitamins (B COMPLEX-B12 PO) Take by mouth daily.     Bioflavonoid Products (C COMPLEX PO) Take by mouth 1 day or 1 dose.     Calcium Carbonate (CALCIUM 600 PO) Take by mouth daily.     Carboxymethylcellulose Sod PF (RETAINE CMC) 0.5 % SOLN Apply 1 drop to eye 4 (four) times daily as needed.     Cholecalciferol (VITAMIN D3) 5000 units CAPS Take 1 capsule by mouth daily.     denosumab (PROLIA) 60 MG/ML SOLN injection Inject 60 mg into the skin every 6 (six) months. Administer in upper arm, thigh, or abdomen     famotidine (PEPCID AC) 10 MG tablet Take 10 mg by mouth 2 (two) times daily.     Ferrous Sulfate (IRON) 28 MG TABS Take 28 mg by mouth daily. Mon, Wed, and Fridays only     Multiple Vitamin (MULTI-VITAMIN PO) Take by mouth 2 (two) times daily as needed. Women's 50 +     Multiple Vitamins-Minerals (ICAPS AREDS 2) CAPS Take by mouth 2 (two) times daily.     RESTASIS 0.05 % ophthalmic emulsion Place 1 drop into both eyes 2 (two) times daily.      SYNTHROID 100 MCG tablet Take 1 tablet by mouth daily. Take 1 tablet daily except Wednesday. On Wednesday take 1/2 tablet.     valACYclovir (VALTREX) 500 MG tablet Take one tablet a day, can increase 1 tablet BID for 3 days if she has an outbreak. 90 tablet 4   No current facility-administered medications for this visit.     ALLERGIES: Patient has no known allergies.  Family History  Problem Relation Age of Onset   Heart failure Mother    Prostate cancer Father 36   Liver cancer Father    Colon polyps Sister     Breast cancer Sister 84   Breast cancer Sister 40   Ovarian cancer Cousin        died at 53   Breast cancer Sister 35       recurrance at 90   Breast cancer Sister 29   Colon polyps Sister    Breast cancer Other        X  4 + age 60, 73 with recurrence, 61, 6   Lung cancer Brother        smoked   Breast cancer Paternal Aunt    Prostate cancer Brother 67   Melanoma Brother    Colon cancer Other 24    Social History   Socioeconomic History   Marital status: Divorced    Spouse name: Not on file   Number of children: 1   Years of education: Not on file   Highest education level: Not on file  Occupational History   Not on file  Tobacco Use   Smoking status: Never Smoker   Smokeless tobacco: Never Used  Vaping Use   Vaping Use: Never used  Substance and Sexual Activity   Alcohol use: No   Drug use: No   Sexual activity: Not Currently    Partners: Male    Birth control/protection: Post-menopausal  Other Topics Concern   Not on file  Social History Narrative   Not on file   Social Determinants of Health   Financial Resource Strain:    Difficulty of Paying Living Expenses:   Food Insecurity:    Worried About Charity fundraiser in the Last Year:    Arboriculturist in the Last Year:   Transportation Needs:    Film/video editor (Medical):    Lack of Transportation (Non-Medical):   Physical Activity:    Days of Exercise per Week:    Minutes of Exercise per Session:   Stress:    Feeling of Stress :   Social Connections:    Frequency of Communication with Friends and Family:    Frequency of Social Gatherings with Friends and Family:    Attends Religious Services:    Active Member of Clubs or Organizations:    Attends Music therapist:    Marital Status:   Intimate Partner Violence:    Fear of Current or Ex-Partner:    Emotionally Abused:    Physically Abused:    Sexually Abused:     Review of  Systems  All other systems reviewed and are negative.   PHYSICAL EXAMINATION:    BP 108/64    Pulse 78    Temp 98.4 F (36.9 C)    Ht 5' 1.75" (1.568 m)    Wt 121 lb 12.8 oz (55.2 kg)    LMP 05/16/2000 (Approximate)    SpO2 99%    BMI 22.46 kg/m     General appearance: alert, cooperative and appears stated age  Pelvic: External genitalia:  no lesions              Urethra:  normal appearing urethra with no masses, tenderness or lesions              Bartholins and Skenes: normal                 Vagina: the pessary was removed and cleaned, atrophic vaginal mucosa, one spot of irritation near the introitus. No upper vaginal irritation.               Cervix: no lesions                Chaperone was present for exam.  ASSESSMENT Pessary check, overall doing well. The pessary does move down with straining, but overall comfortable (the bigger size was previously uncomfortable) Vaginal atrophy, upper vagina is not irritated from the pessary H/O breast cancer, not a good candidate for vaginal estrogen  PLAN  Pessary replaced Start Trimosan cream, small amount 2 x a week F/U in one month Try to avoid straining, discussed how to deal with her pessary if it moves.     ~20 minutes in total patient care

## 2019-12-19 ENCOUNTER — Other Ambulatory Visit: Payer: Self-pay

## 2019-12-19 ENCOUNTER — Encounter: Payer: Self-pay | Admitting: Obstetrics and Gynecology

## 2019-12-19 ENCOUNTER — Ambulatory Visit (INDEPENDENT_AMBULATORY_CARE_PROVIDER_SITE_OTHER): Payer: Medicare Other | Admitting: Obstetrics and Gynecology

## 2019-12-19 VITALS — BP 110/60 | HR 71 | Ht 61.75 in | Wt 119.8 lb

## 2019-12-19 DIAGNOSIS — N3946 Mixed incontinence: Secondary | ICD-10-CM

## 2019-12-19 DIAGNOSIS — Z4689 Encounter for fitting and adjustment of other specified devices: Secondary | ICD-10-CM | POA: Diagnosis not present

## 2019-12-19 DIAGNOSIS — N952 Postmenopausal atrophic vaginitis: Secondary | ICD-10-CM

## 2019-12-19 DIAGNOSIS — Z853 Personal history of malignant neoplasm of breast: Secondary | ICD-10-CM

## 2019-12-19 NOTE — Patient Instructions (Addendum)
Try replens vaginal moisturizer    Urinary Incontinence  Urinary incontinence refers to a condition in which a person is unable to control where and when to pass urine. A person with this condition will urinate when he or she does not mean to (involuntarily). What are the causes? This condition may be caused by:  Medicines.  Infections.  Constipation.  Overactive bladder muscles.  Weak bladder muscles.  Weak pelvic floor muscles. These muscles provide support for the bladder, intestine, and, in women, the uterus.  Enlarged prostate in men. The prostate is a gland near the bladder. When it gets too big, it can pinch the urethra. With the urethra blocked, the bladder can weaken and lose the ability to empty properly.  Surgery.  Emotional factors, such as anxiety, stress, or post-traumatic stress disorder (PTSD).  Pelvic organ prolapse. This happens in women when organs shift out of place and into the vagina. This shift can prevent the bladder and urethra from working properly. What increases the risk? The following factors may make you more likely to develop this condition:  Older age.  Obesity and physical inactivity.  Pregnancy and childbirth.  Menopause.  Diseases that affect the nerves or spinal cord (neurological diseases).  Long-term (chronic) coughing. This can increase pressure on the bladder and pelvic floor muscles. What are the signs or symptoms? Symptoms may vary depending on the type of urinary incontinence you have. They include:  A sudden urge to urinate, but passing urine involuntarily before you can get to a bathroom (urge incontinence).  Suddenly passing urine with any activity that forces urine to pass, such as coughing, laughing, exercise, or sneezing (stress incontinence).  Needing to urinate often, but urinating only a small amount, or constantly dribbling urine (overflow incontinence).  Urinating because you cannot get to the bathroom in time  due to a physical disability, such as arthritis or injury, or communication and thinking problems, such as Alzheimer disease (functional incontinence). How is this diagnosed? This condition may be diagnosed based on:  Your medical history.  A physical exam.  Tests, such as: ? Urine tests. ? X-rays of your kidney and bladder. ? Ultrasound. ? CT scan. ? Cystoscopy. In this procedure, a health care provider inserts a tube with a light and camera (cystoscope) through the urethra and into the bladder in order to check for problems. ? Urodynamic testing. These tests assess how well the bladder, urethra, and sphincter can store and release urine. There are different types of urodynamic tests, and they vary depending on what the test is measuring. To help diagnose your condition, your health care provider may recommend that you keep a log of when you urinate and how much you urinate. How is this treated? Treatment for this condition depends on the type of incontinence that you have and its cause. Treatment may include:  Lifestyle changes, such as: ? Quitting smoking. ? Maintaining a healthy weight. ? Staying active. Try to get 150 minutes of moderate-intensity exercise every week. Ask your health care provider which activities are safe for you. ? Eating a healthy diet.  Avoid high-fat foods, like fried foods.  Avoid refined carbohydrates like white bread and white rice.  Limit how much alcohol and caffeine you drink.  Increase your fiber intake. Foods such as fresh fruits, vegetables, beans, and whole grains are healthy sources of fiber.  Pelvic floor muscle exercises.  Bladder training, such as lengthening the amount of time between bathroom breaks, or using the bathroom at regular intervals.  Using techniques to suppress bladder urges. This can include distraction techniques or controlled breathing exercises.  Medicines to relax the bladder muscles and prevent bladder  spasms.  Medicines to help slow or prevent the growth of a man's prostate.  Botox injections. These can help relax the bladder muscles.  Using pulses of electricity to help change bladder reflexes (electrical nerve stimulation).  For women, using a medical device to prevent urine leaks. This is a small, tampon-like, disposable device that is inserted into the urethra.  Injecting collagen or carbon beads (bulking agents) into the urinary sphincter. These can help thicken tissue and close the bladder opening.  Surgery. Follow these instructions at home: Lifestyle  Limit alcohol and caffeine. These can fill your bladder quickly and irritate it.  Keep yourself clean to help prevent odors and skin damage. Ask your doctor about special skin creams and cleansers that can protect the skin from urine.  Consider wearing pads or adult diapers. Make sure to change them regularly, and always change them right after experiencing incontinence. General instructions  Take over-the-counter and prescription medicines only as told by your health care provider.  Use the bathroom about every 3-4 hours, even if you do not feel the need to urinate. Try to empty your bladder completely every time. After urinating, wait a minute. Then try to urinate again.  Make sure you are in a relaxed position while urinating.  If your incontinence is caused by nerve problems, keep a log of the medicines you take and the times you go to the bathroom.  Keep all follow-up visits as told by your health care provider. This is important. Contact a health care provider if:  You have pain that gets worse.  Your incontinence gets worse. Get help right away if:  You have a fever or chills.  You are unable to urinate.  You have redness in your groin area or down your legs. Summary  Urinary incontinence refers to a condition in which a person is unable to control where and when to pass urine.  This condition may be  caused by medicines, infection, weak bladder muscles, weak pelvic floor muscles, enlargement of the prostate (in men), or surgery.  The following factors increase your risk for developing this condition: older age, obesity, pregnancy and childbirth, menopause, neurological diseases, and chronic coughing.  There are several types of urinary incontinence. They include urge incontinence, stress incontinence, overflow incontinence, and functional incontinence.  This condition is usually treated first with lifestyle and behavioral changes, such as quitting smoking, eating a healthier diet, and doing regular pelvic floor exercises. Other treatment options include medicines, bulking agents, medical devices, electrical nerve stimulation, or surgery. This information is not intended to replace advice given to you by your health care provider. Make sure you discuss any questions you have with your health care provider. Document Revised: 05/12/2017 Document Reviewed: 08/11/2016 Elsevier Patient Education  [place].

## 2019-12-19 NOTE — Progress Notes (Signed)
GYNECOLOGY  VISIT   HPI: 74 y.o.   Divorced White or Caucasian Not Hispanic or Latino  female   [phone] with Patient's last menstrual period was 05/16/2000 (approximate).   here for one month follow up pessary maintenance  She states that she has had more leakage. She states that when she has a bowel movement her bladder pushes out around the pessary. She states that after her bladder came out around the pessary she states that she would have more leakage.  She was fitted with a #3 ring pessary with support at the annual exam in 6/21.   H/o mixed incontinence, notices worsening incontinence mainly when she bends. She leaks a small amount of urine on the way to the bathroom daily. Leaks a little with coughing or sneezing. She changes her mini-pad a few times a day. When she is having her BM, the bladder occasionally goes around the pessary. Otherwise doesn't notice the prolapse. Not uncomfortable. She had some bleeding when she tried to take the pessary out.   H/O breast cancer. She was started on trimosan cream 2 x a week at her f/u pessary fitting at the end of June, 2021.   GYNECOLOGIC HISTORY: Patient's last menstrual period was 05/16/2000 (approximate). Contraception:PMP Menopausal hormone therapy: none        OB History    Gravida  2   Para  2   Term  2   Preterm  0   AB  0   Living  1     SAB  0   TAB  0   Ectopic  0   Multiple  0   Live Births  1              Patient Active Problem List   Diagnosis Date Noted  . Adult hypothyroidism 01/01/2018  . Avitaminosis D 01/01/2018  . Dyslipidemia 01/01/2018  . OP (osteoporosis) 01/01/2018  . 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 Genetic testing 03/22/2016  . Family history of breast cancer   . Family history of ovarian cancer   . Family history of prostate cancer   . Breast cancer of upper-outer quadrant of left female breast ([place]) 01/20/2016  . Bronchiectasis without acute exacerbation ([place])   with MPNs 12/11/2015    Past Medical History:  Diagnosis Date  . Anemia   . Aortic atherosclerosis ([place]) 12/23/2016   Noted on CT  . Arthritis   . BRCA gene mutation negative 03/2016  . Breast cancer ([place]) 2017   Left Breast Cancer  . Breast cancer in female [facility]) 12/2015   left breast cancer, Estrogen negative, progesterone slight positive  . Bronchiectasis ([place])   . Complication of anesthesia   . Dry eye syndrome   . Dyslipidemia   . Dyspnea   . Early cataract   . Endometrial polyp   . Family history of breast cancer   . Family history of ovarian cancer   . Family history of prostate cancer   . Gall stones   . GERD (gastroesophageal reflux disease)   . Hematuria 03/09/2018  . History of bronchitis   . History of hiatal hernia   . History of kidney stones 03/09/2018  . HSV infection    pos I and II  . Hypothyroidism   . Internal and external hemorrhoids without complication 35/57/3220   noted on colonoscopy   . Lymphedema    Left breast  . Macular degeneration of both eyes   . Osteopenia   .  Osteoporosis 1998   Off Fosomax 2008, Prolia 03/17/11, 09/14/11, 03/16/12  . Persistent dry cough   . Personal history of radiation therapy 2017   Left Breast Cancer  . PMB (postmenopausal bleeding)   . PONV (postoperative nausea and vomiting)   . Pulmonary nodule 11/13/2015   10 to 11 mm subpleural pulmonary nodules in the right middle and right upper lobes   . Shoulder impingement, right 11/22/2016  . Vitamin D deficiency   . Wears glasses     Past Surgical History:  Procedure Laterality Date  . AUGMENTATION MAMMAPLASTY  1981  . BREAST IMPLANT REMOVAL  1982  . BREAST LUMPECTOMY WITH RADIOACTIVE SEED AND SENTINEL LYMPH NODE BIOPSY Left 02/11/2016   Procedure: LEFT BREAST LUMPECTOMY WITH RADIOACTIVE SEED AND LEFT SENTINEL LYMPH NODE BIOPSY;  Surgeon: [name], MD;  Location: [facility];  Service: General;  Laterality: Left;  . COLONOSCOPY  10/04   Dr Watt Climes  . COLONOSCOPY   04/07/08   normal - Dr. Watt Climes  . COLONOSCOPY  04/08/2013  . DILATATION & CURETTAGE/HYSTEROSCOPY WITH MYOSURE N/A 03/19/2018   Procedure: DILATATION & CURETTAGE/HYSTEROSCOPY;  Surgeon: Salvadore Dom, MD;  Location: [facility];  Service: Gynecology;  Laterality: N/A;  . ELBOW SURGERY Right 12/1997  . ESOPHAGOGASTRODUODENOSCOPY ENDOSCOPY  08/2012   [place] hernia with GERD  . EXCISION OF BREAST BIOPSY Right 10/19/2017   Procedure: EXCISIONAL BIOPSY OF RIGHT BREAST;  Surgeon: [name], MD;  Location: [facility];  Service: General;  Laterality: Right;  . FOOT NEUROMA SURGERY Left 2004?  . TONSILLECTOMY AND ADENOIDECTOMY    . TUBAL LIGATION  1981    Current Outpatient Medications  Medication Sig Dispense Refill  . B Complex Vitamins (B COMPLEX-B12 PO) Take by mouth daily.    Marland Kitchen Bioflavonoid Products (C COMPLEX PO) Take by mouth 1 day or 1 dose.    . Calcium Carbonate (CALCIUM 600 PO) Take by mouth daily.    . Carboxymethylcellulose Sod PF (RETAINE CMC) 0.5 % SOLN Apply 1 drop to eye 4 (four) times daily as needed.    . Cholecalciferol (VITAMIN D3) 5000 units CAPS Take 1 capsule by mouth daily.    Marland Kitchen denosumab (PROLIA) 60 MG/ML SOLN injection Inject 60 mg into the skin every 6 (six) months. Administer in upper arm, thigh, or abdomen    . famotidine (PEPCID AC) 10 MG tablet Take 10 mg by mouth 2 (two) times daily.    . Ferrous Sulfate (IRON) 28 MG TABS Take 28 mg by mouth daily. Mon, Wed, and Fridays only    . Multiple Vitamin (MULTI-VITAMIN PO) Take by mouth 2 (two) times daily as needed. Women's 50 +    . Multiple Vitamins-Minerals (ICAPS AREDS 2) CAPS Take by mouth 2 (two) times daily.    . RESTASIS 0.05 % ophthalmic emulsion Place 1 drop into both eyes 2 (two) times daily.     Marland Kitchen SYNTHROID 100 MCG tablet Take 1 tablet by mouth daily. Take 1 tablet daily except Wednesday. On Wednesday take 1/2 tablet.    . valACYclovir (VALTREX) 500 MG tablet Take one tablet  a day, can increase 1 tablet BID for 3 days if she has an outbreak. 90 tablet 4   No current facility-administered medications for this visit.     ALLERGIES: Patient has no known allergies.  Family History  Problem Relation Age of Onset  . Heart failure Mother   . Prostate cancer Father 44  . Liver cancer Father   .  Colon polyps Sister   . Breast cancer Sister 49  . Breast cancer Sister 55  . Ovarian cancer Cousin        died at 46  . Breast cancer Sister 48       recurrance at 49  . Breast cancer Sister 6  . Colon polyps Sister   . Breast cancer Other        X 4 + age 62, 67 with recurrence, 59, 51  . Lung cancer Brother        smoked  . Breast cancer Paternal Aunt   . Prostate cancer Brother 60  . Melanoma Brother   . Colon cancer Other 54    Social History   Socioeconomic History  . Marital status: Divorced    Spouse name: Not on file  . Number of children: 1  . Years of education: Not on file  . Highest education level: Not on file  Occupational History  . Not on file  Tobacco Use  . Smoking status: Never Smoker  . Smokeless tobacco: Never Used  Vaping Use  . Vaping Use: Never used  Substance and Sexual Activity  . Alcohol use: No  . Drug use: No  . Sexual activity: Not Currently    Partners: Male    Birth control/protection: Post-menopausal  Other Topics Concern  . Not on file  Social History Narrative  . Not on file   Social Determinants of Health   Financial Resource Strain:   . Difficulty of Paying Living Expenses:   Food Insecurity:   . Worried About Charity fundraiser in the Last Year:   . Arboriculturist in the Last Year:   Transportation Needs:   . Film/video editor (Medical):   Marland Kitchen Lack of Transportation (Non-Medical):   Physical Activity:   . Days of Exercise per Week:   . Minutes of Exercise per Session:   Stress:   . Feeling of Stress :   Social Connections:   . Frequency of Communication with Friends and Family:   .  Frequency of Social Gatherings with Friends and Family:   . Attends Religious Services:   . Active Member of Clubs or Organizations:   . Attends Archivist Meetings:   Marland Kitchen Marital Status:   Intimate Partner Violence:   . Fear of Current or Ex-Partner:   . Emotionally Abused:   Marland Kitchen Physically Abused:   . Sexually Abused:     Review of Systems  All other systems reviewed and are negative.   PHYSICAL EXAMINATION:    LMP 05/16/2000 (Approximate)     General appearance: alert, cooperative and appears stated age  Pelvic: External genitalia:  no lesions              Urethra:  normal appearing urethra with no masses, tenderness or lesions              Bartholins and Skenes: normal                 Vagina: the pessary was removed and cleaned. Atrophic vaginal mucosa without any focal irritation. The pessary was left out. She is uncomfortable with removal of the pessary.               Cervix:  no lesions  Chaperone was present for exam.  ASSESSMENT Pessary f/u. She is having some movement of the pessary with BM Worsening incontinence with the pessary, mostly with bending     PLAN Discussed  that the pessary moving is c/w it being too small. We discussed trying a bigger pessary. We also discussed trying an incontinence dish with support. At the moment she wants to leave the pessary out.  Recommended she try Replens for vaginal dryness and see how she feels.    An After Visit Summary was printed and given to the patient.

## 2019-12-22 ENCOUNTER — Encounter: Payer: Self-pay | Admitting: Obstetrics and Gynecology

## 2020-01-03 ENCOUNTER — Other Ambulatory Visit: Payer: Self-pay | Admitting: Adult Health

## 2020-01-03 DIAGNOSIS — Z9889 Other specified postprocedural states: Secondary | ICD-10-CM

## 2020-01-14 ENCOUNTER — Other Ambulatory Visit: Payer: Self-pay | Admitting: Family Medicine

## 2020-01-14 DIAGNOSIS — M810 Age-related osteoporosis without current pathological fracture: Secondary | ICD-10-CM

## 2020-02-06 ENCOUNTER — Ambulatory Visit
Admission: RE | Admit: 2020-02-06 | Discharge: 2020-02-06 | Disposition: A | Discharge status: Home / Self Care | Payer: Medicare Other | Source: Ambulatory Visit | Attending: Adult Health | Admitting: Adult Health

## 2020-02-06 ENCOUNTER — Other Ambulatory Visit: Payer: Self-pay

## 2020-02-06 DIAGNOSIS — Z9889 Other specified postprocedural states: Secondary | ICD-10-CM

## 2020-02-13 ENCOUNTER — Telehealth: Payer: Self-pay | Admitting: Obstetrics and Gynecology

## 2020-02-13 ENCOUNTER — Other Ambulatory Visit: Payer: Self-pay

## 2020-02-13 MED ORDER — VALACYCLOVIR HCL 500 MG PO TABS
ORAL_TABLET | ORAL | 4 refills | Status: DC
Start: 2020-02-13 — End: 2020-02-13

## 2020-02-13 MED ORDER — VALACYCLOVIR HCL 500 MG PO TABS: ORAL_TABLET | ORAL | 4 refills | Status: DC

## 2020-02-13 NOTE — Telephone Encounter (Signed)
Spoke with pt. Pt given update per Dr Talbert Nan. Pt agreeable.  Encounter closed.

## 2020-02-13 NOTE — Telephone Encounter (Signed)
Spoke with pt. Pt states having outbreaks about once a month. Pt states requested Valtrex Rx since had 1 pill left. Pt states has been taking daily, but since only having 1 outbreak a month, states would be ok to take PRN with outbreaks. Advised pt will review with Dr Talbert Nan and return call with recommendations, pt agreeable.   Pt also needing Rx sent to [facility] per request due to insurance. New Rx sent to [facility], RX at [facility] cancelled.   Advised pt to get copy of recent labs from PCP sent to office fax. Fax number given. Pt states had labs with Dr Dema Severin in 12/2019.  Routing to Dr Talbert Nan, please advise.

## 2020-02-13 NOTE — Telephone Encounter (Signed)
Patient is calling for a refill of Valtrex. Patient would like it sent through [facility].

## 2020-02-13 NOTE — Telephone Encounter (Signed)
If she is having monthly outbreaks on suppression, she should stay on suppression. It is likely to get worse off of suppression.

## 2020-02-13 NOTE — Telephone Encounter (Signed)
Medication refill request: Valtrex Last AEX:  11/07/19 JJ Next AEX: 11/12/20 Last MMG (if hormonal medication request): n/a Refill authorized: Today, please advise

## 2020-02-13 NOTE — Telephone Encounter (Signed)
I just refilled her valtrex for the year. Please ask her to get a copy of her labs from her primary. Also ask her if she is getting HSV outbreaks? I'm wondering if she can take the valtrex as needed instead of daily.

## 2020-02-16 NOTE — Progress Notes (Signed)
[facility]  [phone]) [phone] Fax:(336) [phone]     ID: CHANTA BAUERS DOB: 1946-03-30  MR#: [id number]  JYN#:[id number]  Patient Care Team: Harlan Stains, MD as PCP - General (Family Medicine) [name], MD as Consulting Physician (General Surgery) Kenyan Karnes, Virgie Dad, MD as Consulting Physician (Oncology) Kyung Rudd, MD as Consulting Physician (Radiation Oncology) Clarene Essex, MD as Consulting Physician (Gastroenterology) Salvadore Dom, MD as Consulting Physician (Obstetrics and Gynecology) OTHER MD:  CHIEF COMPLAINT: Ductal carcinoma in situ  CURRENT TREATMENT: Completed 4 years of tamoxifen   INTERVAL HISTORY: [place] returns today for follow up of her noninvasive breast cancer.    She stopped tamoxifen at her own discretion in February 2021.  She has not noted a significant change in symptoms--she had been tolerating it well.  She read somewhere that tamoxifen caused dementia and of course that is something she did not want so she discontinued the medication.  Since her last visit, she underwent bilateral diagnostic mammography with tomography at [facility] on 02/06/2020 showing: breast density category C; no evidence of malignancy in either breast.   She is scheduled for bone density screening on 05/01/2020.   REVIEW OF SYSTEMS: Amiyah is very busy with gardening.  She did get the [employer] vaccine x2.  Unfortunately the rest of her family has not been vaccinated.  They do visit and she very much wants to continue to visit with them since that she greatly enjoys the grandchildren.  Aside from gardening she likes to walk for exercise.  A detailed review of systems today was otherwise stable   BREAST CANCER HISTORY: From the original intake note:  Alvilda had bilateral screening mammography at the [facility] 01/06/2016, showing a possible mass in the left breast. On 01/13/2016 she had left diagnostic mammography with tomography and  left breast ultrasonography. The breast density was category C. In the upper outer quadrant of the left breast there was an area of asymmetry measuring 0.8 cm. On physical exam there was no palpable mass. Ultrasonography confirmed an irregular hypoechoic mass at the 2:00 position 6 cm from the nipple measuring 1.4 cm. Ultrasound of the left axilla was benign.  On 01/15/2016 the patient underwent biopsy of the left breast mass in question, and this showed ([id number]) ductal carcinoma in situ, grade 3, estrogen receptor negative, progesterone receptor questionably positive (20% with weak staining intensity).  Her subsequent history is as detailed below   PAST MEDICAL HISTORY: Past Medical History:  Diagnosis Date  . Anemia   . Aortic atherosclerosis ([place]) 12/23/2016   Noted on CT  . Arthritis   . BRCA gene mutation negative 03/2016  . Breast cancer ([place]) 2017   Left Breast Cancer  . Breast cancer in female [facility]) 12/2015   left breast cancer, Estrogen negative, progesterone slight positive  . Bronchiectasis ([place])   . Complication of anesthesia   . Dry eye syndrome   . Dyslipidemia   . Dyspnea   . Early cataract   . Endometrial polyp   . Family history of breast cancer   . Family history of ovarian cancer   . Family history of prostate cancer   . Gall stones   . GERD (gastroesophageal reflux disease)   . Hematuria 03/09/2018  . History of bronchitis   . History of hiatal hernia   . History of kidney stones 03/09/2018  . HSV infection    pos I and II  . Hypothyroidism   . Internal and external  hemorrhoids without complication 79/06/4095   noted on colonoscopy   . Lymphedema    Left breast  . Macular degeneration of both eyes   . Osteopenia   . Osteoporosis 1998   Off Fosomax 2008, Prolia 03/17/11, 09/14/11, 03/16/12  . Persistent dry cough   . Personal history of radiation therapy 2017   Left Breast Cancer  . PMB (postmenopausal bleeding)   . PONV (postoperative nausea and  vomiting)   . Pulmonary nodule 11/13/2015   10 to 11 mm subpleural pulmonary nodules in the right middle and right upper lobes   . Shoulder impingement, right 11/22/2016  . Vitamin D deficiency   . Wears glasses     PAST SURGICAL HISTORY: Past Surgical History:  Procedure Laterality Date  . AUGMENTATION MAMMAPLASTY  1981  . BREAST IMPLANT REMOVAL  1982  . BREAST LUMPECTOMY Left 02/11/2016  . BREAST LUMPECTOMY WITH RADIOACTIVE SEED AND SENTINEL LYMPH NODE BIOPSY Left 02/11/2016   Procedure: LEFT BREAST LUMPECTOMY WITH RADIOACTIVE SEED AND LEFT SENTINEL LYMPH NODE BIOPSY;  Surgeon: [name], MD;  Location: [facility];  Service: General;  Laterality: Left;  . COLONOSCOPY  10/04   Dr Watt Climes  . COLONOSCOPY  04/07/08   normal - Dr. Watt Climes  . COLONOSCOPY  04/08/2013  . DILATATION & CURETTAGE/HYSTEROSCOPY WITH MYOSURE N/A 03/19/2018   Procedure: DILATATION & CURETTAGE/HYSTEROSCOPY;  Surgeon: Salvadore Dom, MD;  Location: [facility];  Service: Gynecology;  Laterality: N/A;  . ELBOW SURGERY Right 12/1997  . ESOPHAGOGASTRODUODENOSCOPY ENDOSCOPY  08/2012   [place]ak hernia with GERD  . EXCISION OF BREAST BIOPSY Right 10/19/2017   Procedure: EXCISIONAL BIOPSY OF RIGHT BREAST;  Surgeon: [name], MD;  Location: [facility];  Service: General;  Laterality: Right;  . FOOT NEUROMA SURGERY Left 2004?  . TONSILLECTOMY AND ADENOIDECTOMY    . TUBAL LIGATION  1981    FAMILY HISTORY Family History  Problem Relation Age of Onset  . Heart failure Mother   . Prostate cancer Father 77  . Liver cancer Father   . Colon polyps Sister   . Breast cancer Sister 47  . Breast cancer Sister 30  . Ovarian cancer Cousin        died at 51  . Breast cancer Sister 53       recurrance at 63  . Breast cancer Sister 22  . Colon polyps Sister   . Breast cancer Other        X 4 + age 15, 41 with recurrence, 91, 31  . Lung cancer Brother        smoked  . Breast cancer Paternal  Aunt   . Prostate cancer Brother 68  . Melanoma Brother   . Colon cancer Other 4  The patient's father died at age 85. He had been diagnosed with prostate "and liver" cancer at the age of 59. The patient's mother died at age 56. The patient had 4 brothers, 5 sisters. One brother was diagnosed with lung cancer at age 19. She has 4 sisters with breast cancer, one initially diagnosed at age 33, recurring at age 69.   GYNECOLOGIC HISTORY:  Patient's last menstrual period was 05/16/2000 (approximate). Menarche age 63, first live birth age 11, the patient is GX P1. She stopped having periods approximately 2002. She used hormone replacement for approximately 5 years. 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 She is divorced and lives alone, with 2 cats. Her son Kassadie Pancake lives in [place]. His wife is a Cabin crew and he is her handyman. The patient has 2 grandsons. She attends a [employer] in the [place] area    ADVANCED DIRECTIVES: In place; the patient has named her son [name] as her healthcare part of attorney. He can be reached at [phone].   HEALTH MAINTENANCE: Social History   Tobacco Use  . Smoking status: Never Smoker  . Smokeless tobacco: Never Used  Vaping Use  . Vaping Use: Never used  Substance Use Topics  . Alcohol use: No  . Drug use: No     Colonoscopy: 2019?/[facility]  PAP:  Bone density: December 20 18/-2.7   No Known Allergies  Current Outpatient Medications  Medication Sig Dispense Refill  . B Complex Vitamins (B COMPLEX-B12 PO) Take by mouth daily.    Marland Kitchen Bioflavonoid Products (C COMPLEX PO) Take by mouth 1 day or 1 dose.    . Calcium Carbonate (CALCIUM 600 PO) Take by mouth daily.    . Carboxymethylcellulose Sod PF (RETAINE CMC) 0.5 % SOLN Apply 1 drop to eye 4 (four) times daily as needed.    . Cholecalciferol  (VITAMIN D3) 5000 units CAPS Take 1 capsule by mouth daily.    Marland Kitchen denosumab (PROLIA) 60 MG/ML SOLN injection Inject 60 mg into the skin every 6 (six) months. Administer in upper arm, thigh, or abdomen    . famotidine (PEPCID AC) 10 MG tablet Take 10 mg by mouth 2 (two) times daily.    . Ferrous Sulfate (IRON) 28 MG TABS Take 28 mg by mouth daily. Mon, Wed, and Fridays only    . Multiple Vitamin (MULTI-VITAMIN PO) Take by mouth 2 (two) times daily as needed. Women's 50 +    . Multiple Vitamins-Minerals (ICAPS AREDS 2) CAPS Take by mouth 2 (two) times daily.    . RESTASIS 0.05 % ophthalmic emulsion Place 1 drop into both eyes 2 (two) times daily.     Marland Kitchen SYNTHROID 100 MCG tablet Take 1 tablet by mouth daily. Take 1 tablet daily except Wednesday. On Wednesday take 1/2 tablet.    . valACYclovir (VALTREX) 500 MG tablet Take one tablet a day, can increase 1 tablet BID for 3 days if she has an outbreak. 90 tablet 4   No current facility-administered medications for this visit.    OBJECTIVE: White woman who appears stated age 89:   02/17/20 1257  BP: 140/81  Pulse: 67  Resp: 18  Temp: (!) 97.1 F (36.2 C)  SpO2: 99%     Body mass index is 22.59 kg/m.    ECOG FS:1 - Symptomatic but completely ambulatory  Sclerae unicteric, EOMs intact Wearing a mask No cervical or supraclavicular adenopathy Lungs no rales or rhonchi Heart regular rate and rhythm Abd soft, nontender, positive bowel sounds MSK no focal spinal tenderness, no upper extremity lymphedema Neuro: nonfocal, well oriented, appropriate affect Breasts: The right breast is unremarkable.  The left breast has undergone lumpectomy and radiation.  There is no evidence of local recurrence.  Both axillae are benign.   LAB RESULTS:  CMP     Component Value Date/Time   NA 143 03/19/2018 0913   NA 142 01/27/2016 1223   K 3.7 03/19/2018 0913   K 3.6 01/27/2016 1223   CL 108 02/09/2016 1328   CO2 25 02/09/2016 1328   CO2 27 01/27/2016  1223  GLUCOSE 96 03/19/2018 0913   GLUCOSE 131 01/27/2016 1223   BUN 14 02/09/2016 1328   BUN 14.8 01/27/2016 1223   CREATININE 0.73 02/09/2016 1328   CREATININE 0.8 01/27/2016 1223   CALCIUM 9.1 02/09/2016 1328   CALCIUM 9.1 01/27/2016 1223   PROT 6.6 01/27/2016 1223   ALBUMIN 3.6 01/27/2016 1223   AST 21 01/27/2016 1223   ALT 21 01/27/2016 1223   ALKPHOS 93 01/27/2016 1223   BILITOT 0.36 01/27/2016 1223   GFRNONAA >60 02/09/2016 1328   GFRAA >60 02/09/2016 1328    INo results found for: SPEP, UPEP  Lab Results  Component Value Date   WBC 10.9 (H) 02/09/2016   NEUTROABS 7.0 (H) 01/27/2016   HGB 13.6 03/19/2018   HCT 40.0 03/19/2018   MCV 96.1 02/09/2016   PLT 255 02/09/2016      Chemistry      Component Value Date/Time   NA 143 03/19/2018 0913   NA 142 01/27/2016 1223   K 3.7 03/19/2018 0913   K 3.6 01/27/2016 1223   CL 108 02/09/2016 1328   CO2 25 02/09/2016 1328   CO2 27 01/27/2016 1223   BUN 14 02/09/2016 1328   BUN 14.8 01/27/2016 1223   CREATININE 0.73 02/09/2016 1328   CREATININE 0.8 01/27/2016 1223      Component Value Date/Time   CALCIUM 9.1 02/09/2016 1328   CALCIUM 9.1 01/27/2016 1223   ALKPHOS 93 01/27/2016 1223   AST 21 01/27/2016 1223   ALT 21 01/27/2016 1223   BILITOT 0.36 01/27/2016 1223       No results found for: LABCA2  No components found for: LABCA125  No results for input(s): INR in the last 168 hours.  Urinalysis    Component Value Date/Time   BILIRUBINUR n 03/02/2018 1156   PROTEINUR Negative 03/02/2018 1156   UROBILINOGEN 0.2 03/02/2018 1156   NITRITE n 03/02/2018 1156   LEUKOCYTESUR Moderate (2+) (A) 03/02/2018 1156     STUDIES: MM DIAG BREAST TOMO BILATERAL  Result Date: 02/06/2020 CLINICAL DATA:  LEFT lumpectomy with radiation in September 2017. EXAM: DIGITAL DIAGNOSTIC BILATERAL MAMMOGRAM WITH TOMO AND CAD COMPARISON:  Previous exam(s). ACR Breast Density Category c: The breast tissue is heterogeneously dense,  which may obscure small masses. FINDINGS: Post operative changes are seen in the LEFTbreast. No suspicious mass, distortion, or microcalcifications are identified to suggest presence of malignancy. Mammographic images were processed with CAD. IMPRESSION: No mammographic evidence for malignancy. RECOMMENDATION: Diagnostic mammogram is suggested in 1 year. (Code:DM-B-01Y) I have discussed the findings and recommendations with the patient. If applicable, a reminder letter will be sent to the patient regarding the next appointment. BI-RADS CATEGORY  2: Benign. Electronically Signed   By: Nolon Nations M.D.   On: 02/06/2020 13:42    ELIGIBLE FOR AVAILABLE RESEARCH PROTOCOL: no  ASSESSMENT: 74 y.o. [place] woman status post left breast upper outer quadrant biopsy 01/15/2016 for ductal carcinoma in situ, grade 3, estrogen receptor negative, progesterone receptor questionably positive  (1) Status post left lumpectomy and sentinel lymph node sampling 02/11/2016 for a pTis pN0, stage 0 ductal carcinoma in situ, grade 3, with negative margins.  (2) adjuvant radiation 03/10/2016 through 04/06/2016 Site/dose:   The patient initially received a dose of 42.5 Gy in 17 fractions to the breast using whole-breast tangent fields. This was delivered using a 3-D conformal technique. The patient then received a boost to the seroma. This delivered an additional 7.5 Gy in 3 fractions using a 3 field photon  technique due to the depth of the seroma. The total dose was 50 Gy.  (3) consider genetics 03/21/2016 through the Breast/Ovarian cancer gene panel.offered by [facility] found no deleterious mutations in  ATM, BARD1, BRCA1, BRCA2, BRIP1, CDH1, CHEK2, EPCAM, FANCC, MLH1, MSH2, MSH6, NBN, PALB2, PMS2, PTEN, RAD51C, RAD51D, TP53, and XRCC2  (4) tamoxifen started December 2017, discontinued February 2021 at patient's discretion   (5) osteoporosis with a T score of -2.7 on bone density 05/04/2017: On  denosumab/Prolia   PLAN: Caoimhe is now 4 years out from definitive surgery for her breast cancer with no evidence of disease recurrence.  This is favorable.  Since her breast cancer was not invasive, the use of antiestrogens was more for prevention.  She certainly has lowered her risk of developing another breast cancer in the future and I am quite comfortable with her decision to discontinue tamoxifen.  I did reassure her that tamoxifen does not cause dementia.  We discussed COVID-19 issues and I commended her receiving the vaccine.  I think it would be helpful if her family also received it as they are very close and visit very frequently. The children of course are going to school in person and are being exposed to other children and indirectly the children's parents.  At this point I feel comfortable releasing Bobby from follow-up here.  All she will need in terms of breast cancer follow-up is her yearly mammography and a yearly physician breast exam.  I will be glad to see her again at any point in the future if and when the need arises but as of now are making no further routine appointments for her here.  Total encounter time 25 minutes.   Virgie Dad. Magrinat, MD  02/17/20 5:49 PM Medical Oncology and Hematology [facility] [place], [place] [address] Tel. [phone]    Fax. [phone]   I, Wilburn Mylar, am acting as scribe for Dr. Virgie Dad. Magrinat.  I, Lurline Del MD, have reviewed the above documentation for accuracy and completeness, and I agree with the above.   *Total Encounter Time as defined by the Centers for Medicare and Medicaid Services includes, in addition to the face-to-face time of a patient visit (documented in the note above) non-face-to-face time: obtaining and reviewing outside history, ordering and reviewing medications, tests or procedures, care coordination (communications with other health care professionals or  caregivers) and documentation in the medical record.

## 2020-02-17 ENCOUNTER — Other Ambulatory Visit: Payer: Self-pay

## 2020-02-17 ENCOUNTER — Inpatient Hospital Stay: Payer: Medicare Other | Attending: Oncology | Admitting: Oncology

## 2020-02-17 VITALS — BP 140/81 | HR 67 | Temp 97.1°F | Resp 18 | Ht 61.75 in | Wt 122.5 lb

## 2020-02-17 DIAGNOSIS — C50412 Malignant neoplasm of upper-outer quadrant of left female breast: Secondary | ICD-10-CM | POA: Diagnosis not present

## 2020-02-17 DIAGNOSIS — D0512 Intraductal carcinoma in situ of left breast: Secondary | ICD-10-CM | POA: Diagnosis present

## 2020-05-01 ENCOUNTER — Other Ambulatory Visit: Payer: Self-pay

## 2020-05-01 ENCOUNTER — Ambulatory Visit
Admission: RE | Admit: 2020-05-01 | Discharge: 2020-05-01 | Disposition: A | Discharge status: Home / Self Care | Payer: Medicare Other | Source: Ambulatory Visit | Attending: Family Medicine | Admitting: Family Medicine

## 2020-05-01 DIAGNOSIS — M810 Age-related osteoporosis without current pathological fracture: Secondary | ICD-10-CM

## 2020-11-12 ENCOUNTER — Ambulatory Visit: Payer: Medicare Other | Admitting: Obstetrics and Gynecology

## 2021-01-04 ENCOUNTER — Other Ambulatory Visit: Payer: Self-pay | Admitting: Family Medicine

## 2021-01-04 DIAGNOSIS — Z1231 Encounter for screening mammogram for malignant neoplasm of breast: Secondary | ICD-10-CM

## 2021-01-08 ENCOUNTER — Encounter: Payer: Self-pay | Admitting: Obstetrics and Gynecology

## 2021-01-08 ENCOUNTER — Ambulatory Visit (INDEPENDENT_AMBULATORY_CARE_PROVIDER_SITE_OTHER): Payer: Medicare Other | Admitting: Obstetrics and Gynecology

## 2021-01-08 ENCOUNTER — Other Ambulatory Visit: Payer: Self-pay

## 2021-01-08 VITALS — BP 134/82 | HR 66 | Ht 62.0 in | Wt 119.0 lb

## 2021-01-08 DIAGNOSIS — Z9189 Other specified personal risk factors, not elsewhere classified: Secondary | ICD-10-CM

## 2021-01-08 DIAGNOSIS — N814 Uterovaginal prolapse, unspecified: Secondary | ICD-10-CM

## 2021-01-08 DIAGNOSIS — A6000 Herpesviral infection of urogenital system, unspecified: Secondary | ICD-10-CM

## 2021-01-08 DIAGNOSIS — Z853 Personal history of malignant neoplasm of breast: Secondary | ICD-10-CM

## 2021-01-08 DIAGNOSIS — N8111 Cystocele, midline: Secondary | ICD-10-CM | POA: Diagnosis not present

## 2021-01-08 MED ORDER — VALACYCLOVIR HCL 500 MG PO TABS: ORAL_TABLET | ORAL | 4 refills | Status: DC

## 2021-01-08 NOTE — Progress Notes (Signed)
75 y.o. G3P2001 Divorced White or Caucasian Not Hispanic or Latino female here for breast and pelvic exam. No vaginal bleeding, not sexually active.   H/o DCIS in 9/17, s/p lumpectomy and radiation. Negative genetic testing. Stopped tamoxifen after 3 years.   H/O HSV, needs a refill on her valtrex. She is on it daily, still gets an outbreak ~q 3 months.    H/O mixed incontinence. Not currently leaking.   Last year she was fitted with a #3 ring pessary with support for a grade 2 cystocele and grade 1 uterine prolapse. At f/u the pessary was felt to be too small and she was having worsening incontinence so it was left out. About once a week she reduces the prolapse, which is tolerable.   Patient's last menstrual period was 05/16/2000 (approximate).          Sexually active: No.  The current method of family planning is status post menapausal Exercising: Yes.     Walking yard work.  Smoker:  no  Health Maintenance: Pap:  05/02/2018 WNL              05/29/14, Negative with neg HR HPV  History of abnormal Pap:  no MMG:  02/06/20 Diag density C Bi-rads 2 benign  BMD:   05/01/20 osteopenic T-score -2.3 Colonoscopy: 8/20, very tiny polyp, needs f/u in 5 years. Also had an endoscopy and was told she had gastritis.  TDaP:  02/08/11, will do with her primary.  Gardasil: n/a   reports that she has never smoked. She has never used smokeless tobacco. She reports that she does not drink alcohol and does not use drugs. Her Son lives in [place], 2 grandsons  (16 and 6).   Past Medical History:  Diagnosis Date   Anemia    Aortic atherosclerosis ([place]) 12/23/2016   Noted on CT   Arthritis    BRCA gene mutation negative 03/2016   Breast cancer ([place]) 2017   Left Breast Cancer   Breast cancer in female [facility]) 12/2015   left breast cancer, Estrogen negative, progesterone slight positive   Bronchiectasis ([facility])    Complication of anesthesia    Dry eye syndrome    Dyslipidemia    Dyspnea    Early cataract     Endometrial polyp    Family history of breast cancer    Family history of ovarian cancer    Family history of prostate cancer    Gall stones    GERD (gastroesophageal reflux disease)    Hematuria 03/09/2018   History of bronchitis    History of hiatal hernia    History of kidney stones 03/09/2018   HSV infection    pos I and II   Hypothyroidism    Internal and external hemorrhoids without complication 07/62/2633   noted on colonoscopy    Lymphedema    Left breast   Macular degeneration of both eyes    Osteopenia    Osteoporosis 1998   Off Fosomax 2008, Prolia 03/17/11, 09/14/11, 03/16/12   Persistent dry cough    Personal history of radiation therapy 2017   Left Breast Cancer   PMB (postmenopausal bleeding)    PONV (postoperative nausea and vomiting)    Pulmonary nodule 11/13/2015   10 to 11 mm subpleural pulmonary nodules in the right middle and right upper lobes    Shoulder impingement, right 11/22/2016   Vitamin D deficiency    Wears glasses     Past Surgical History:  Procedure Laterality Date  AUGMENTATION MAMMAPLASTY  1981   BREAST IMPLANT REMOVAL  1982   BREAST LUMPECTOMY Left 02/11/2016   BREAST LUMPECTOMY WITH RADIOACTIVE SEED AND SENTINEL LYMPH NODE BIOPSY Left 02/11/2016   Procedure: LEFT BREAST LUMPECTOMY WITH RADIOACTIVE SEED AND LEFT SENTINEL LYMPH NODE BIOPSY;  Surgeon: [name], MD;  Location: [facility];  Service: General;  Laterality: Left;   COLONOSCOPY  10/04   Dr Watt Climes   COLONOSCOPY  04/07/08   normal - Dr. Watt Climes   COLONOSCOPY  04/08/2013   DILATATION & CURETTAGE/HYSTEROSCOPY WITH MYOSURE N/A 03/19/2018   Procedure: DILATATION & CURETTAGE/HYSTEROSCOPY;  Surgeon: Salvadore Dom, MD;  Location[facility];  Service: Gynecology;  Laterality: N/A;   ELBOW SURGERY Right 12/1997   ESOPHAGOGASTRODUODENOSCOPY ENDOSCOPY  08/2012   [place]ak hernia with GERD   EXCISION OF BREAST BIOPSY Right 10/19/2017   Procedure: EXCISIONAL BIOPSY OF RIGHT  BREAST;  Surgeon: [name], MD;  Location: [facility];  Service: General;  Laterality: Right;   FOOT NEUROMA SURGERY Left 2004?   TONSILLECTOMY AND ADENOIDECTOMY     TUBAL LIGATION  1981    Current Outpatient Medications  Medication Sig Dispense Refill   B Complex Vitamins (B COMPLEX-B12 PO) Take by mouth daily.     Bioflavonoid Products (C COMPLEX PO) Take by mouth 1 day or 1 dose.     Calcium Carbonate (CALCIUM 600 PO) Take by mouth daily.     Carboxymethylcellulose Sod PF (RETAINE CMC) 0.5 % SOLN Apply 1 drop to eye 4 (four) times daily as needed.     Cholecalciferol (VITAMIN D3) 5000 units CAPS Take 1 capsule by mouth daily.     denosumab (PROLIA) 60 MG/ML SOLN injection Inject 60 mg into the skin every 6 (six) months. Administer in upper arm, thigh, or abdomen     famotidine (PEPCID AC) 10 MG tablet Take 10 mg by mouth 2 (two) times daily.     Ferrous Sulfate (IRON) 28 MG TABS Take 28 mg by mouth daily. Mon, Wed, and Fridays only     Multiple Vitamin (MULTI-VITAMIN PO) Take by mouth 2 (two) times daily as needed. Women's 50 +     Multiple Vitamins-Minerals (ICAPS AREDS 2) CAPS Take by mouth 2 (two) times daily.     RESTASIS 0.05 % ophthalmic emulsion Place 1 drop into both eyes 2 (two) times daily.      SYNTHROID 100 MCG tablet Take 1 tablet by mouth daily. Take 1 tablet daily except Wednesday. On Wednesday take 1/2 tablet.     valACYclovir (VALTREX) 500 MG tablet Take one tablet a day, can increase 1 tablet BID for 3 days if she has an outbreak. 90 tablet 4   No current facility-administered medications for this visit.    Family History  Problem Relation Age of Onset   Heart failure Mother    Prostate cancer Father 38   Liver cancer Father    Colon polyps Sister    Breast cancer Sister 27   Breast cancer Sister 19   Ovarian cancer Cousin        died at 35   Breast cancer Sister 18       recurrance at 33   Breast cancer Sister 10   Colon polyps Sister     Breast cancer Other        X 44 + age 9, 1 with recurrence, 60, 2   Lung cancer Brother        smoked   Breast cancer  Paternal Aunt    Prostate cancer Brother 5   Melanoma Brother    Colon cancer Other 37  The patient's father died at age 10. He had been diagnosed with prostate "and liver" cancer at the age of 74. The patient's mother died at age 39. The patient had 4 brothers, 5 sisters. One brother was diagnosed with lung cancer at age 54. She has 4 sisters with breast cancer, one initially diagnosed at age 58, recurring at age 57.  Review of Systems  All other systems reviewed and are negative.  Exam:   LMP 05/16/2000 (Approximate)   Weight change: '@WEIGHTCHANGE' @ Height:      Ht Readings from Last 3 Encounters:  02/17/20 5' 1.75" (1.568 m)  12/19/19 5' 1.75" (1.568 m)  11/12/19 5' 1.75" (1.568 m)    General appearance: alert, cooperative and appears stated age Head: Normocephalic, without obvious abnormality, atraumatic Neck: no adenopathy, supple, symmetrical, trachea midline and thyroid normal to inspection and palpation Lungs: clear to auscultation bilaterally Cardiovascular: regular rate and rhythm Breasts: normal appearance, no masses or tenderness Abdomen: soft, non-tender; non distended,  no masses,  no organomegaly Extremities: extremities normal, atraumatic, no cyanosis or edema Skin: Skin color, texture, turgor normal. No rashes or lesions Lymph nodes: Cervical, supraclavicular, and axillary nodes normal. No abnormal inguinal nodes palpated Neurologic: Grossly normal   Pelvic: External genitalia:  no lesions              Urethra:  normal appearing urethra with no masses, tenderness or lesions              Bartholins and Skenes: normal                 Vagina: normal appearing vagina with normal color and discharge, no lesions. Grade 2 cystocele and grade 1 uterine prolapse with valsalva              Cervix: no lesions               Bimanual Exam:  Uterus:   normal size, contour, position, consistency, mobility, non-tender              Adnexa: no mass, fullness, tenderness               Rectovaginal: Confirms               Anus:  normal sphincter tone, no lesions  Caryn Bee, CMA chaperoned for the exam.  1. GYN exam for high-risk Medicare patient Discussed breast self exam Discussed calcium and vit D intake Mammogram scheduled  2. History of breast cancer Treated in 2017, off of tamoxifen as of 2/21 (was on it for 3 years).   3. Herpes simplex infection of genitourinary system On suppression, still with some outbreaks - valACYclovir (VALTREX) 500 MG tablet; Take one tablet a day, can increase 1 tablet BID for 3 days if she has an outbreak.  Dispense: 90 tablet; Refill: 4  4. Cystocele, midline Stable, tolerable  5. Uterine prolapse Stable

## 2021-01-08 NOTE — Patient Instructions (Signed)

## 2021-02-17 ENCOUNTER — Other Ambulatory Visit: Payer: Self-pay

## 2021-02-17 ENCOUNTER — Ambulatory Visit
Admission: RE | Admit: 2021-02-17 | Discharge: 2021-02-17 | Disposition: A | Discharge status: Home / Self Care | Payer: Medicare Other | Source: Ambulatory Visit | Attending: Family Medicine | Admitting: Family Medicine

## 2021-02-17 DIAGNOSIS — Z1231 Encounter for screening mammogram for malignant neoplasm of breast: Secondary | ICD-10-CM

## 2021-03-08 NOTE — Progress Notes (Signed)
Referring-Paula White MD Reason for referral-hyperlipidemia and aortic atherosclerosis  HPI: 75 year old female for evaluation of hyperlipidemia and aortic atherosclerosis at request of Harlan Stains MD.  Also with history of bronchiectasis and breast cancer.  Laboratories September 2022 showed total cholesterol 183 with LDL 120.  Liver functions normal.  Chest CT August 2018 showed aortic atherosclerosis and coronary artery calcification.  Patient denies dyspnea on exertion, orthopnea, PND, pedal edema, exertional chest pain or syncope.  Cardiology now asked to evaluate.  Current Outpatient Medications  Medication Sig Dispense Refill   atorvastatin (LIPITOR) 10 MG tablet Take 10 mg by mouth daily.     B Complex Vitamins (B COMPLEX-B12 PO) Take by mouth daily.     Bioflavonoid Products (C COMPLEX PO) Take by mouth 1 day or 1 dose.     Cholecalciferol (VITAMIN D3) 5000 units CAPS Take 1 capsule by mouth daily.     denosumab (PROLIA) 60 MG/ML SOLN injection Inject 60 mg into the skin every 6 (six) months. Administer in upper arm, thigh, or abdomen     famotidine (PEPCID) 10 MG tablet Take 10 mg by mouth 2 (two) times daily.     Ferrous Sulfate (IRON) 28 MG TABS Take 28 mg by mouth daily. Mon, Wed, and Fridays only     Multiple Vitamin (MULTI-VITAMIN PO) Take by mouth 2 (two) times daily as needed. Women's 50 +     Multiple Vitamins-Minerals (ICAPS AREDS 2) CAPS Take by mouth 2 (two) times daily.     RESTASIS 0.05 % ophthalmic emulsion Place 1 drop into both eyes 2 (two) times daily.      SYNTHROID 75 MCG tablet Take 75 mcg by mouth daily.     valACYclovir (VALTREX) 500 MG tablet Take one tablet a day, can increase 1 tablet BID for 3 days if she has an outbreak. 90 tablet 4   No current facility-administered medications for this visit.    No Active Allergies   Past Medical History:  Diagnosis Date   Anemia    Aortic atherosclerosis ([place]) 12/23/2016   Noted on CT   Arthritis     BRCA gene mutation negative 03/2016   Breast cancer ([place]) 2017   Left Breast Cancer   Breast cancer in female [facility]) 12/2015   left breast cancer, Estrogen negative, progesterone slight positive   Bronchiectasis (HCC)    Complication of anesthesia    Dry eye syndrome    Dyslipidemia    Dyspnea    Early cataract    Endometrial polyp    Family history of breast cancer    Family history of ovarian cancer    Family history of prostate cancer    Gall stones    GERD (gastroesophageal reflux disease)    Hematuria 03/09/2018   History of bronchitis    History of hiatal hernia    History of kidney stones 03/09/2018   HSV infection    pos I and II   Hypothyroidism    Internal and external hemorrhoids without complication 32/20/2542   noted on colonoscopy    Lymphedema    Left breast   Macular degeneration of both eyes    Osteopenia    Osteoporosis 1998   Off Fosomax 2008, Prolia 03/17/11, 09/14/11, 03/16/12   Persistent dry cough    Personal history of radiation therapy 2017   Left Breast Cancer   PMB (postmenopausal bleeding)    PONV (postoperative nausea and vomiting)    Pulmonary nodule 11/13/2015   10 to  11 mm subpleural pulmonary nodules in the right middle and right upper lobes    Shoulder impingement, right 11/22/2016   Vitamin D deficiency    Wears glasses     Past Surgical History:  Procedure Laterality Date   AUGMENTATION MAMMAPLASTY  1981   BREAST IMPLANT REMOVAL  1982   BREAST LUMPECTOMY Left 02/11/2016   BREAST LUMPECTOMY WITH RADIOACTIVE SEED AND SENTINEL LYMPH NODE BIOPSY Left 02/11/2016   Procedure: LEFT BREAST LUMPECTOMY WITH RADIOACTIVE SEED AND LEFT SENTINEL LYMPH NODE BIOPSY;  Surgeon: [name], MD;  Location: [place];  Service: General;  Laterality: Left;   COLONOSCOPY  10/04   Dr Watt Climes   COLONOSCOPY  04/07/08   normal - Dr. Watt Climes   COLONOSCOPY  04/08/2013   DILATATION & CURETTAGE/HYSTEROSCOPY WITH MYOSURE N/A 03/19/2018   Procedure: DILATATION &  CURETTAGE/HYSTEROSCOPY;  Surgeon: Salvadore Dom, MD;  Location: [place];  Service: Gynecology;  Laterality: N/A;   ELBOW SURGERY Right 12/1997   ESOPHAGOGASTRODUODENOSCOPY ENDOSCOPY  08/2012   [place] hernia with GERD   EXCISION OF BREAST BIOPSY Right 10/19/2017   Procedure: EXCISIONAL BIOPSY OF RIGHT BREAST;  Surgeon: [name], MD;  Location: [place];  Service: General;  Laterality: Right;   FOOT NEUROMA SURGERY Left 2004?   TONSILLECTOMY AND ADENOIDECTOMY     TUBAL LIGATION  1981    Social History   Socioeconomic History   Marital status: Divorced    Spouse name: Not on file   Number of children: 1   Years of education: Not on file   Highest education level: Not on file  Occupational History   Not on file  Tobacco Use   Smoking status: Never   Smokeless tobacco: Never  Vaping Use   Vaping Use: Never used  Substance and Sexual Activity   Alcohol use: No   Drug use: No   Sexual activity: Not Currently    Partners: Male    Birth control/protection: Post-menopausal  Other Topics Concern   Not on file  Social History Narrative   Not on file   Social Determinants of Health   Financial Resource Strain: Not on file  Food Insecurity: Not on file  Transportation Needs: Not on file  Physical Activity: Not on file  Stress: Not on file  Social Connections: Not on file  Intimate Partner Violence: Not on file    Family History  Problem Relation Age of Onset   Heart failure Mother    Prostate cancer Father 34   Liver cancer Father    Colon polyps Sister    Breast cancer Sister 73   Breast cancer Sister 79   Ovarian cancer Cousin        died at 29   Breast cancer Sister 59       recurrance at 78   Breast cancer Sister 42   Colon polyps Sister    Breast cancer Other        X 4 + age 65, 25 with recurrence, 47, 61   Lung cancer Brother        smoked   Breast cancer Paternal Aunt    Prostate cancer Brother 22   Melanoma  Brother    Colon cancer Other 50    ROS: Recent rash but no fevers or chills, productive cough, hemoptysis, dysphasia, odynophagia, melena, hematochezia, dysuria, hematuria, rash, seizure activity, orthopnea, PND, pedal edema, claudication. Remaining systems are negative.  Physical Exam:   Blood pressure 132/86, pulse 81, height  '5\' 2"'  (1.575 m), weight 127 lb (57.6 kg), last menstrual period 05/16/2000, SpO2 95 %.  General:  Well developed/well nourished in NAD Skin warm/dry; diffuse maculopapular rash Patient not depressed No peripheral clubbing Back-normal HEENT-normal/normal eyelids Neck supple/normal carotid upstroke bilaterally; no bruits; no JVD; no thyromegaly chest - CTA/ normal expansion CV - RRR/normal S1 and S2; no murmurs, rubs or gallops;  PMI nondisplaced Abdomen -NT/ND, no HSM, no mass, + bowel sounds, no bruit 2+ femoral pulses, no bruits Ext-no edema, chords, 2+ DP Neuro-grossly nonfocal  ECG -normal sinus rhythm at a rate of 81, right axis deviation.  Personally reviewed  A/P  1 aortic atherosclerosis/coronary calcification-she is not having chest pain.  We will arrange a calcium score for risk stratification.  Continue statin.  2 hyperlipidemia-increase Lipitor to 40 mg daily.  In 8 weeks we will check lipids and liver.  We also discussed lifestyle modification.  Note she does not smoke.  3 rash-she has been evaluated by urgent care and we will follow-up with him for further management.  Kirk Ruths, MD

## 2021-03-15 ENCOUNTER — Other Ambulatory Visit: Payer: Self-pay

## 2021-03-15 ENCOUNTER — Ambulatory Visit (INDEPENDENT_AMBULATORY_CARE_PROVIDER_SITE_OTHER): Payer: Medicare Other | Admitting: Cardiology

## 2021-03-15 ENCOUNTER — Encounter: Payer: Self-pay | Admitting: Cardiology

## 2021-03-15 VITALS — BP 132/86 | HR 81 | Ht 62.0 in | Wt 127.0 lb

## 2021-03-15 DIAGNOSIS — E785 Hyperlipidemia, unspecified: Secondary | ICD-10-CM | POA: Diagnosis not present

## 2021-03-15 DIAGNOSIS — I700 Atherosclerosis of aorta: Secondary | ICD-10-CM

## 2021-03-15 MED ORDER — ATORVASTATIN CALCIUM 40 MG PO TABS: 40.0000 mg | ORAL_TABLET | Freq: Every day | ORAL | 3 refills | Status: DC

## 2021-03-15 NOTE — Patient Instructions (Signed)
Medication Instructions:   INCREASE ATORVASTATIN TO 40 MG ONCE DAILY= 4 OF THE 10 MG TABLETS ONCE DAILY  *If you need a refill on your cardiac medications before your next appointment, please call your pharmacy*   Lab Work:  Your physician recommends that you return for lab work in: 8 WEEKS= FASTING  If you have labs (blood work) drawn today and your tests are completely normal, you will receive your results only by: [place] (if you have MyChart) OR A paper copy in the mail If you have any lab test that is abnormal or we need to change your treatment, we will call you to review the results.   Testing/Procedures:  CORONARY CALCIUM CT SCORE AT THE [facility] OFFICE   Follow-Up: At [facility], you and your health needs are our priority.  As part of our continuing mission to provide you with exceptional heart care, we have created designated Provider Care Teams.  These Care Teams include your primary Cardiologist (physician) and Advanced Practice Providers (APPs -  Physician Assistants and Nurse Practitioners) who all work together to provide you with the care you need, when you need it.  We recommend signing up for the patient portal called "MyChart".  Sign up information is provided on this After Visit Summary.  MyChart is used to connect with patients for Virtual Visits (Telemedicine).  Patients are able to view lab/test results, encounter notes, upcoming appointments, etc.  Non-urgent messages can be sent to your provider as well.   To learn more about what you can do with MyChart, go to [email or web address].    Your next appointment:   12 month(s)  The format for your next appointment:   In Person  Provider:   Kirk Ruths, MD

## 2021-03-16 LAB — LIPID PANEL
Chol/HDL Ratio: 2.8 ratio (ref 0.0–4.4)
Cholesterol, Total: 133 mg/dL (ref 100–199)
HDL: 47 mg/dL (ref >39)
LDL Chol Calc (NIH): 68 mg/dL (ref 0–99)
Triglycerides: 94 mg/dL (ref 0–149)
VLDL Cholesterol Cal: 18 mg/dL (ref 5–40)

## 2021-03-16 LAB — HEPATIC FUNCTION PANEL
ALT: 26 IU/L (ref 0–32)
AST: 22 IU/L (ref 0–40)
Albumin: 4.3 g/dL (ref 3.7–4.7)
Alkaline Phosphatase: 100 IU/L (ref 44–121)
Bilirubin Total: 0.5 mg/dL (ref 0.0–1.2)
Bilirubin, Direct: 0.15 mg/dL (ref 0.00–0.40)
Total Protein: 6.3 g/dL (ref 6.0–8.5)

## 2021-03-17 ENCOUNTER — Other Ambulatory Visit: Payer: Self-pay | Admitting: *Deleted

## 2021-03-17 DIAGNOSIS — E785 Hyperlipidemia, unspecified: Secondary | ICD-10-CM

## 2021-03-18 ENCOUNTER — Telehealth: Payer: Self-pay | Admitting: Cardiology

## 2021-03-18 DIAGNOSIS — R0602 Shortness of breath: Secondary | ICD-10-CM

## 2021-03-18 NOTE — Telephone Encounter (Signed)
Pt c/o Shortness Of Breath: STAT if SOB developed within the last 24 hours or pt is noticeably SOB on the phone  1. Are you currently SOB (can you hear that pt is SOB on the phone)? no  2. How long have you been experiencing SOB? A while  3. Are you SOB when sitting or when up moving around? With activity , mostly bringing the trash cans from around her house   4. Are you currently experiencing any other symptoms? No   Patient told Dr. Stanford Breed at her appt the other day that she wasn't having any SOB. She forgot to mention it at her appt and wanted to correct her statement

## 2021-03-18 NOTE — Telephone Encounter (Signed)
Spoke with pt, Aware of dr crenshaw's recommendations.  Order placed 

## 2021-03-18 NOTE — Telephone Encounter (Signed)
Spoke with pt, when she was seen recently she reported that she did not have SOB but she called today to report she does have SOB when walking her trash can from the back of her house out to the road and back. She also reports SOB if she rushes to get somewhere or picks up something that is heavy. She denies SOB with routine activities and denies swelling or orthopnea. She is scheduled for a calcium score next week. Will forward for dr Stanford Breed review

## 2021-03-18 NOTE — Telephone Encounter (Signed)
Left message for pt to call.

## 2021-03-24 ENCOUNTER — Other Ambulatory Visit: Payer: Self-pay

## 2021-03-24 ENCOUNTER — Ambulatory Visit (HOSPITAL_BASED_OUTPATIENT_CLINIC_OR_DEPARTMENT_OTHER)
Admission: RE | Admit: 2021-03-24 | Discharge: 2021-03-24 | Disposition: A | Discharge status: Home / Self Care | Payer: Self-pay | Source: Ambulatory Visit | Attending: Cardiology | Admitting: Cardiology

## 2021-03-24 DIAGNOSIS — I700 Atherosclerosis of aorta: Secondary | ICD-10-CM | POA: Insufficient documentation

## 2021-03-25 ENCOUNTER — Telehealth: Payer: Self-pay | Admitting: Cardiology

## 2021-03-25 NOTE — Telephone Encounter (Signed)
Mildly elevated Ca score; continue lipitor; fu with primary care or pulmonary for abnormal findings lungs; pt with known bronchiectasis Kirk Ruths  RN reviewed   the above information with patient . Patient aware to follow up with primary. Patient states she had seen Dr wert in 2017. RN informed patient to call primary for next  step. Routed result to Dr Dema Severin.

## 2021-03-25 NOTE — Telephone Encounter (Signed)
Follow Up:    Patient saw her results on My Chart for her CT, she did not understand it. She would like for somebody to please go over the  results with her please.

## 2021-04-12 ENCOUNTER — Ambulatory Visit (INDEPENDENT_AMBULATORY_CARE_PROVIDER_SITE_OTHER): Payer: Medicare Other | Admitting: Obstetrics and Gynecology

## 2021-04-12 ENCOUNTER — Encounter: Payer: Self-pay | Admitting: Obstetrics and Gynecology

## 2021-04-12 ENCOUNTER — Other Ambulatory Visit: Payer: Self-pay

## 2021-04-12 ENCOUNTER — Ambulatory Visit (HOSPITAL_COMMUNITY): Payer: Medicare Other | Attending: Cardiology

## 2021-04-12 VITALS — BP 120/72 | HR 88 | Temp 98.4°F | Wt 119.0 lb

## 2021-04-12 DIAGNOSIS — R0602 Shortness of breath: Secondary | ICD-10-CM | POA: Diagnosis not present

## 2021-04-12 DIAGNOSIS — N3090 Cystitis, unspecified without hematuria: Secondary | ICD-10-CM | POA: Diagnosis not present

## 2021-04-12 LAB — ECHOCARDIOGRAM COMPLETE
Area-P 1/2: 4.26 cm2
S' Lateral: 2.6 cm
Weight: 1904 oz

## 2021-04-12 MED ORDER — SULFAMETHOXAZOLE-TRIMETHOPRIM 800-160 MG PO TABS: 1.0000 | ORAL_TABLET | Freq: Two times a day (BID) | ORAL | 0 refills | Status: DC

## 2021-04-12 MED ORDER — PHENAZOPYRIDINE HCL 200 MG PO TABS: 200.0000 mg | ORAL_TABLET | Freq: Three times a day (TID) | ORAL | 0 refills | Status: DC

## 2021-04-12 NOTE — Progress Notes (Signed)
GYNECOLOGY  VISIT   HPI: 75 y.o.   Divorced White or Caucasian Not Hispanic or Latino  female   [phone] with Patient's last menstrual period was 05/16/2000 (approximate).   here for a 4 day h/o urinary urgency , frequency and dysuria. She took Azo for 2 days which helped for the 48 hours she was on it.  No fever, no flank pain.   She recently had an increase in her statin dose, has developed a rash. She has seen Dermatology.  GYNECOLOGIC HISTORY: Patient's last menstrual period was 05/16/2000 (approximate). 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   Genetic testing 03/22/2016   Family history of breast cancer    Family history of ovarian cancer    Family history of prostate cancer    Breast cancer of upper-outer quadrant of left female breast ([place]) 01/20/2016   Bronchiectasis without acute exacerbation ([place])  with MPNs 12/11/2015    Past Medical History:  Diagnosis Date   Anemia    Aortic atherosclerosis ([place]) 12/23/2016   Noted on CT   Arthritis    BRCA gene mutation negative 03/2016   Breast cancer ([place]) 2017   Left Breast Cancer   Breast cancer in female [facility]) 12/2015   left breast cancer, Estrogen negative, progesterone slight positive   Bronchiectasis (HCC)    Complication of anesthesia    Dry eye syndrome    Dyslipidemia    Dyspnea    Early cataract    Endometrial polyp    Family history of breast cancer    Family history of ovarian cancer    Family history of prostate cancer    Gall stones    GERD (gastroesophageal reflux disease)     Hematuria 03/09/2018   History of bronchitis    History of hiatal hernia    History of kidney stones 03/09/2018   HSV infection    pos I and II   Hypothyroidism    Internal and external hemorrhoids without complication 16/11/3708   noted on colonoscopy    Lymphedema    Left breast   Macular degeneration of both eyes    Osteopenia    Osteoporosis 1998   Off Fosomax 2008, Prolia 03/17/11, 09/14/11, 03/16/12   Persistent dry cough    Personal history of radiation therapy 2017   Left Breast Cancer   PMB (postmenopausal bleeding)    PONV (postoperative nausea and vomiting)    Pulmonary nodule 11/13/2015   10 to 11 mm subpleural pulmonary nodules in the right middle and right upper lobes    Shoulder impingement, right 11/22/2016   Vitamin D deficiency    Wears glasses     Past Surgical History:  Procedure Laterality Date   Greasy   BREAST LUMPECTOMY Left  02/11/2016   BREAST LUMPECTOMY WITH RADIOACTIVE SEED AND SENTINEL LYMPH NODE BIOPSY Left 02/11/2016   Procedure: LEFT BREAST LUMPECTOMY WITH RADIOACTIVE SEED AND LEFT SENTINEL LYMPH NODE BIOPSY;  Surgeon: [name], MD;  Location: [place];  Service: General;  Laterality: Left;   COLONOSCOPY  10/04   Dr Watt Climes   COLONOSCOPY  04/07/08   normal - Dr. Watt Climes   COLONOSCOPY  04/08/2013   DILATATION & CURETTAGE/HYSTEROSCOPY WITH MYOSURE N/A 03/19/2018   Procedure: DILATATION & CURETTAGE/HYSTEROSCOPY;  Surgeon: Salvadore Dom, MD;  [facility];  Service: Gynecology;  Laterality: N/A;   ELBOW SURGERY Right 12/1997   ESOPHAGOGASTRODUODENOSCOPY ENDOSCOPY  08/2012   [place]ak hernia with GERD   EXCISION OF BREAST BIOPSY Right 10/19/2017   Procedure: EXCISIONAL BIOPSY OF RIGHT BREAST;  Surgeon: [name], MD;  Location: [place];  Service: General;  Laterality: Right;   FOOT NEUROMA SURGERY Left 2004?   TONSILLECTOMY AND ADENOIDECTOMY     TUBAL  LIGATION  1981    Current Outpatient Medications  Medication Sig Dispense Refill   atorvastatin (LIPITOR) 40 MG tablet Take 1 tablet (40 mg total) by mouth daily. 90 tablet 3   B Complex Vitamins (B COMPLEX-B12 PO) Take by mouth daily.     Bioflavonoid Products (C COMPLEX PO) Take by mouth 1 day or 1 dose.     Cholecalciferol (VITAMIN D3) 5000 units CAPS Take 1 capsule by mouth daily.     denosumab (PROLIA) 60 MG/ML SOLN injection Inject 60 mg into the skin every 6 (six) months. Administer in upper arm, thigh, or abdomen     famotidine (PEPCID) 10 MG tablet Take 10 mg by mouth 2 (two) times daily.     Ferrous Sulfate (IRON) 28 MG TABS Take 28 mg by mouth daily. Mon, Wed, and Fridays only     Multiple Vitamin (MULTI-VITAMIN PO) Take by mouth 2 (two) times daily as needed. Women's 50 +     Multiple Vitamins-Minerals (ICAPS AREDS 2) CAPS Take by mouth 2 (two) times daily.     RESTASIS 0.05 % ophthalmic emulsion Place 1 drop into both eyes 2 (two) times daily.      SYNTHROID 75 MCG tablet Take 75 mcg by mouth daily.     valACYclovir (VALTREX) 500 MG tablet Take one tablet a day, can increase 1 tablet BID for 3 days if she has an outbreak. 90 tablet 4   No current facility-administered medications for this visit.     ALLERGIES: Patient has no active allergies.  Family History  Problem Relation Age of Onset   Heart failure Mother    Prostate cancer Father 4   Liver cancer Father    Colon polyps Sister    Breast cancer Sister 29   Breast cancer Sister 61   Ovarian cancer Cousin        died at 46   Breast cancer Sister 1       recurrance at 46   Breast cancer Sister 93   Colon polyps Sister    Breast cancer Other        X 106 + age 77, 52 with recurrence, 41, 58   Lung cancer Brother        smoked   Breast cancer Paternal Aunt    Prostate cancer Brother 8   Melanoma Brother    Colon cancer Other 28    Social History   Socioeconomic History   Marital status: Divorced  Spouse name: Not on file   Number of children: 1   Years of education: Not on file   Highest education level: Not on file  Occupational History   Not on file  Tobacco Use   Smoking status: Never   Smokeless tobacco: Never  Vaping Use   Vaping Use: Never used  Substance and Sexual Activity   Alcohol use: No   Drug use: No   Sexual activity: Not Currently    Partners: Male    Birth control/protection: Post-menopausal  Other Topics Concern   Not on file  Social History Narrative   Not on file   Social Determinants of Health   Financial Resource Strain: Not on file  Food Insecurity: Not on file  Transportation Needs: Not on file  Physical Activity: Not on file  Stress: Not on file  Social Connections: Not on file  Intimate Partner Violence: Not on file    Review of Systems  Genitourinary:  Positive for dysuria, frequency and urgency.   PHYSICAL EXAMINATION:    BP 120/72   Pulse 88   Temp 98.4 F (36.9 C)   Wt 119 lb (54 kg)   LMP 05/16/2000 (Approximate)   SpO2 98%   BMI 21.77 kg/m     General appearance: alert, cooperative and appears stated age Abdomen: soft,mildly tender in the SP region, non distended, no masses,  no organomegaly CVA: not tender  1. Cystitis She reports normal renal function.  - Urinalysis,Complete w/RFL Culture - sulfamethoxazole-trimethoprim (BACTRIM DS) 800-160 MG tablet; Take 1 tablet by mouth 2 (two) times daily. One PO BID x 3 days  Dispense: 6 tablet; Refill: 0 - phenazopyridine (PYRIDIUM) 200 MG tablet; Take 1 tablet (200 mg total) by mouth 3 (three) times daily with meals.  Dispense: 6 tablet; Refill: 0 -Call if not feeling better in 48 hours, or if she develops fever or flank pain

## 2021-04-12 NOTE — Patient Instructions (Signed)
Urinary Tract Infection, Adult A urinary tract infection (UTI) is an infection of any part of the urinary tract. The urinary tract includes the kidneys, ureters, bladder, and urethra. These organs make, store, and get rid of urine in the body. An upper UTI affects the ureters and kidneys. A lower UTI affects the bladder and urethra. What are the causes? Most urinary tract infections are caused by bacteria in your genital area around your urethra, where urine leaves your body. These bacteria grow and cause inflammation of your urinary tract. What increases the risk? You are more likely to develop this condition if: You have a urinary catheter that stays in place. You are not able to control when you urinate or have a bowel movement (incontinence). You are female and you: Use a spermicide or diaphragm for birth control. Have low estrogen levels. Are pregnant. You have certain genes that increase your risk. You are sexually active. You take antibiotic medicines. You have a condition that causes your flow of urine to slow down, such as: An enlarged prostate, if you are female. Blockage in your urethra. A kidney stone. A nerve condition that affects your bladder control (neurogenic bladder). Not getting enough to drink, or not urinating often. You have certain medical conditions, such as: Diabetes. A weak disease-fighting system (immunesystem). Sickle cell disease. Gout. Spinal cord injury. What are the signs or symptoms? Symptoms of this condition include: Needing to urinate right away (urgency). Frequent urination. This may include small amounts of urine each time you urinate. Pain or burning with urination. Blood in the urine. Urine that smells bad or unusual. Trouble urinating. Cloudy urine. Vaginal discharge, if you are female. Pain in the abdomen or the lower back. You may also have: Vomiting or a decreased appetite. Confusion. Irritability or tiredness. A fever or  chills. Diarrhea. The first symptom in older adults may be confusion. In some cases, they may not have any symptoms until the infection has worsened. How is this diagnosed? This condition is diagnosed based on your medical history and a physical exam. You may also have other tests, including: Urine tests. Blood tests. Tests for STIs (sexually transmitted infections). If you have had more than one UTI, a cystoscopy or imaging studies may be done to determine the cause of the infections. How is this treated? Treatment for this condition includes: Antibiotic medicine. Over-the-counter medicines to treat discomfort. Drinking enough water to stay hydrated. If you have frequent infections or have other conditions such as a kidney stone, you may need to see a health care provider who specializes in the urinary tract (urologist). In rare cases, urinary tract infections can cause sepsis. Sepsis is a life-threatening condition that occurs when the body responds to an infection. Sepsis is treated in the hospital with IV antibiotics, fluids, and other medicines. Follow these instructions at home: Medicines Take over-the-counter and prescription medicines only as told by your health care provider. If you were prescribed an antibiotic medicine, take it as told by your health care provider. Do not stop using the antibiotic even if you start to feel better. General instructions Make sure you: Empty your bladder often and completely. Do not hold urine for long periods of time. Empty your bladder after sex. Wipe from front to back after urinating or having a bowel movement if you are female. Use each tissue only one time when you wipe. Drink enough fluid to keep your urine pale yellow. Keep all follow-up visits. This is important. Contact a health care provider   if: Your symptoms do not get better after 1-2 days. Your symptoms go away and then return. Get help right away if: You have severe pain in your  back or your lower abdomen. You have a fever or chills. You have nausea or vomiting. Summary A urinary tract infection (UTI) is an infection of any part of the urinary tract, which includes the kidneys, ureters, bladder, and urethra. Most urinary tract infections are caused by bacteria in your genital area. Treatment for this condition often includes antibiotic medicines. If you were prescribed an antibiotic medicine, take it as told by your health care provider. Do not stop using the antibiotic even if you start to feel better. Keep all follow-up visits. This is important. This information is not intended to replace advice given to you by your health care provider. Make sure you discuss any questions you have with your health care provider. Document Revised: 12/13/2019 Document Reviewed: 12/13/2019 Elsevier Patient Education  2022 [employer]  

## 2021-04-15 LAB — URINALYSIS, COMPLETE W/RFL CULTURE
Bilirubin Urine: NEGATIVE
Glucose, UA: NEGATIVE
Hyaline Cast: NONE SEEN /LPF
Ketones, ur: NEGATIVE
Nitrites, Initial: NEGATIVE
Specific Gravity, Urine: 1.01 (ref 1.001–1.035)
WBC, UA: >=60 /HPF — AB (ref 0–5)
pH: 7 (ref 5.0–8.0)

## 2021-04-15 LAB — URINE CULTURE
MICRO NUMBER:: 12683351
SPECIMEN QUALITY:: ADEQUATE

## 2021-04-15 LAB — CULTURE INDICATED

## 2021-05-20 ENCOUNTER — Institutional Professional Consult (permissible substitution): Payer: Medicare Other | Admitting: Internal Medicine

## 2021-06-06 NOTE — Progress Notes (Signed)
Subjective:    Patient ID: Paula Dawson, female    DOB: Aug 14, 1945,    MRN: [id number]    Brief patient profile:  29   yowf  Never smoker with unexplained wt loss/ lower abd pain > ct Abd 05/12/16  > CT chest 11/13/15 c/w bronchiectasis so referred to pulmonary clinic 12/11/2015 by Dr Harlan Stains    History of Present Illness  12/11/2015 1st [facility] office visit/ Hisako Bugh   Chief Complaint  Patient presents with   Pulmonary Consult    Referred by Dr. Caren Griffins white for eval of pulmonary nodules. Pt c/o non prod cough for 3 days.   pt was told sickly as child #10/10 children and remembers a lot of infections as child and occ visits to the doctor but never admitted and by hs better but only "avg athlete" in terms of aerobic activities with no pna as adult that she can recall and minimal tendency to cough or am excess/ purulent mucus and no h/o hemoptysis She says the abd pain/ wt loss the resulted in the ct abd /dx of bronchiectasis have resolved though no dx rendered. 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  denies other breathing complaints currently.    Rec Please see patient coordinator before you leave today  to schedule CT without contrast in early September and I will call you with results For colds >  advil cold and sinus For drippy nose/allergies > zyrtec  (can take with advil cold and sinus if you need to)  No regular follow up needed here needed since you have such a good PCP    06/07/2021  RE-establish  ov/Blanca Thornton re: bronchiectasis maint on nothing   Chief Complaint  Patient presents with    Pulmonary Consult    Referred by Dr. Harlan Stains for eval of abnormal lung findings on cardiac scrore CT done 03/24/21.  She does c/o cough, worse at night when she lies down. She occ will cough up some clear sputum.   Dyspnea:  some doe hauling trash uphill Cough: after stirs and hs / min production mucoid  Sleeping: bed is flat/ one pillow  SABA use: none  02: none  Covid status:   vax x 2 / never had infection Overt HB rx with pepcid up to bid ac / no on ppi as of  yet   No obvious day to day or daytime variability or assoc purulent sputum or mucus plugs or hemoptysis or cp or chest tightness, subjective wheeze or overt sinus  symptoms.      Also denies any obvious fluctuation of symptoms with weather or environmental changes or other aggravating or alleviating factors except as outlined above   No unusual exposure hx or h/o childhood pna/ asthma or knowledge of premature birth.  Current Allergies, Complete Past Medical History, Past Surgical History, Family History, and Social History were reviewed in [employer] record.  ROS  The following are not active complaints unless bolded Hoarseness, sore throat, dysphagia, dental problems, itching, sneezing,  nasal congestion or discharge of excess mucus or purulent  secretions, ear ache,   fever, chills, sweats, unintended wt loss or wt gain, classically pleuritic or exertional cp,  orthopnea pnd or arm/hand swelling  or leg swelling, presyncope, palpitations, abdominal pain, anorexia, nausea, vomiting, diarrhea  or change in bowel habits or change in bladder habits, change in stools or change in urine, dysuria, hematuria,  rash, arthralgias, visual complaints, headache, numbness, weakness or ataxia or problems with walking or coordination,  change in mood or  memory.        Current Meds  Medication Sig   atorvastatin (LIPITOR) 40 MG tablet Take 1 tablet (40 mg total) by mouth daily.   B Complex Vitamins (B COMPLEX-B12  PO) Take by mouth daily.   Bioflavonoid Products (C COMPLEX PO) Take by mouth 1 day or 1 dose.   Cholecalciferol (VITAMIN D3) 5000 units CAPS Take 1 capsule by mouth daily.   denosumab (PROLIA) 60 MG/ML SOLN injection Inject 60 mg into the skin every 6 (six) months. Administer in upper arm, thigh, or abdomen   famotidine (PEPCID) 20 MG tablet Take 20 mg by mouth 2 (two) times daily.   Ferrous Sulfate (IRON) 28 MG TABS Take 28 mg by mouth daily. Mon, Wed, and Fridays only   Multiple Vitamin (MULTI-VITAMIN PO) Take by mouth 2 (two) times daily as needed. Women's 50 +   Multiple Vitamins-Minerals (ICAPS AREDS 2) CAPS Take by mouth 2 (two) times daily.   Propylene Glycol (SYSTANE BALANCE OP) Apply 1 tablet to eye daily.   RESTASIS 0.05 % ophthalmic emulsion Place 1 drop into both eyes 2 (two) times daily.    SYNTHROID 75 MCG tablet Take 75 mcg by mouth daily.   valACYclovir (VALTREX) 500 MG tablet Take one tablet a day, can increase 1 tablet BID for 3 days if she has an outbreak.                      Objective:   Physical Exam   wts  06/07/2021       118   09/16/2016        119   12/11/15 113 lb 3.2 oz (51.3 kg)  08/19/15 117 lb (53.1 kg)  04/21/15 116 lb (52.6 kg)     Vital signs reviewed  06/07/2021  - Note at rest 02 sats  97% on RA   General appearance:    pleasant amb wf nad   HEENT : pt wearing mask not removed for exam due to covid -19 concerns.    NECK :  without JVD/Nodes/TM/ nl carotid upstrokes bilaterally   LUNGS: no acc muscle use,  Nl contour chest which is clear to A and P bilaterally without cough on insp or exp maneuvers   CV:  RRR  no s3 or murmur or increase in P2, and no edema   ABD:  soft and nontender with nl inspiratory excursion in the supine position. No bruits or organomegaly appreciated, bowel sounds nl  MS:  Nl gait/ ext warm without deformities, calf tenderness, cyanosis or clubbing No obvious joint restrictions   SKIN: warm and dry without  lesions    NEURO:  alert, approp, nl sensorium with  no motor or cerebellar deficits apparent.    CXR PA and Lateral:   06/07/2021 :    I personally reviewed images and agree with radiology impression as follows:     Right middle lobe/perihilar increased density, new since the prior radiograph, likely representing progression of bronchiectasis and architectural distortion/scarring and nodularity.  My comment:  these are the same changes seen on CT coronary 03/24/21 so do not need CT f/u          Assessment & Plan:

## 2021-06-07 ENCOUNTER — Ambulatory Visit (INDEPENDENT_AMBULATORY_CARE_PROVIDER_SITE_OTHER): Payer: Medicare Other | Admitting: Internal Medicine

## 2021-06-07 ENCOUNTER — Ambulatory Visit (INDEPENDENT_AMBULATORY_CARE_PROVIDER_SITE_OTHER): Payer: Medicare Other

## 2021-06-07 ENCOUNTER — Encounter: Payer: Self-pay | Admitting: Internal Medicine

## 2021-06-07 ENCOUNTER — Other Ambulatory Visit: Payer: Self-pay

## 2021-06-07 DIAGNOSIS — J479 Bronchiectasis, uncomplicated: Secondary | ICD-10-CM

## 2021-06-07 NOTE — Patient Instructions (Addendum)
Change pepcid to where you take it after breakfast and after supper or bedtime  The other option to take prilosec or similar medication 30- 60 min before your fist meal.  GERD (REFLUX)  is an extremely common cause of respiratory symptoms just like yours , many times with no obvious heartburn at all.    It can be treated with medication, but also with lifestyle changes including elevation of the head of your bed (ideally with 6 -8inch blocks (also called risers)  under the headboard of your bed),  Smoking cessation, avoidance of late meals, excessive alcohol, and avoid fatty foods, chocolate, peppermint, colas, red wine, and acidic juices such as orange juice.  NO MINT OR MENTHOL PRODUCTS SO NO COUGH DROPS  USE SUGARLESS CANDY INSTEAD (Jolley ranchers or Stover's or Life Savers) or even ice chips will also do - the key is to swallow to prevent all throat clearing. NO OIL BASED VITAMINS - use powdered substitutes.  Avoid fish oil when coughing.   Please schedule a follow up visit in 6  months but call sooner if needed

## 2021-06-08 ENCOUNTER — Encounter: Payer: Self-pay | Admitting: Internal Medicine

## 2021-06-08 NOTE — Assessment & Plan Note (Addendum)
See ct chest 11/13/15 c/w RML bronchiectasis/ mpns - CxR 09/16/2016  ? LUL nodule >   - CT 12/23/16 > improved - CT 03/24/21 Scattered areas of cylindrical and varicose bronchiectasis, thickening of the peribronchovascular interstitium and widespread peribronchovascular micro and macro nodularity are noted throughout the lungs bilaterally. Focal subpleural reticulation is also noted in the anterior aspect of the left upper lobe deep to the left breast, likely from prior radiation therapy to the left breast. Surgical clips in the lateral aspect of the left breast, likely from prior lumpectomy and axillary lymph node dissection. Within the visualized portions of the thorax there is no acute consolidative airspace disease  - Labs ordered 06/07/2021  :  allergy profile   alpha one AT phenotype   - Quant TB  06/07/2021 >>> - Dewitt Rota 06/07/2021 >>>  This is an extremely common benign condition in the elderly and does not warrant aggressive eval/ rx at this point unless there is a clinical correlation suggesting unaddressed pulmonary infection (purulent sputum, night sweats, unintended wt loss, doe) or evolution of  obvious changes on plain cxr (as opposed to serial CT, which is way over sensitive to make clinical decisions re intervention and treatment in the elderly, who tend to tolerate both dx and treatment poorly) .   For now would apply the lowest hanging fruit concept and focus on rx of over GERD but on return complete the w/u with pfts    with sputum for afb and routine cultures for flares   Discussed in detail all the  indications, usual  risks and alternatives  relative to the benefits with patient who agrees to proceed with w/u as outlined.            Each maintenance medication was reviewed in detail including emphasizing most importantly the difference between maintenance and prns and under what circumstances the prns are to be triggered using an action plan format where  appropriate.  Total time for H and P, chart review, counseling, and generating customized AVS unique to this office visit / same day charting = 30 min for pt not seen in > 3 y in this clinic

## 2021-06-12 LAB — LIPID PANEL
Chol/HDL Ratio: 2.5 ratio (ref 0.0–4.4)
Cholesterol, Total: 106 mg/dL (ref 100–199)
HDL: 42 mg/dL (ref >39)
LDL Chol Calc (NIH): 49 mg/dL (ref 0–99)
Triglycerides: 71 mg/dL (ref 0–149)
VLDL Cholesterol Cal: 15 mg/dL (ref 5–40)

## 2021-06-12 LAB — HEPATIC FUNCTION PANEL
ALT: 22 IU/L (ref 0–32)
AST: 19 IU/L (ref 0–40)
Albumin: 4.3 g/dL (ref 3.7–4.7)
Alkaline Phosphatase: 96 IU/L (ref 44–121)
Bilirubin Total: 0.6 mg/dL (ref 0.0–1.2)
Bilirubin, Direct: 0.19 mg/dL (ref 0.00–0.40)
Total Protein: 6.1 g/dL (ref 6.0–8.5)

## 2021-08-24 ENCOUNTER — Telehealth: Payer: Self-pay

## 2021-08-24 NOTE — Telephone Encounter (Signed)
Pt calling to report left sided groin pains x 2 months now. Bothers her specifically when climbing stairs and changing positions while resting/sleeping. Reported she tried contacting PCP but they are currently out of town. Pt denies any lumps/bumps in area but reports she has noticed some bruising. Also reports she has done some lifting lately, not sure if its related. Please advise.  ?

## 2021-08-25 NOTE — Telephone Encounter (Signed)
I would recommend that she f/u with primary or urgent care.  ?

## 2021-08-26 NOTE — Telephone Encounter (Signed)
Patient informed. 

## 2021-08-31 ENCOUNTER — Ambulatory Visit
Admission: RE | Admit: 2021-08-31 | Discharge: 2021-08-31 | Disposition: A | Discharge status: Home / Self Care | Payer: Medicare Other | Source: Ambulatory Visit | Attending: Family Medicine | Admitting: Family Medicine

## 2021-08-31 ENCOUNTER — Other Ambulatory Visit: Payer: Self-pay | Admitting: Family Medicine

## 2021-08-31 DIAGNOSIS — R1032 Left lower quadrant pain: Secondary | ICD-10-CM

## 2021-10-01 ENCOUNTER — Ambulatory Visit: Payer: Medicare Other | Attending: Family Medicine | Admitting: Physical Therapy

## 2021-10-01 ENCOUNTER — Encounter: Payer: Self-pay | Admitting: Physical Therapy

## 2021-10-01 DIAGNOSIS — M79605 Pain in left leg: Secondary | ICD-10-CM | POA: Diagnosis present

## 2021-10-01 DIAGNOSIS — R29898 Other symptoms and signs involving the musculoskeletal system: Secondary | ICD-10-CM | POA: Diagnosis present

## 2021-10-01 DIAGNOSIS — M6281 Muscle weakness (generalized): Secondary | ICD-10-CM | POA: Diagnosis present

## 2021-10-01 NOTE — Therapy (Signed)
OUTPATIENT PHYSICAL THERAPY THORACOLUMBAR EVALUATION   Patient Name: Paula Dawson MRN: [id number] DOB:1946-04-27, 76 y.o., female Today's Date: 10/01/2021   PT End of Session - 10/01/21 1000     Visit Number 1    Number of Visits 13    Date for PT Re-Evaluation 11/12/21    Authorization Type [employer]    Authorization Time Period 10/01/21 to 11/12/21    PT Start Time 0846    PT Stop Time 0928    PT Time Calculation (min) 42 min    Activity Tolerance Patient tolerated treatment well    Behavior During Therapy [facility] for tasks assessed/performed             Past Medical History:  Diagnosis Date   Anemia    Aortic atherosclerosis ([place]) 12/23/2016   Noted on CT   Arthritis    BRCA gene mutation negative 03/2016   Breast cancer ([place]) 2017   Left Breast Cancer   Breast cancer in female [facility]) 12/2015   left breast cancer, Estrogen negative, progesterone slight positive   Bronchiectasis ([facility])    Complication of anesthesia    Dry eye syndrome    Dyslipidemia    Dyspnea    Early cataract    Endometrial polyp    Family history of breast cancer    Family history of ovarian cancer    Family history of prostate cancer    Gall stones    GERD (gastroesophageal reflux disease)    Hematuria 03/09/2018   History of bronchitis    History of hiatal hernia    History of kidney stones 03/09/2018   HSV infection    pos I and II   Hypothyroidism    Internal and external hemorrhoids without complication 51/76/1607   noted on colonoscopy    Lymphedema    Left breast   Macular degeneration of both eyes    Osteopenia    Osteoporosis 1998   Off Fosomax 2008, Prolia 03/17/11, 09/14/11, 03/16/12   Persistent dry cough    Personal history of radiation therapy 2017   Left Breast Cancer   PMB (postmenopausal bleeding)    PONV (postoperative nausea and vomiting)    Pulmonary nodule 11/13/2015   10 to 11 mm subpleural pulmonary nodules in the right middle and right upper lobes    Shoulder  impingement, right 11/22/2016   Vitamin D deficiency    Wears glasses    Past Surgical History:  Procedure Laterality Date   AUGMENTATION MAMMAPLASTY  1981   BREAST IMPLANT REMOVAL  1982   BREAST LUMPECTOMY Left 02/11/2016   BREAST LUMPECTOMY WITH RADIOACTIVE SEED AND SENTINEL LYMPH NODE BIOPSY Left 02/11/2016   Procedure: LEFT BREAST LUMPECTOMY WITH RADIOACTIVE SEED AND LEFT SENTINEL LYMPH NODE BIOPSY;  Surgeon: [name], MD;  Location: [facility];  Service: General;  Laterality: Left;   COLONOSCOPY  10/04   Dr Watt Climes   COLONOSCOPY  04/07/08   normal - Dr. Watt Climes   COLONOSCOPY  04/08/2013   DILATATION & CURETTAGE/HYSTEROSCOPY WITH MYOSURE N/A 03/19/2018   Procedure: DILATATION & CURETTAGE/HYSTEROSCOPY;  Surgeon: Salvadore Dom, MD;  Location: [facility];  Service: Gynecology;  Laterality: N/A;   ELBOW SURGERY Right 12/1997   ESOPHAGOGASTRODUODENOSCOPY ENDOSCOPY  08/2012   [place] hernia with GERD   EXCISION OF BREAST BIOPSY Right 10/19/2017   Procedure: EXCISIONAL BIOPSY OF RIGHT BREAST;  Surgeon: [name], MD;  Location: [facility];  Service: General;  Laterality: Right;   FOOT NEUROMA SURGERY  Left 2004?   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   Genetic testing 03/22/2016   Family history of breast cancer    Family history of ovarian cancer    Family history of prostate cancer    Breast cancer of upper-outer quadrant of left female breast ([place]) 01/20/2016   Bronchiectasis without acute exacerbation ([place])  with MPNs 12/11/2015    PCP: Harlan Stains   REFERRING PROVIDER: Harlan Stains, MD   REFERRING DIAG: R10.32 (ICD-10-CM) - Left lower quadrant pain   Rationale for Evaluation and Treatment  Rehabilitation  THERAPY DIAG:  Pain in left leg  Muscle weakness (generalized)  Other symptoms and signs involving the musculoskeletal system  ONSET DATE: 09/21/2021   SUBJECTIVE:                                                                                                                                                                                           SUBJECTIVE STATEMENT: I noticed this pain starting a couple of months ago, I was lifting some heavy pots maybe 20#. It has not gotten better. When I'm just walking there is a lot of discomfort, when I'm trying to sleep my left hip/groin hurts when I change positions. I can't find a comfortable spot. It does get better after I've been up moving for a bit. No clicking or locking in the hip.  PERTINENT HISTORY:  Osteoporosis, osteopenia   PAIN:  Are you having pain? No 0/10. 5/10 at worst.    PRECAUTIONS: None  WEIGHT BEARING RESTRICTIONS No  FALLS:  Has patient fallen in last 6 months? No  LIVING ENVIRONMENT: Lives with: lives alone Lives in: Other townhome  Stairs: Yes: External: 10 steps; can reach both Has following equipment at home: None  OCCUPATION: retired   PLOF: Independent, Independent with basic ADLs, Independent with gait, and Independent with transfers  PATIENT GOALS get rid of pain   OBJECTIVE:   DIAGNOSTIC FINDINGS:  FINDINGS: There is no evidence of hip fracture or dislocation. There is no evidence of arthropathy or other focal bone abnormality.   IMPRESSION: Negative.  PATIENT SURVEYS:  FOTO 54  SCREENING FOR RED FLAGS: Bowel or bladder incontinence: No Spinal tumors: No Cauda equina syndrome: No Compression fracture: No Abdominal aneurysm: No  COGNITION:  Overall cognitive status: Within functional limits for tasks assessed     SENSATION: Not tested  MUSCLE LENGTH: Quads severe limitation B  Hip flexors moderate limitation B  Hamstrings mild limitation B Piriformis OK B,  had groin pain with testing L hip  POSTURE: rounded shoulders and forward head  PALPATION: No significant areas TTP posterior chain  LUMBAR ROM:   Active  A/PROM  eval  Flexion WNL   Extension WNL  mild back pain   Right lateral flexion WNL mild back pain   Left lateral flexion WNL  mild back pain   Right rotation   Left rotation    (Blank rows = not tested)     LOWER EXTREMITY MMT:    MMT Right eval Left eval  Hip flexion 3/5 3/5  Hip extension 2/5 2/5  Hip abduction 3+/5 3+/5  Hip adduction    Hip internal rotation    Hip external rotation    Knee flexion 3/5 3/5  Knee extension 4+/5 4+/5  Ankle dorsiflexion 5/5 5/5  Ankle plantarflexion    Ankle inversion    Ankle eversion     (Blank rows = not tested)  LUMBAR SPECIAL TESTS:  Straight leg raise test: Negative, FABER test: Positive, and scour mild positive , FADIR positive L     TODAY'S TREATMENT  Nustep L3 x6 minutes BLEs only   Bridges 1x10  Supine clams red TB 1x10 Hip flexor stretch 1x30 seconds B    PATIENT EDUCATION:  Education details: exam findings, POC, HEP Person educated: Patient Education method: Mining engineer Education comprehension: verbalized understanding and returned demonstration   HOME EXERCISE PROGRAM: [id number]  ASSESSMENT:  CLINICAL IMPRESSION: Patient is a 76 y.o. female who was seen today for physical therapy evaluation and treatment for L groin pain. Exam reveals severe posterior chain weakness and anterior chain muscle tightness- really has a severe muscle imbalance and I think that in combination with her recently trying to do a lot of lifting heavy pots and items at home may have caused her pain. Will make every effort to help reduce pain and correct functional muscle imbalances here.    OBJECTIVE IMPAIRMENTS Abnormal gait, decreased activity tolerance, decreased mobility, difficulty walking, decreased ROM, decreased strength, hypomobility, increased fascial  restrictions, impaired flexibility, postural dysfunction, and pain.   ACTIVITY LIMITATIONS laundry, community activity, and yard work.   PERSONAL FACTORS Age, Education, Fitness, Social background, and Time since onset of injury/illness/exacerbation are also affecting patient's functional outcome.    REHAB POTENTIAL: Good  CLINICAL DECISION MAKING: Stable/uncomplicated  EVALUATION COMPLEXITY: Low   GOALS: Goals reviewed with patient? No  SHORT TERM GOALS: Target date: 10/22/2021  Will be independent with progressive HEP  Baseline: Goal status: INITIAL  2.  Will demonstrate correct functional lifting mechanics with 0#  Baseline:  Goal status: INITIAL  3.  Quad and hip flexor flexibility to improve by 50% to help correct mm imbalance  Baseline:  Goal status: INITIAL   LONG TERM GOALS: Target date: 11/12/2021  MMT to improve by one grade in all weak groups  Baseline:  Goal status: INITIAL  2.  Will demonstrate correct functional lifting mechanics with 20#  Baseline:  Goal status: INITIAL  3.  Will not be woken by pain at night when trying to sleep  Baseline:  Goal status: INITIAL  4.  FOTO score to improve by at least 10 points to show improvement in condition Baseline:  Goal status: INITIAL  5  PLAN: PT FREQUENCY: 2x/week  PT DURATION: 6 weeks  PLANNED INTERVENTIONS: Therapeutic exercises, Therapeutic activity, Neuromuscular re-education, Balance training, Gait training,  Patient/Family education, Joint mobilization, Stair training, Dry Needling, Electrical stimulation, Cryotherapy, Moist heat, Taping, Ultrasound, Ionotophoresis 75m/ml Dexamethasone, Manual therapy, and Re-evaluation.  PLAN FOR NEXT SESSION: correct anterior chain tightness and posterior chain weakness, work on functional lifting mechanics   Caitlain Tweed U PT, DPT, PN2   Supplemental Physical Therapist [place]

## 2021-10-12 ENCOUNTER — Ambulatory Visit: Payer: Medicare Other | Admitting: Physical Therapy

## 2021-10-12 DIAGNOSIS — M6281 Muscle weakness (generalized): Secondary | ICD-10-CM

## 2021-10-12 DIAGNOSIS — M79605 Pain in left leg: Secondary | ICD-10-CM | POA: Diagnosis not present

## 2021-10-12 NOTE — Therapy (Signed)
[address] [place], [place], [address] Phone: [phone]   Fax:  [phone]  Physical Therapy Treatment  Patient Details  Name: SUI KASPAREK MRN: [id number] Date of Birth: October 22, 1945 No data recorded  Encounter Date: 10/12/2021   PT End of Session - 10/12/21 1128     Visit Number 2    Number of Visits 13    Date for PT Re-Evaluation 11/12/21    Authorization Type UHC    Authorization Time Period 10/01/21 to 11/12/21    PT Start Time 1100    PT Stop Time 5681    PT Time Calculation (min) 45 min             Past Medical History:  Diagnosis Date   Anemia    Aortic atherosclerosis ([place]) 12/23/2016   Noted on CT   Arthritis    BRCA gene mutation negative 03/2016   Breast cancer ([place]) 2017   Left Breast Cancer   Breast cancer in female [facility]) 12/2015   left breast cancer, Estrogen negative, progesterone slight positive   Bronchiectasis ([place])    Complication of anesthesia    Dry eye syndrome    Dyslipidemia    Dyspnea    Early cataract    Endometrial polyp    Family history of breast cancer    Family history of ovarian cancer    Family history of prostate cancer    Gall stones    GERD (gastroesophageal reflux disease)    Hematuria 03/09/2018   History of bronchitis    History of hiatal hernia    History of kidney stones 03/09/2018   HSV infection    pos I and II   Hypothyroidism    Internal and external hemorrhoids without complication 27/51/7001   noted on colonoscopy    Lymphedema    Left breast   Macular degeneration of both eyes    Osteopenia    Osteoporosis 1998   Off Fosomax 2008, Prolia 03/17/11, 09/14/11, 03/16/12   Persistent dry cough    Personal history of radiation therapy 2017   Left Breast Cancer   PMB (postmenopausal bleeding)    PONV (postoperative nausea and vomiting)    Pulmonary nodule 11/13/2015   10 to 11 mm subpleural pulmonary nodules in the right middle and  right upper lobes    Shoulder impingement, right 11/22/2016   Vitamin D deficiency    Wears glasses     Past Surgical History:  Procedure Laterality Date   AUGMENTATION MAMMAPLASTY  1981   BREAST IMPLANT REMOVAL  1982   BREAST LUMPECTOMY Left 02/11/2016   BREAST LUMPECTOMY WITH RADIOACTIVE SEED AND SENTINEL LYMPH NODE BIOPSY Left 02/11/2016   Procedure: LEFT BREAST LUMPECTOMY WITH RADIOACTIVE SEED AND LEFT SENTINEL LYMPH NODE BIOPSY;  Surgeon: [name], MD;  Location: [facility];  Service: General;  Laterality: Left;   COLONOSCOPY  10/04   Dr Watt Climes   COLONOSCOPY  04/07/08   normal - Dr. Watt Climes   COLONOSCOPY  04/08/2013   DILATATION & CURETTAGE/HYSTEROSCOPY WITH MYOSURE N/A 03/19/2018   Procedure: DILATATION & CURETTAGE/HYSTEROSCOPY;  Surgeon: Salvadore Dom, MD;  Location: [facility];  Service: Gynecology;  Laterality: N/A;   ELBOW SURGERY Right 12/1997   ESOPHAGOGASTRODUODENOSCOPY ENDOSCOPY  08/2012   [place] hernia with GERD   EXCISION OF BREAST BIOPSY Right 10/19/2017   Procedure: EXCISIONAL BIOPSY OF RIGHT BREAST;  Surgeon: [name], MD;  Location: [facility];  Service: General;  Laterality: Right;   FOOT NEUROMA SURGERY Left 2004?   TONSILLECTOMY AND ADENOIDECTOMY     TUBAL LIGATION  1981    There were no vitals filed for this visit.   Subjective Assessment - 10/12/21 1059     Subjective doing HEP and not sure I see any chnages. mornings are bad    Currently in Pain? Yes    Pain Score 8     Pain Location Leg    Pain Orientation Left;Anterior;Upper                               [facility] Adult PT Treatment/Exercise - 10/12/21 0001       Exercises   Exercises Knee/Hip;Lumbar      Lumbar Exercises: Supine   Pelvic Tilt 10 reps    Bridge Compliant;10 reps    Straight Leg Raise 10 reps   BIL red tband 2nd set with abd   Other Supine Lumbar Exercises feet on ball bridge, KTC and obl 10 x      Modalities    Modalities Moist Heat;Electrical Stimulation      Moist Heat Therapy   Number Minutes Moist Heat 15 Minutes    Moist Heat Location Other (comment)   left thigh     Electrical Stimulation   Electrical Stimulation Location left thigh    Electrical Stimulation Action IFC    Electrical Stimulation Parameters supine    Electrical Stimulation Goals Pain      Manual Therapy   Manual Therapy Soft tissue mobilization;Passive ROM    Soft tissue mobilization Left thigh    Passive ROM thigh an dhip flex                                 Plan - 10/12/21 1130     Clinical Impression Statement pt arrived with pain 8/10 stating doing HEP but maybe overdoing? pt very tender left ant thigh and tight when stretched into ext but when in prone mnimal to no tightness. STM and PROM d/t tight and tender added estim with MH. 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 [place], [place], [address] Phone: [phone]    Fax:  [phone]  Name: TITIANA SEVERA MRN: [id number] Date of Birth: [birth year]

## 2021-10-18 ENCOUNTER — Ambulatory Visit: Payer: Medicare Other | Attending: Family Medicine | Admitting: Physical Therapy

## 2021-10-18 ENCOUNTER — Encounter: Payer: Self-pay | Admitting: Physical Therapy

## 2021-10-18 DIAGNOSIS — M79605 Pain in left leg: Secondary | ICD-10-CM | POA: Insufficient documentation

## 2021-10-18 DIAGNOSIS — M6281 Muscle weakness (generalized): Secondary | ICD-10-CM | POA: Diagnosis present

## 2021-10-18 DIAGNOSIS — R29898 Other symptoms and signs involving the musculoskeletal system: Secondary | ICD-10-CM | POA: Diagnosis present

## 2021-10-18 NOTE — Therapy (Signed)
[address] [place], [place], [address] Phone: [phone]   Fax:  [phone]  Physical Therapy Treatment  Patient Details  Name: Paula Dawson MRN: [id number] Date of Birth: February 24, 1946 No data recorded  Encounter Date: 10/18/2021   PT End of Session - 10/18/21 1528     Visit Number 3    Number of Visits 13    Date for PT Re-Evaluation 11/12/21    Authorization Type UHC    Authorization Time Period 10/01/21 to 11/12/21    PT Start Time 1446    PT Stop Time 1528    PT Time Calculation (min) 42 min    Activity Tolerance Patient tolerated treatment well    Behavior During Therapy [facility] for tasks assessed/performed             Past Medical History:  Diagnosis Date   Anemia    Aortic atherosclerosis ([facility]) 12/23/2016   Noted on CT   Arthritis    BRCA gene mutation negative 03/2016   Breast cancer ([facility]) 2017   Left Breast Cancer   Breast cancer in female [facility]) 12/2015   left breast cancer, Estrogen negative, progesterone slight positive   Bronchiectasis ([place])    Complication of anesthesia    Dry eye syndrome    Dyslipidemia    Dyspnea    Early cataract    Endometrial polyp    Family history of breast cancer    Family history of ovarian cancer    Family history of prostate cancer    Gall stones    GERD (gastroesophageal reflux disease)    Hematuria 03/09/2018   History of bronchitis    History of hiatal hernia    History of kidney stones 03/09/2018   HSV infection    pos I and II   Hypothyroidism    Internal and external hemorrhoids without complication 78/58/8502   noted on colonoscopy    Lymphedema    Left breast   Macular degeneration of both eyes    Osteopenia    Osteoporosis 1998   Off Fosomax 2008, Prolia 03/17/11, 09/14/11, 03/16/12   Persistent dry cough    Personal history of radiation therapy 2017   Left Breast Cancer   PMB (postmenopausal bleeding)    PONV (postoperative nausea  and vomiting)    Pulmonary nodule 11/13/2015   10 to 11 mm subpleural pulmonary nodules in the right middle and right upper lobes    Shoulder impingement, right 11/22/2016   Vitamin D deficiency    Wears glasses     Past Surgical History:  Procedure Laterality Date   AUGMENTATION MAMMAPLASTY  1981   BREAST IMPLANT REMOVAL  1982   BREAST LUMPECTOMY Left 02/11/2016   BREAST LUMPECTOMY WITH RADIOACTIVE SEED AND SENTINEL LYMPH NODE BIOPSY Left 02/11/2016   Procedure: LEFT BREAST LUMPECTOMY WITH RADIOACTIVE SEED AND LEFT SENTINEL LYMPH NODE BIOPSY;  Surgeon: [name], MD;  Location: [facility];  Service: General;  Laterality: Left;   COLONOSCOPY  10/04   Dr Watt Climes   COLONOSCOPY  04/07/08   normal - Dr. Watt Climes   COLONOSCOPY  04/08/2013   DILATATION & CURETTAGE/HYSTEROSCOPY WITH MYOSURE N/A 03/19/2018   Procedure: DILATATION & CURETTAGE/HYSTEROSCOPY;  Surgeon: Salvadore Dom, MD;  Location: [facility];  Service: Gynecology;  Laterality: N/A;   ELBOW SURGERY Right 12/1997   ESOPHAGOGASTRODUODENOSCOPY ENDOSCOPY  08/2012   [place] hernia with GERD   EXCISION OF BREAST BIOPSY Right 10/19/2017   Procedure:  EXCISIONAL BIOPSY OF RIGHT BREAST;  Surgeon: [name], MD;  Location: [facility];  Service: General;  Laterality: Right;   FOOT NEUROMA SURGERY Left 2004?   TONSILLECTOMY AND ADENOIDECTOMY     TUBAL LIGATION  1981    There were no vitals filed for this visit.   Subjective Assessment - 10/18/21 1448     Subjective I don't think therapy is helping. When I get up in the mornings I can hardly walk. What's going on is getting more painful and other things are getting painful too. These things are improving after awhile I start walking and just start getting around. If I sit too long its hard to get around. When I'm sitting its OK but when I get up and around in the afternoons it gets very painful. The estim really didn't do much for me. Angie was telling me about  the dry needling but I don't think I'm ready to try it just yet. Pain is fine now but when I move around it can to 6/10 in the afternoon.    Currently in Pain? No/denies                               [facility] Adult PT Treatment/Exercise - 10/18/21 0001       Lumbar Exercises: Stretches   Passive Hamstring Stretch Left;3 reps;30 seconds    Piriformis Stretch Left;3 reps;30 seconds    Other Lumbar Stretch Exercise SKTC stretch 3x10 seconds L      Lumbar Exercises: Aerobic   Nustep Nustep L2 x6 minutes      Lumbar Exercises: Supine   Pelvic Tilt 15 reps    Pelvic Tilt Limitations 3 second holds    Bridge 15 reps    Other Supine Lumbar Exercises bridge + clam red TB 1x10      Lumbar Exercises: Sidelying   Clam Both;10 reps    Clam Limitations red TB      Lumbar Exercises: Prone   Straight Leg Raise 10 reps      Manual Therapy   Manual Therapy Manual Traction    Manual Traction L hip axial distraction and osciallation x3 rounds                     PT Education - 10/18/21 1528     Education Details PT interventions take/need time to work, potential POC; use of moist heat and appropriate time    Person(s) Educated Patient    Methods Explanation    Comprehension Verbalized understanding                        Plan - 10/18/21 1528     Clinical Impression Statement Paula Dawson arrives today still really not feeling like PT is helping- educated we are still early on and often times treatments in PT need time to work. She is agreeable to continuing services, just did not feel any difference from the estim and would like to hold off on trying dry needling.  Warmed up on the Nustep, otherwise focused on gentle stretching and strengthening today. Hopefully we can identify something that will more specifically target her pain in the next couple of sessions.    PT Next Visit Plan continue working on flexiblity and strenth- does she want to conitnue  PT?             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 [place], [place], [address] Phone: [phone]   Fax:  [phone]  Name: Paula Dawson MRN: [id number] Date of Birth: 04/14/1946

## 2021-10-20 ENCOUNTER — Encounter: Payer: Self-pay | Admitting: Physical Therapy

## 2021-10-20 ENCOUNTER — Ambulatory Visit: Payer: Medicare Other | Admitting: Physical Therapy

## 2021-10-20 DIAGNOSIS — M6281 Muscle weakness (generalized): Secondary | ICD-10-CM

## 2021-10-20 DIAGNOSIS — R29898 Other symptoms and signs involving the musculoskeletal system: Secondary | ICD-10-CM

## 2021-10-20 DIAGNOSIS — M79605 Pain in left leg: Secondary | ICD-10-CM

## 2021-10-20 NOTE — Therapy (Signed)
[address]. [place], [place], [address] Phone: [phone]   Fax:  [phone]  Patient Details  Name: Paula Dawson MRN: [id number] Date of Birth: 1945/11/07 Referring Provider:  Harlan Stains, MD  Encounter Date: 10/20/2021  Paula Dawson arrives today feeling about the same- didn't feel any improvement after last treatment, still having quite a bit of pain and does not want to try dry needling. Tells me she really is not seeing any improvement from therapy in general.   Discussed how therapy can sometimes take 8-9 sessions to really start to see improvements (building strength and flexibility), and how she is very early on in course of care so its not surprising that she is not seeing much improvement yet, but very well may if she were to continue.  Offered her either a treatment session similar to last time, versus trying dry needling, versus holding therapy for now- she chose to hold therapy for now.  We will keep her on hold so she can return to this clinic for care if she changes her mind about PT for her pain.   No charge for today's visit.   Ann Lions PT, DPT, PN2   Supplemental Physical Therapist [address]. [place], [place], [address] Phone: [phone]   Fax:  [phone]

## 2021-10-25 ENCOUNTER — Ambulatory Visit: Payer: Medicare Other | Admitting: Physical Therapy

## 2021-10-27 ENCOUNTER — Ambulatory Visit: Payer: Medicare Other | Admitting: Physical Therapy

## 2022-01-13 ENCOUNTER — Other Ambulatory Visit: Payer: Self-pay | Admitting: Obstetrics and Gynecology

## 2022-01-13 DIAGNOSIS — A6000 Herpesviral infection of urogenital system, unspecified: Secondary | ICD-10-CM

## 2022-01-13 NOTE — Telephone Encounter (Signed)
Last AEX 01/08/2021--scheduled for 02/11/2022

## 2022-01-18 ENCOUNTER — Other Ambulatory Visit: Payer: Self-pay | Admitting: Family Medicine

## 2022-01-18 DIAGNOSIS — Z1231 Encounter for screening mammogram for malignant neoplasm of breast: Secondary | ICD-10-CM

## 2022-01-19 ENCOUNTER — Ambulatory Visit: Payer: Medicare Other | Admitting: Obstetrics and Gynecology

## 2022-01-25 ENCOUNTER — Ambulatory Visit: Payer: Medicare Other | Admitting: Obstetrics and Gynecology

## 2022-02-08 NOTE — Progress Notes (Signed)
76 y.o. G13P2001 Divorced White or Caucasian Not Hispanic or Latino female here for breast and pelvic exam.  No vaginal bleeding.   Complains of tender abdomen intermittent x's 2 months. She notices some discomfort in her lower abdomen most day, tends to occur in the afternoon. She hasn't noticed any specific foods that cause pain. BM's are typically daily, she can get diarrhea in the am (stress related). Getting diarrhea ~1 x a week. Eating okay. Some bloating and gas. No nausea.    Denies urinary complaints, fevers.   H/o DCIS in 9/17, s/p lumpectomy and radiation. Negative genetic testing. Stopped tamoxifen after 3 years.    H/O HSV, needs a refill on her valtrex. She is on it daily, still gets an outbreak ~q 3 months.   H/O a grade 2 cystocele and grade 1 uterine prolapse. Tried a #3 ring pessary, but it worsened her incontinence so it was left out. Tolerable, occasionally reduces the prolapse. Not leaking urine.   Patient's last menstrual period was 05/16/2000 (approximate).          Sexually active: No.  The current method of family planning is status post menopausal. Exercising: Yes.    yardwork Smoker:  no  Health Maintenance: Pap:  03/02/18 wnl History of abnormal Pap:  no MMG:  scheduled 02/18/22 BMD:   05/01/20 osteopenic T score -2.3, on prolia with primary Colonoscopy: 12/2018, repeat 5 years. TDaP:  2022 Gardasil: n/a   reports that she has never smoked. She has never used smokeless tobacco. She reports that she does not drink alcohol and does not use drugs.  Her Son lives in [place], 2 grandsons  (28 and 7).   Past Medical History:  Diagnosis Date   Anemia    Aortic atherosclerosis ([place]) 12/23/2016   Noted on CT   Arthritis    BRCA gene mutation negative 03/2016   Breast cancer ([place]) 2017   Left Breast Cancer   Breast cancer in female [facility]) 12/2015   left breast cancer, Estrogen negative, progesterone slight positive   Bronchiectasis ([facility])    Complication of  anesthesia    Dry eye syndrome    Dyslipidemia    Dyspnea    Early cataract    Endometrial polyp    Family history of breast cancer    Family history of ovarian cancer    Family history of prostate cancer    Gall stones    GERD (gastroesophageal reflux disease)    Hematuria 03/09/2018   History of bronchitis    History of hiatal hernia    History of kidney stones 03/09/2018   HSV infection    pos I and II   Hypothyroidism    Internal and external hemorrhoids without complication 23/53/6144   noted on colonoscopy    Lymphedema    Left breast   Macular degeneration of both eyes    Osteopenia    Osteoporosis 1998   Off Fosomax 2008, Prolia 03/17/11, 09/14/11, 03/16/12   Persistent dry cough    Personal history of radiation therapy 2017   Left Breast Cancer   PMB (postmenopausal bleeding)    PONV (postoperative nausea and vomiting)    Pulmonary nodule 11/13/2015   10 to 11 mm subpleural pulmonary nodules in the right middle and right upper lobes    Shoulder impingement, right 11/22/2016   Vitamin D deficiency    Wears glasses     Past Surgical History:  Procedure Laterality Date   [place]  IMPLANT REMOVAL  1982   BREAST LUMPECTOMY Left 02/11/2016   BREAST LUMPECTOMY WITH RADIOACTIVE SEED AND SENTINEL LYMPH NODE BIOPSY Left 02/11/2016   Procedure: LEFT BREAST LUMPECTOMY WITH RADIOACTIVE SEED AND LEFT SENTINEL LYMPH NODE BIOPSY;  Surgeon: [name], MD;  Location: [facility];  Service: General;  Laterality: Left;   COLONOSCOPY  10/04   Dr Watt Climes   COLONOSCOPY  04/07/08   normal - Dr. Watt Climes   COLONOSCOPY  04/08/2013   DILATATION & CURETTAGE/HYSTEROSCOPY WITH MYOSURE N/A 03/19/2018   Procedure: DILATATION & CURETTAGE/HYSTEROSCOPY;  Surgeon: Salvadore Dom, MD;  Location: [facility];  Service: Gynecology;  Laterality: N/A;   ELBOW SURGERY Right 12/1997   ESOPHAGOGASTRODUODENOSCOPY ENDOSCOPY  08/2012   [place] hernia with GERD    EXCISION OF BREAST BIOPSY Right 10/19/2017   Procedure: EXCISIONAL BIOPSY OF RIGHT BREAST;  Surgeon: [name], MD;  Location: [facility];  Service: General;  Laterality: Right;   FOOT NEUROMA SURGERY Left 2004?   TONSILLECTOMY AND ADENOIDECTOMY     TUBAL LIGATION  1981    Current Outpatient Medications  Medication Sig Dispense Refill   CALCIUM PO Take by mouth.     Cholecalciferol (VITAMIN D3) 5000 units CAPS Take 1 capsule by mouth daily.     denosumab (PROLIA) 60 MG/ML SOLN injection Inject 60 mg into the skin every 6 (six) months. Administer in upper arm, thigh, or abdomen     famotidine (PEPCID) 20 MG tablet Take 20 mg by mouth 2 (two) times daily.     Ferrous Sulfate (IRON) 28 MG TABS Take 28 mg by mouth daily. Mon, Wed, and Fridays only     Multiple Vitamin (MULTI-VITAMIN PO) Take by mouth 2 (two) times daily as needed. Women's 50 +     Propylene Glycol (SYSTANE BALANCE OP) Apply 1 tablet to eye daily.     RESTASIS 0.05 % ophthalmic emulsion Place 1 drop into both eyes 2 (two) times daily.      rosuvastatin (CRESTOR) 10 MG tablet Take 10 mg by mouth daily.     SYNTHROID 75 MCG tablet Take 75 mcg by mouth daily.     valACYclovir (VALTREX) 500 MG tablet TAKE 1 TABLET DAILY, CAN   INCREASE TO 1 TABLET TWO   TIMES A DAY FOR 3 DAYS IF  YOU HAVE AN OUTBREAK 90 tablet 0   No current facility-administered medications for this visit.    Family History  Problem Relation Age of Onset   Heart failure Mother    Prostate cancer Father 48   Liver cancer Father    Colon polyps Sister    Breast cancer Sister 35   Breast cancer Sister 45   Breast cancer Sister 93       recurrance at 73   Breast cancer Sister 23   Colon polyps Sister    Lung cancer Brother        smoked   Prostate cancer Brother 47   Melanoma Brother    Breast cancer Paternal Aunt    Ovarian cancer Cousin        died at 7   Breast cancer Other        X 32 + age 74, 99 with recurrence, 15, 38   Colon  cancer Other 24  The patient's father died at age 32. He had been diagnosed with prostate "and liver" cancer at the age of 68. The patient's mother died at age 31. The patient had 4 brothers, 5 sisters.  One brother was diagnosed with lung cancer at age 84. She has 4 sisters with breast cancer, one initially diagnosed at age 50, recurring at age 51.  Review of Systems  All other systems reviewed and are negative.   Exam:   BP 112/70 (BP Location: Left Arm, Patient Position: Sitting)   Pulse 75   Ht 5' 1.5" (1.562 m)   Wt 115 lb (52.2 kg)   LMP 05/16/2000 (Approximate)   SpO2 95%   BMI 21.38 kg/m   Weight change: _0 @ Height:   Height: 5' 1.5" (156.2 cm)  Ht Readings from Last 3 Encounters:  02/11/22 5' 1.5" (1.562 m)  06/07/21 _1  (1.575 m)  03/15/21 _2  (1.575 m)    General appearance: alert, cooperative and appears stated age Head: Normocephalic, without obvious abnormality, atraumatic Neck: no adenopathy, supple, symmetrical, trachea midline and thyroid normal to inspection and palpation Lungs: clear to auscultation bilaterally Cardiovascular: regular rate and rhythm Breasts: normal appearance, no masses or tenderness Abdomen: soft, non-tender; non distended,  no masses,  no organomegaly Extremities: extremities normal, atraumatic, no cyanosis or edema Skin: Skin color, texture, turgor normal. No rashes or lesions Lymph nodes: Cervical, supraclavicular, and axillary nodes normal. No abnormal inguinal nodes palpated Neurologic: Grossly normal   Pelvic: External genitalia:  no lesions              Urethra:  normal appearing urethra with no masses, tenderness or lesions              Bartholins and Skenes: normal                 Vagina: atrophic appearing vagina with normal color and discharge, no lesions, not evaluated for prolapse today              Cervix: no lesions               Bimanual Exam:  Uterus:  normal size, contour, position, consistency, mobility,  non-tender              Adnexa: no mass, fullness, tenderness               Rectovaginal: Confirms               Anus:  normal sphincter tone, no lesions  Santiago Glad, CMA chaperoned for the exam.  1. Encounter for breast and pelvic examination Discussed breast self exam Discussed calcium and vit D intake Labs with primary Colonoscopy UTD DEXA and prolia with primary  2. Herpes simplex infection of genitourinary system - valACYclovir (VALTREX) 500 MG tablet; TAKE 1 TABLET DAILY, CAN   INCREASE TO 1 TABLET TWO   TIMES A DAY FOR 3 DAYS IF  YOU HAVE AN OUTBREAK  Dispense: 90 tablet; Refill: 3  3. History of breast cancer We discussed yearly f/u  4. Lower abdominal pain Distended on exam Try gas-ex or beano, discussed watching her diet and pain Discussed trying to eliminate dairy and then gluten to see if it helps If pain persists recommended she f/u with her primary We discussed possible pelvic u/s if no other source for her pain was identified

## 2022-02-11 ENCOUNTER — Ambulatory Visit (INDEPENDENT_AMBULATORY_CARE_PROVIDER_SITE_OTHER): Payer: Medicare Other | Admitting: Obstetrics and Gynecology

## 2022-02-11 ENCOUNTER — Encounter: Payer: Self-pay | Admitting: Obstetrics and Gynecology

## 2022-02-11 VITALS — BP 112/70 | HR 75 | Ht 61.5 in | Wt 115.0 lb

## 2022-02-11 DIAGNOSIS — Z01419 Encounter for gynecological examination (general) (routine) without abnormal findings: Secondary | ICD-10-CM

## 2022-02-11 DIAGNOSIS — Z9189 Other specified personal risk factors, not elsewhere classified: Secondary | ICD-10-CM

## 2022-02-11 DIAGNOSIS — A6000 Herpesviral infection of urogenital system, unspecified: Secondary | ICD-10-CM

## 2022-02-11 DIAGNOSIS — Z853 Personal history of malignant neoplasm of breast: Secondary | ICD-10-CM

## 2022-02-11 DIAGNOSIS — R1030 Lower abdominal pain, unspecified: Secondary | ICD-10-CM

## 2022-02-11 MED ORDER — VALACYCLOVIR HCL 500 MG PO TABS: ORAL_TABLET | ORAL | 3 refills | Status: DC

## 2022-02-11 NOTE — Patient Instructions (Addendum)
Abdominal Bloating When you have abdominal bloating, your abdomen may feel full, tight, or painful. It may also look bigger than normal or swollen (distended). Common causes of abdominal bloating include: Swallowing air. Constipation. Problems digesting food. Eating too much. Irritable bowel syndrome. This is a condition that affects the large intestine. Lactose intolerance. This is an inability to digest lactose, a natural sugar in dairy products. Celiac disease. This is a condition that affects the ability to digest gluten, a protein found in some grains. Gastroparesis. This is a condition that slows down the movement of food in the stomach and small intestine. It is more common in people with diabetes mellitus. Gastroesophageal reflux disease (GERD). This is a condition that makes stomach acid flow back into the esophagus. Urinary retention. This means that the body is holding onto urine, and the bladder cannot be emptied all the way. Follow these instructions at home: Eating and drinking Avoid eating too much. Try not to swallow air while talking or eating. Avoid eating while lying down. Avoid these foods and drinks: Foods that cause gas, such as broccoli, cabbage, cauliflower, and baked beans. Carbonated drinks. Hard candy. Chewing gum. Medicines Take over-the-counter and prescription medicines only as told by your health care provider. Take probiotic medicines. These medicines contain live bacteria or yeasts that can help digestion. Take coated peppermint oil capsules. General instructions Try to exercise regularly. Exercise may help to relieve bloating that is caused by gas and relieve constipation. Keep all follow-up visits. This is important. Contact a health care provider if: You have nausea and vomiting. You have diarrhea. You have abdominal pain. You have unusual weight loss or weight gain. You have severe pain, and medicines do not help. Get help right away if: You  have chest pain. You have trouble breathing. You have shortness of breath. You have trouble urinating. You have darker urine than normal. You have blood in your stools or have dark, tarry stools. These symptoms may represent a serious problem that is an emergency. Do not wait to see if the symptoms will go away. Get medical help right away. Call your local emergency services (911 in the [place] Do not drive yourself to the hospital. Summary Abdominal bloating means that the abdomen is swollen. Common causes of abdominal bloating are swallowing air, constipation, and problems digesting food. Avoid eating too much and avoid swallowing air. Avoid foods that cause gas, carbonated drinks, hard candy, and chewing gum. This information is not intended to replace advice given to you by your health care provider. Make sure you discuss any questions you have with your health care provider. Document Revised: 12/03/2019 Document Reviewed: 12/03/2019 Elsevier Patient Education  [place]   We recommended that you start or continue a regular exercise program for good health. Physical activity is anything that gets your body moving, some is better than none. The CDC recommends 150 minutes per week of Moderate-Intensity Aerobic Activity and 2 or more days of Muscle Strengthening Activity.  Benefits of exercise are limitless: helps weight loss/weight maintenance, improves mood and energy, helps with depression and anxiety, improves sleep, tones and strengthens muscles, improves balance, improves bone density, protects from chronic conditions such as heart disease, high blood pressure and diabetes and so much more. To learn more visit: WhyNotPoker.uy  DIET: Good nutrition starts with a healthy diet of fruits, vegetables, whole grains, and lean protein sources. Drink plenty of water for hydration. Minimize empty calories, sodium, sweets. For more information  about  dietary recommendations visit: GeekRegister.com.ee and http://schaefer-mitchell.com/  ALCOHOL:  Women should limit their alcohol intake to no more than 7 drinks/beers/glasses of wine (combined, not each!) per week. Moderation of alcohol intake to this level decreases your risk of breast cancer and liver damage.  If you are concerned that you may have a problem, or your friends have told you they are concerned about your drinking, there are many resources to help. A well-known program that is free, effective, and available to all people all over the nation is Alcoholics Anonymous.  Check out this site to learn more: BlockTaxes.se   CALCIUM AND VITAMIN D:  Adequate intake of calcium and Vitamin D are recommended for bone health.  You should be getting between 1000-1200 mg of calcium and 800 units of Vitamin D daily between diet and supplements  MAMMOGRAMS:  All women over 40 years old should have a routine mammogram.   COLON CANCER SCREENING: Now recommend starting at age 73. At this time colonoscopy is not covered for routine screening until 50. There are take home tests that can be done between 45-49.   COLONOSCOPY:  Colonoscopy to screen for colon cancer is recommended for all women at age 79.  We know, you hate the idea of the prep.  We agree, BUT, having colon cancer and not knowing it is worse!!  Colon cancer so often starts as a polyp that can be seen and removed at colonscopy, which can quite literally save your life!  And if your first colonoscopy is normal and you have no family history of colon cancer, most women don't have to have it again for 10 years.  Once every ten years, you can do something that may end up saving your life, right?  We will be happy to help you get it scheduled when you are ready.  Be sure to check your insurance coverage so you understand how much it will cost.  It may be covered as a preventative service at no  cost, but you should check your particular policy.      Breast Self-Awareness Breast self-awareness means being familiar with how your breasts look and feel. It involves checking your breasts regularly and reporting any changes to your health care provider. Practicing breast self-awareness is important. A change in your breasts can be a sign of a serious medical problem. Being familiar with how your breasts look and feel allows you to find any problems early, when treatment is more likely to be successful. All women should practice breast self-awareness, including women who have had breast implants. How to do a breast self-exam One way to learn what is normal for your breasts and whether your breasts are changing is to do a breast self-exam. To do a breast self-exam: Look for Changes  Remove all the clothing above your waist. Stand in front of a mirror in a room with good lighting. Put your hands on your hips. Push your hands firmly downward. Compare your breasts in the mirror. Look for differences between them (asymmetry), such as: Differences in shape. Differences in size. Puckers, dips, and bumps in one breast and not the other. Look at each breast for changes in your skin, such as: Redness. Scaly areas. Look for changes in your nipples, such as: Discharge. Bleeding. Dimpling. Redness. A change in position. Feel for Changes Carefully feel your breasts for lumps and changes. It is best to do this while lying on your back on the floor and again while sitting or standing in  the shower or tub with soapy water on your skin. Feel each breast in the following way: Place the arm on the side of the breast you are examining above your head. Feel your breast with the other hand. Start in the nipple area and make  inch (2 cm) overlapping circles to feel your breast. Use the pads of your three middle fingers to do this. Apply light pressure, then medium pressure, then firm pressure. The light  pressure will allow you to feel the tissue closest to the skin. The medium pressure will allow you to feel the tissue that is a little deeper. The firm pressure will allow you to feel the tissue close to the ribs. Continue the overlapping circles, moving downward over the breast until you feel your ribs below your breast. Move one finger-width toward the center of the body. Continue to use the  inch (2 cm) overlapping circles to feel your breast as you move slowly up toward your collarbone. Continue the up and down exam using all three pressures until you reach your armpit.  Write Down What You Find  Write down what is normal for each breast and any changes that you find. Keep a written record with breast changes or normal findings for each breast. By writing this information down, you do not need to depend only on memory for size, tenderness, or location. Write down where you are in your menstrual cycle, if you are still menstruating. If you are having trouble noticing differences in your breasts, do not get discouraged. With time you will become more familiar with the variations in your breasts and more comfortable with the exam. How often should I examine my breasts? Examine your breasts every month. If you are breastfeeding, the best time to examine your breasts is after a feeding or after using a breast pump. If you menstruate, the best time to examine your breasts is 5-7 days after your period is over. During your period, your breasts are lumpier, and it may be more difficult to notice changes. When should I see my health care provider? See your health care provider if you notice: A change in shape or size of your breasts or nipples. A change in the skin of your breast or nipples, such as a reddened or scaly area. Unusual discharge from your nipples. A lump or thick area that was not there before. Pain in your breasts. Anything that concerns you.

## 2022-02-18 ENCOUNTER — Ambulatory Visit
Admission: RE | Admit: 2022-02-18 | Discharge: 2022-02-18 | Disposition: A | Discharge status: Home / Self Care | Payer: Medicare Other | Source: Ambulatory Visit | Attending: Family Medicine | Admitting: Family Medicine

## 2022-02-18 DIAGNOSIS — Z1231 Encounter for screening mammogram for malignant neoplasm of breast: Secondary | ICD-10-CM

## 2022-02-23 ENCOUNTER — Ambulatory Visit: Payer: Medicare Other

## 2022-04-06 ENCOUNTER — Other Ambulatory Visit: Payer: Self-pay | Admitting: Obstetrics and Gynecology

## 2022-04-06 DIAGNOSIS — A6000 Herpesviral infection of urogenital system, unspecified: Secondary | ICD-10-CM

## 2022-04-06 NOTE — Telephone Encounter (Signed)
Med refill request: Valtrex 500 mg Tab  Last AEX: 02/11/22 -JJ  Hx HSV 1 & 2  Rx sent on 02/11/22 #90/3RF to [facility]  Rx refused.   Routing to Dr. Rosann Auerbach.   Encounter closed

## 2022-04-14 ENCOUNTER — Other Ambulatory Visit: Payer: Self-pay | Admitting: Family Medicine

## 2022-04-14 DIAGNOSIS — M810 Age-related osteoporosis without current pathological fracture: Secondary | ICD-10-CM

## 2022-07-01 ENCOUNTER — Ambulatory Visit
Admission: RE | Admit: 2022-07-01 | Discharge: 2022-07-01 | Disposition: A | Discharge status: Home / Self Care | Payer: Medicare Other | Source: Ambulatory Visit | Attending: Family Medicine | Admitting: Family Medicine

## 2022-07-01 DIAGNOSIS — M810 Age-related osteoporosis without current pathological fracture: Secondary | ICD-10-CM

## 2022-09-30 ENCOUNTER — Other Ambulatory Visit: Payer: Medicare Other

## 2022-10-28 ENCOUNTER — Ambulatory Visit
Admission: RE | Admit: 2022-10-28 | Discharge: 2022-10-28 | Disposition: A | Discharge status: Home / Self Care | Payer: Medicare Other | Source: Ambulatory Visit | Attending: Family Medicine | Admitting: Family Medicine

## 2022-10-28 ENCOUNTER — Other Ambulatory Visit: Payer: Self-pay | Admitting: Family Medicine

## 2022-10-28 DIAGNOSIS — R10A0 Flank pain, unspecified side: Secondary | ICD-10-CM

## 2022-10-28 DIAGNOSIS — R109 Unspecified abdominal pain: Secondary | ICD-10-CM

## 2022-10-30 IMAGING — DX DG CHEST 2V
2 series · 2 of 2 positions shown · non-contrast
Comparison: Chest radiograph dated 09/16/2016.

CLINICAL DATA: Follow-up bronchiectasis.

EXAM:
CHEST - 2 VIEW

[chest pa]
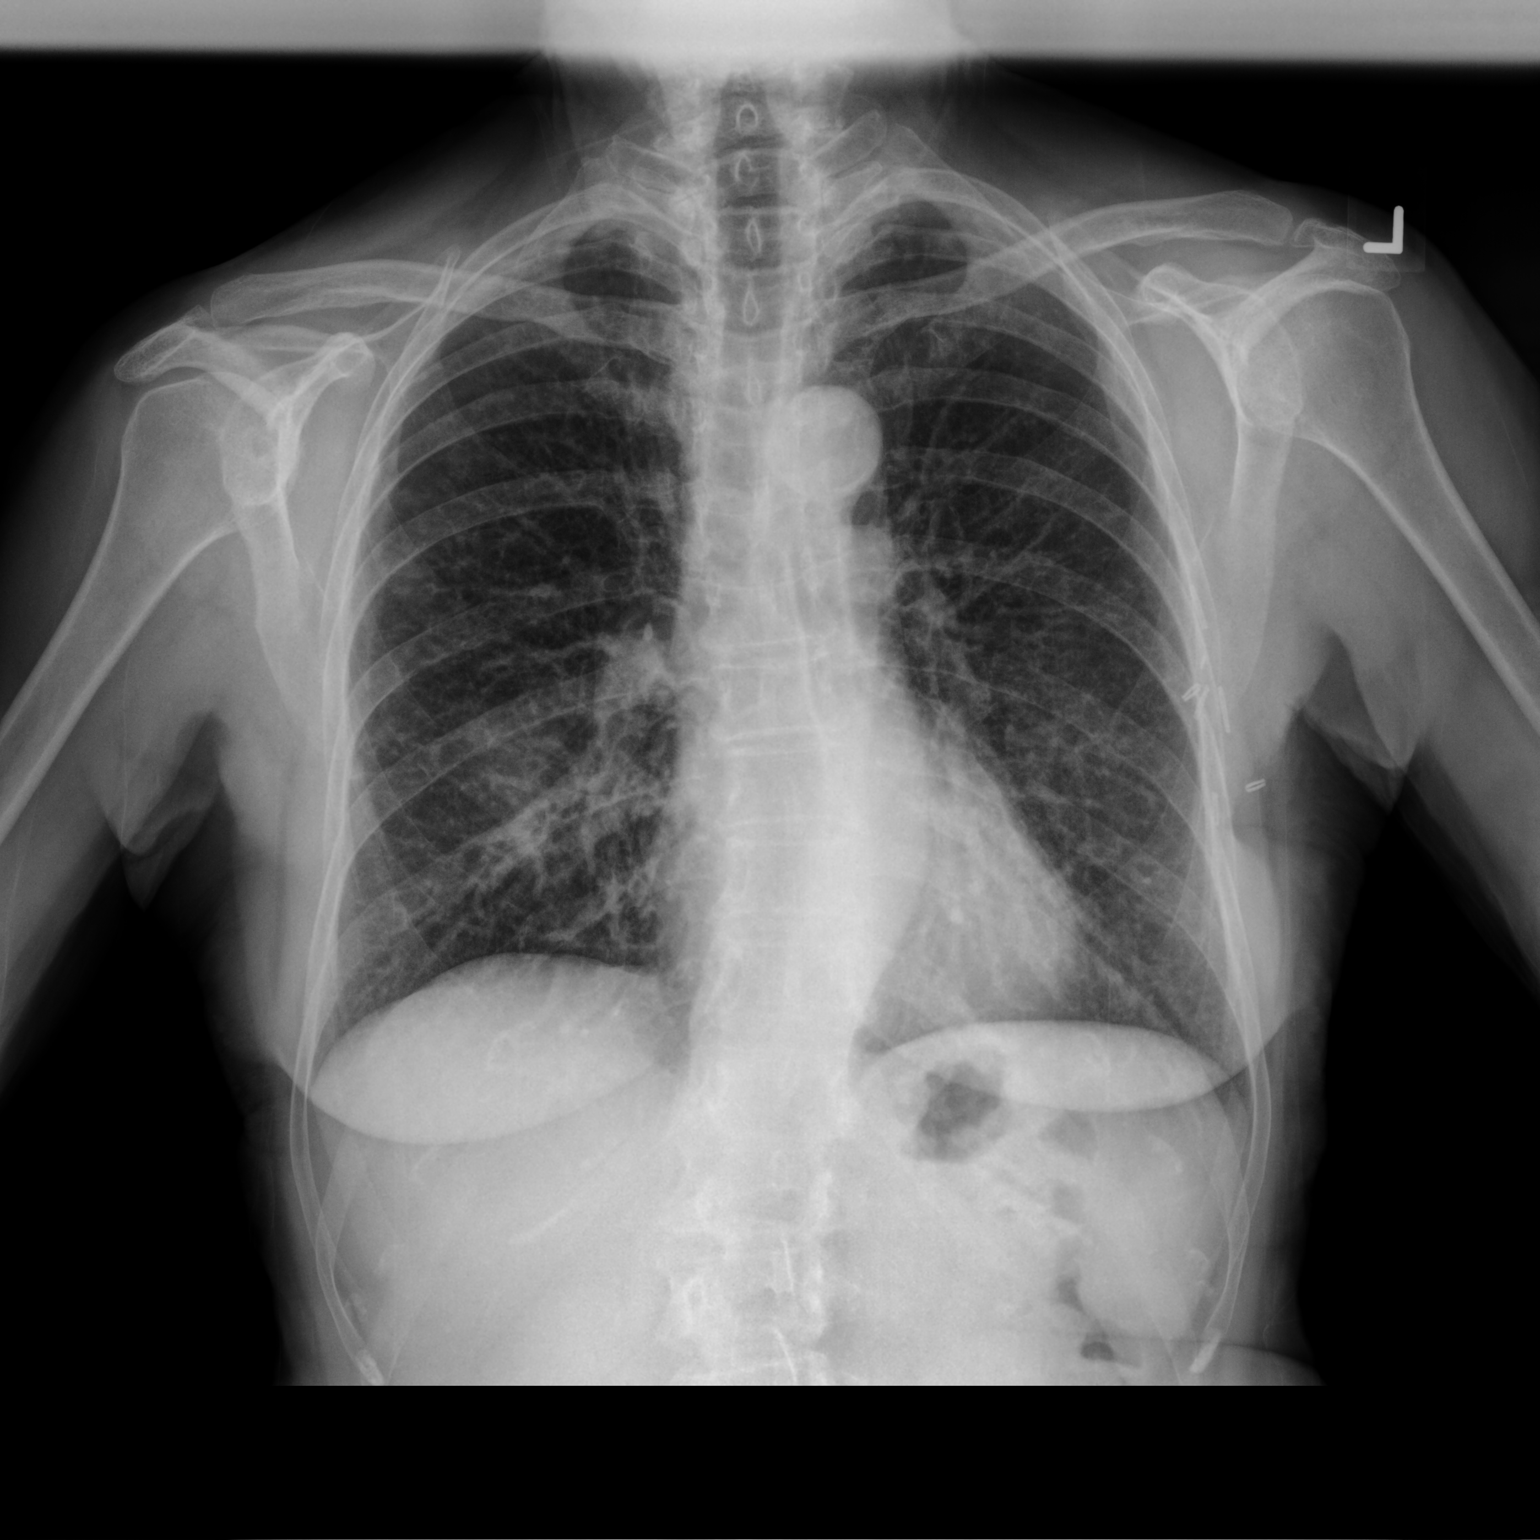

[chest lat]
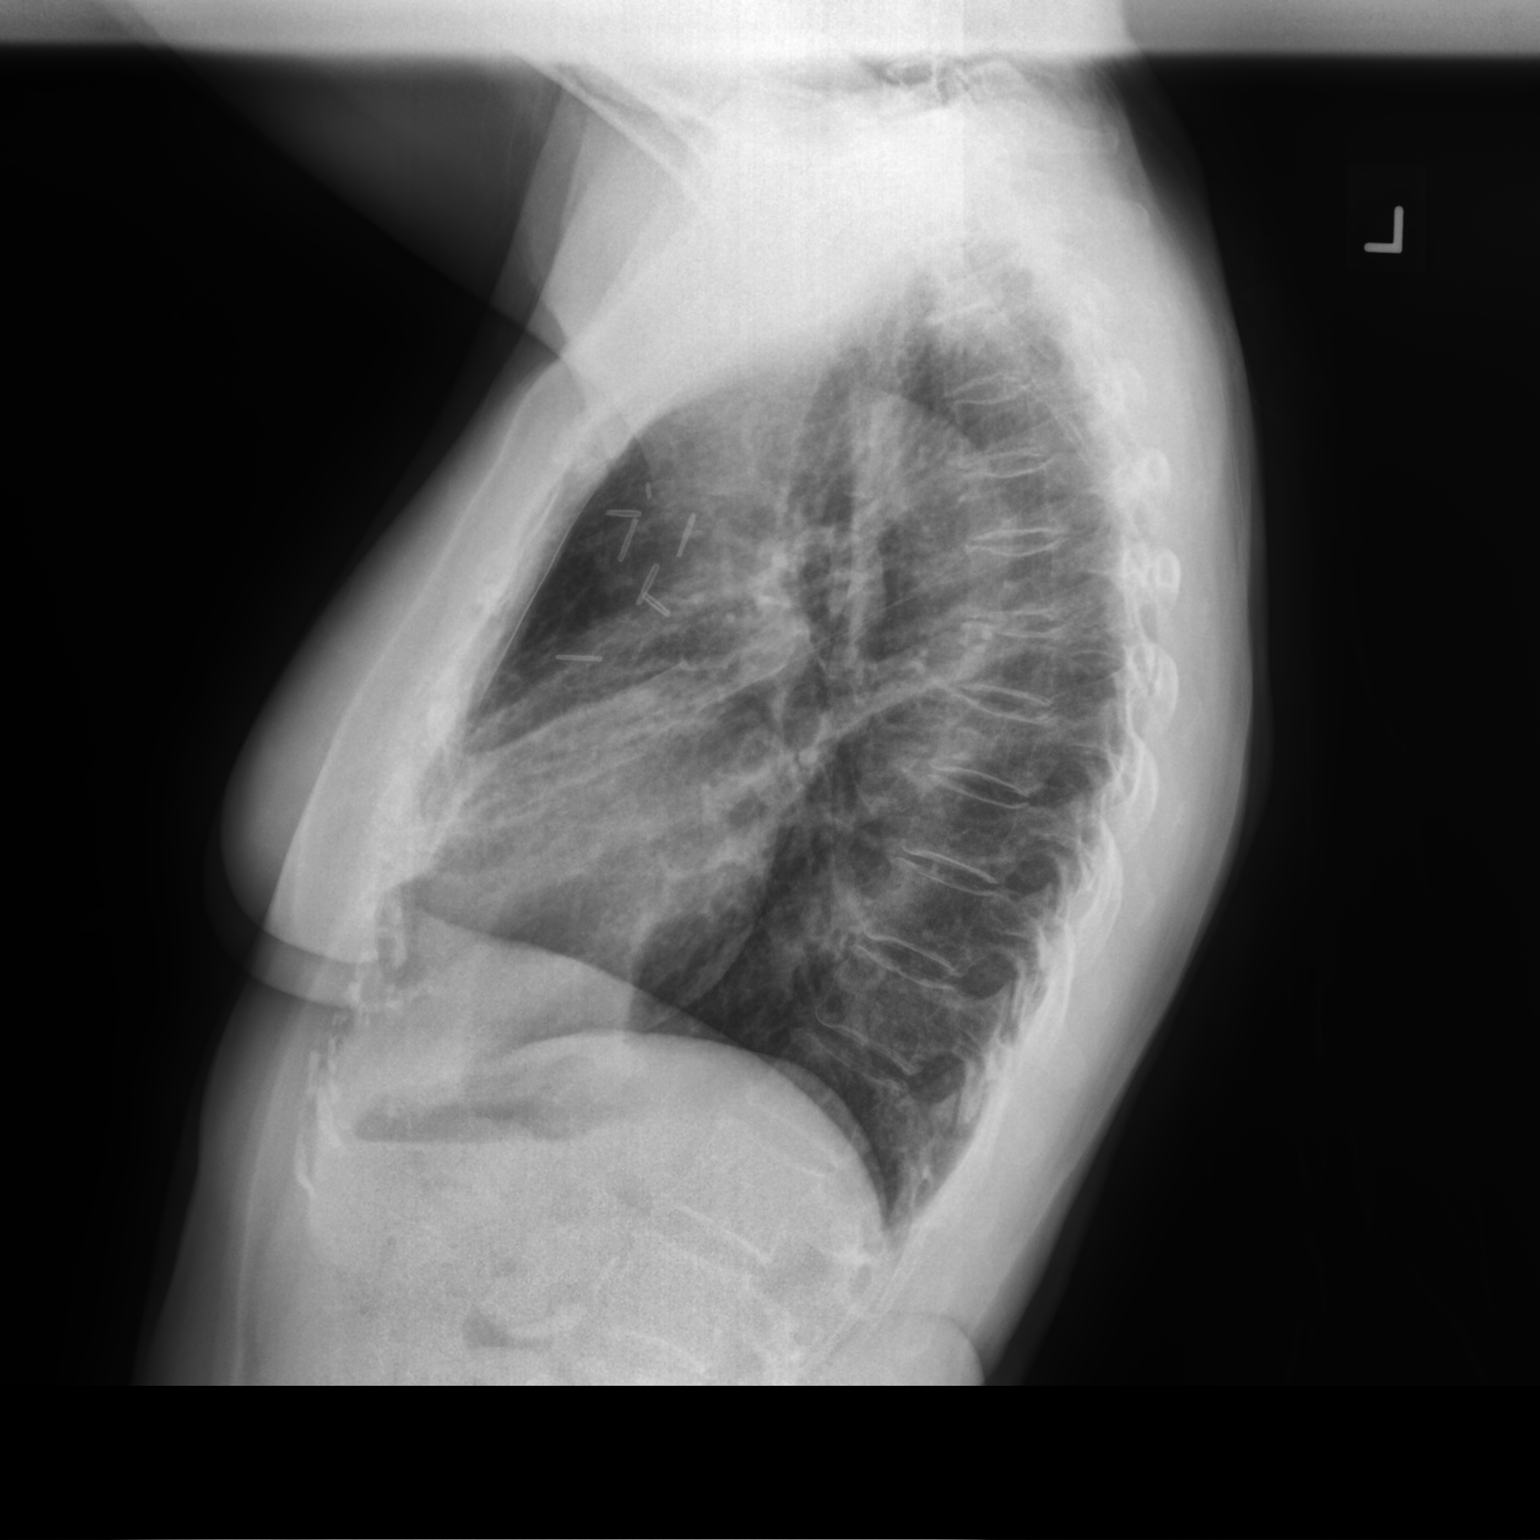

[2 of 2 positions shown; findings below may reference images not displayed]

FINDINGS: There is diffuse chronic interstitial coarsening and bronchiectasis.
An area of increased density in the right middle lobe/perihilar
region is new since the prior radiograph which may represent
progression of bronchiectasis and architectural distortion/scarring
and nodularity. Pneumonia is not excluded clinical correlation is
recommended. No pleural effusion pneumothorax. The cardiac
silhouette is within limits. No acute osseous pathology.
IMPRESSION: Right middle lobe/perihilar increased density, new since the prior
radiograph, likely representing progression of bronchiectasis and
architectural distortion/scarring and nodularity. Pneumonia is not
excluded.

## 2022-10-31 ENCOUNTER — Other Ambulatory Visit: Payer: Self-pay | Admitting: Family Medicine

## 2022-10-31 DIAGNOSIS — R93429 Abnormal radiologic findings on diagnostic imaging of unspecified kidney: Secondary | ICD-10-CM

## 2022-11-15 ENCOUNTER — Ambulatory Visit
Admission: RE | Admit: 2022-11-15 | Discharge: 2022-11-15 | Disposition: A | Discharge status: Home / Self Care | Payer: Medicare Other | Source: Ambulatory Visit | Attending: Family Medicine | Admitting: Family Medicine

## 2022-11-15 DIAGNOSIS — R93429 Abnormal radiologic findings on diagnostic imaging of unspecified kidney: Secondary | ICD-10-CM

## 2023-01-05 ENCOUNTER — Other Ambulatory Visit: Payer: Self-pay | Admitting: Family Medicine

## 2023-01-05 DIAGNOSIS — Z1231 Encounter for screening mammogram for malignant neoplasm of breast: Secondary | ICD-10-CM

## 2023-01-31 NOTE — Progress Notes (Signed)
77 y.o. G24P2001 Divorced Caucasian female here for breast and pelvic exam.   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 She does not need refill.  She has 3 bottles at home that she received from [facility] pharmacy. She tends to have diarrhea and this brings on the outbreaks.  She saw her PCP 2 weeks ago but did not discuss this with her.   Hx right nipple inversion and had removal of nipple in 2018.   Has bladder prolapse and has good control.  She just pushes it back up if needed.   Not sexually active.   Had an x-ray for right hip pain through her PCP.   PCP:   Dr. Cliffton Asters  Patient's last menstrual period was 05/16/2000 (approximate).           Sexually active: No.  The current method of family planning is post menopausal status.    Exercising: Yes.     Walking, stairs Smoker:  no  Health Maintenance: Pap:  03/02/18 neg, 05/29/14 neg History of abnormal Pap:  no MMG:  scheduled 02/20/23, 02/18/22 Breast density Cat B, BI-RADS CAT 1 neg Colonoscopy:  04/08/13 - does at [place] every 5 years.  Did Cologuard directly through [facility] and it was negative.  BMD:   07/01/22  Result  osteoporosis.  PCP following.  TDaP:  2022 Gardasil:   no HIV: n/a Hep C: 08/19/15 neg Screening Labs:  PCP   reports that she has never smoked. She has never used smokeless tobacco. She reports that she does not drink alcohol and does not use drugs.  Past Medical History:  Diagnosis Date   Anemia    Aortic atherosclerosis (HCC) 12/23/2016   Noted on CT   Arthritis    BRCA gene mutation negative 03/2016   Breast cancer (HCC) 2017   Left Breast Cancer   Breast cancer in female [facility]) 12/2015   left breast cancer, Estrogen negative, progesterone slight positive   Bronchiectasis (HCC)    Complication of anesthesia    Dry eye syndrome    Dyslipidemia    Dyspnea    Early cataract    Endometrial polyp    Family history of breast cancer    Family history of  ovarian cancer    Family history of prostate cancer    Gall stones    GERD (gastroesophageal reflux disease)    Hematuria 03/09/2018   History of bronchitis    History of hiatal hernia    History of kidney stones 03/09/2018   HSV infection    pos I and II   Hypothyroidism    Internal and external hemorrhoids without complication 04/08/2013   noted on colonoscopy    Lymphedema    Left breast   Macular degeneration of both eyes    Osteopenia    Osteoporosis 1998   Off Fosomax 2008, Prolia 03/17/11, 09/14/11, 03/16/12   Persistent dry cough    Personal history of radiation therapy 2017   Left Breast Cancer   PMB (postmenopausal bleeding)    PONV (postoperative nausea and vomiting)    Pulmonary nodule 11/13/2015   10 to 11 mm subpleural pulmonary nodules in the right middle and right upper lobes    Shoulder impingement, right 11/22/2016   Vitamin D deficiency    Wears glasses     Past Surgical History:  Procedure Laterality Date   AUGMENTATION MAMMAPLASTY  1981   BREAST IMPLANT REMOVAL  1982  BREAST LUMPECTOMY Left 02/11/2016   BREAST LUMPECTOMY WITH RADIOACTIVE SEED AND SENTINEL LYMPH NODE BIOPSY Left 02/11/2016   Procedure: LEFT BREAST LUMPECTOMY WITH RADIOACTIVE SEED AND LEFT SENTINEL LYMPH NODE BIOPSY;  Surgeon: Almond Lint, MD;  Location: [facility] OR;  Service: General;  Laterality: Left;   COLONOSCOPY  10/04   Dr Ewing Schlein   COLONOSCOPY  04/07/08   normal - Dr. Ewing Schlein   COLONOSCOPY  04/08/2013   DILATATION & CURETTAGE/HYSTEROSCOPY WITH MYOSURE N/A 03/19/2018   Procedure: DILATATION & CURETTAGE/HYSTEROSCOPY;  Surgeon: Romualdo Bolk, MD;  Location: [facility];  Service: Gynecology;  Laterality: N/A;   ELBOW SURGERY Right 12/1997   ESOPHAGOGASTRODUODENOSCOPY ENDOSCOPY  08/2012   [place] hernia with GERD   EXCISION OF BREAST BIOPSY Right 10/19/2017   Procedure: EXCISIONAL BIOPSY OF RIGHT BREAST;  Surgeon: Almond Lint, MD;  Location: [facility];   Service: General;  Laterality: Right;   FOOT NEUROMA SURGERY Left 2004?   TONSILLECTOMY AND ADENOIDECTOMY     TUBAL LIGATION  1981    Current Outpatient Medications  Medication Sig Dispense Refill   CALCIUM PO Take by mouth.     Cholecalciferol (VITAMIN D3) 5000 units CAPS Take 1 capsule by mouth daily.     denosumab (PROLIA) 60 MG/ML SOLN injection Inject 60 mg into the skin every 6 (six) months. Administer in upper arm, thigh, or abdomen     famotidine (PEPCID) 20 MG tablet Take 20 mg by mouth 2 (two) times daily.     Ferrous Sulfate (IRON) 28 MG TABS Take 28 mg by mouth daily. Mon, Wed, and Fridays only     Multiple Vitamin (MULTI-VITAMIN PO) Take by mouth 2 (two) times daily as needed. Women's 50 +     Propylene Glycol (SYSTANE BALANCE OP) Apply 1 tablet to eye daily.     RESTASIS 0.05 % ophthalmic emulsion Place 1 drop into both eyes 2 (two) times daily.      rosuvastatin (CRESTOR) 10 MG tablet Take 10 mg by mouth daily.     SYNTHROID 75 MCG tablet Take 75 mcg by mouth daily.     valACYclovir (VALTREX) 500 MG tablet TAKE 1 TABLET DAILY, CAN   INCREASE TO 1 TABLET TWO   TIMES A DAY FOR 3 DAYS IF  YOU HAVE AN OUTBREAK 90 tablet 3   No current facility-administered medications for this visit.    Family History  Problem Relation Age of Onset   Heart failure Mother    Prostate cancer Father 26   Liver cancer Father    Colon polyps Sister    Breast cancer Sister 22   Breast cancer Sister 42   Breast cancer Sister 78       recurrance at 61   Breast cancer Sister 42   Colon polyps Sister    Lung cancer Brother        smoked   Prostate cancer Brother 47   Melanoma Brother    Breast cancer Paternal Aunt    Ovarian cancer Cousin        died at 71   Breast cancer Other        X 4 + age 48, 77 with recurrence, 31, 50   Colon cancer Other 50    Review of Systems  All other systems reviewed and are negative.   Exam:   BP 122/60 (BP Location: Right Arm, Patient Position:  Sitting, Cuff Size: Normal)   Pulse 92   Ht 5\' 2"  (1.575 m)  Wt 121 lb (54.9 kg)   LMP 05/16/2000 (Approximate)   SpO2 97%   BMI 22.13 kg/m     General appearance: alert, cooperative and appears stated age Head: normocephalic, without obvious abnormality, atraumatic Neck: no adenopathy, supple, symmetrical, trachea midline and thyroid normal to inspection and palpation Lungs: clear to auscultation bilaterally Breasts:  Right - right nipple absent, no masses or tenderness, No nipple retraction or dimpling, No nipple discharge or bleeding, No axillary adenopathy. Left - scar of left lateral breast near axilla. No masses or tenderness, No nipple retraction or dimpling, No nipple discharge or bleeding, left axilla with 2 palpable nodes:  one 4 mm and firm, one 1 cm and not firm.  Heart: regular rate and rhythm Abdomen: soft, non-tender; no masses, no organomegaly Extremities: extremities normal, atraumatic, no cyanosis or edema Skin: skin color, texture, turgor normal. No rashes or lesions Lymph nodes: cervical, supraclavicular, and axillary nodes normal. Neurologic: grossly normal  Pelvic: External genitalia:  no lesions              No abnormal inguinal nodes palpated.              Urethra:  normal appearing urethra with no masses, tenderness or lesions              Bartholins and Skenes: normal                 Vagina: normal appearing vagina with normal color and discharge, no lesions.  First degree cystocele and uterine prolapse.               Cervix: no lesions              Pap taken: no Bimanual Exam:  Uterus:  normal size, contour, position, consistency, mobility, non-tender              Adnexa: no mass, fullness, tenderness              Rectal exam: yes.  Confirms.              Anus:  normal sphincter tone, no lesions  Chaperone was present for exam:  Warren Lacy, CMA  Assessment:   Well woman visit with gynecologic exam. Hx HSV I and II. Takes Valtrex.  Hx breast implant  removal.  Hx DCIS left breast.  Status post lumpectomy, XRT, Tamoxifen for 3 years.  Status post removal of right nipple due to inversion of nipple.  Pathology:  periductal chronic inflammation.  FH breast cancer. Negative BRCA.  Left axillary lymph nodes palpable today. Incomplete uterovaginal prolapse.  Pessary made incontinence worse.  No current signs of progression.  Osteopenia.  On Prolia.  PCP following.   Plan: Will proceed with bilateral dx mammogram and left breast ultrasound at [facility]. Self breast awareness reviewed. Pap and HR HPV not needed.  Ok to stop paps.  Guidelines for Calcium, Vitamin D, regular exercise program including cardiovascular and weight bearing exercise. She does not currently need refill of Valtrex.  She will let me know if she needs refills.  Follow up annually and prn.   25 min  total time was spent for this patient encounter, including preparation, face-to-face counseling with the patient, coordination of care, and documentation of the encounter in addition to doing screening breast and pelvic exam.

## 2023-02-14 ENCOUNTER — Ambulatory Visit (INDEPENDENT_AMBULATORY_CARE_PROVIDER_SITE_OTHER): Payer: Medicare Other | Admitting: Obstetrics and Gynecology

## 2023-02-14 ENCOUNTER — Encounter: Payer: Self-pay | Admitting: Obstetrics and Gynecology

## 2023-02-14 ENCOUNTER — Ambulatory Visit: Payer: Medicare Other | Admitting: Obstetrics and Gynecology

## 2023-02-14 ENCOUNTER — Telehealth: Payer: Self-pay | Admitting: Obstetrics and Gynecology

## 2023-02-14 VITALS — BP 122/60 | HR 92 | Ht 62.0 in | Wt 121.0 lb

## 2023-02-14 DIAGNOSIS — R590 Localized enlarged lymph nodes: Secondary | ICD-10-CM | POA: Diagnosis not present

## 2023-02-14 DIAGNOSIS — A6000 Herpesviral infection of urogenital system, unspecified: Secondary | ICD-10-CM | POA: Diagnosis not present

## 2023-02-14 DIAGNOSIS — Z9189 Other specified personal risk factors, not elsewhere classified: Secondary | ICD-10-CM | POA: Diagnosis not present

## 2023-02-14 DIAGNOSIS — Z853 Personal history of malignant neoplasm of breast: Secondary | ICD-10-CM

## 2023-02-14 NOTE — Patient Instructions (Signed)

## 2023-02-14 NOTE — Telephone Encounter (Signed)
Per review of EPIC, patient is scheduled for 03/14/23.   Encounter closed.

## 2023-02-14 NOTE — Telephone Encounter (Signed)
Orders placed.   Pt notified that orders were in and I advised her to call and schedule appt for the dx breast imaging at her earliest convenience. Instructed her on which lines to press to get to the correct appt desk to schedule.   Pt voiced understanding and appreciation for call.

## 2023-02-14 NOTE — Telephone Encounter (Signed)
Please schedule [facility] bilateral [facility] (She has an appointment for a screening mammogram on 02/20/23.)  My patient has a hx left breast cancer and she has 2 palpable left axillary lymph nodes palpable today at time of her routine visit.   Please coordinate this appointment with her.  She states she has other appointments to work around.

## 2023-02-16 ENCOUNTER — Ambulatory Visit: Payer: Medicare Other | Admitting: Obstetrics and Gynecology

## 2023-02-20 ENCOUNTER — Ambulatory Visit: Payer: Medicare Other

## 2023-02-24 ENCOUNTER — Ambulatory Visit
Admission: RE | Admit: 2023-02-24 | Discharge: 2023-02-24 | Disposition: A | Discharge status: Home / Self Care | Payer: Medicare Other | Source: Ambulatory Visit | Attending: Obstetrics and Gynecology | Admitting: Obstetrics and Gynecology

## 2023-02-24 DIAGNOSIS — R590 Localized enlarged lymph nodes: Secondary | ICD-10-CM

## 2023-02-24 DIAGNOSIS — Z853 Personal history of malignant neoplasm of breast: Secondary | ICD-10-CM

## 2023-03-14 ENCOUNTER — Other Ambulatory Visit: Payer: Medicare Other

## 2023-05-16 NOTE — Progress Notes (Signed)
 GYNECOLOGY  VISIT   HPI: 77 y.o.   Divorced  Caucasian female   [id number] with Patient's last menstrual period was 05/16/2000 (approximate).   here for: breast check.     At her routine exam 02/14/23, she had the following: Right - right nipple absent, no masses or tenderness, No nipple retraction or dimpling, No nipple discharge or bleeding, No axillary adenopathy. Left - scar of left lateral breast near axilla. No masses or tenderness, No nipple retraction or dimpling, No nipple discharge or bleeding, left axilla with 2 palpable nodes:  one 4 mm and firm, one 1 cm and not firm.   She had dx bilateral breast imaging and it found surgical changes in the axilla and no mass, enlarged lymph node, or sign of cancer.  Hx ductal CIS of left breast dx in 2018.  She had a lumpectomy and XRT.   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  She has sensitivity of the left breast.   She has also had her right nipple removed due to nipple inversion.  The pathology report was benign.   There is a strong family history of breast cancer.  Patient has had negative genetic testing.   GYNECOLOGIC HISTORY: Patient's last menstrual period was 05/16/2000 (approximate). Contraception:  PMP Menopausal hormone therapy:  n/a Last 2 paps:  03/02/18 neg, 05/29/14 neg History of abnormal Pap or positive HPV:  no Mammogram:  02/24/23 Breast Density cat B, BI-RADS CAT 2 benign        OB History     Gravida  2   Para  2   Term  2   Preterm  0   AB  0   Living  1      SAB  0   IAB  0   Ectopic  0   Multiple  0   Live Births  1              Patient Active Problem List   Diagnosis Date Noted   Adult hypothyroidism 01/01/2018   Avitaminosis D 01/01/2018   Dyslipidemia 01/01/2018   OP (osteoporosis) 01/01/2018   Ductal carcinoma in situ of left breast 01/31/2017   Genetic testing 03/22/2016   Family history of breast cancer    Family history of ovarian  cancer    Family history of prostate cancer    Breast cancer of upper-outer quadrant of left female breast (HCC) 01/20/2016   Bronchiectasis without acute exacerbation (HCC)  with MPNs 12/11/2015    Past Medical History:  Diagnosis Date   Anemia    Aortic atherosclerosis (HCC) 12/23/2016   Noted on CT   Arthritis    BRCA gene mutation negative 03/2016   Breast cancer (HCC) 2017   Left Breast Cancer   Breast cancer in female [facility]) 12/2015   left breast cancer, Estrogen negative, progesterone slight positive   Bronchiectasis (HCC)    Complication of anesthesia    Dry eye syndrome    Dyslipidemia    Dyspnea    Early cataract    Endometrial polyp    Family history of breast cancer    Family history of ovarian cancer    Family history of prostate cancer    Gall stones    GERD (gastroesophageal reflux disease)    Hematuria 03/09/2018   History of bronchitis    History of hiatal hernia    History of kidney stones 03/09/2018   HSV infection    pos I and II  Hypothyroidism    Internal and external hemorrhoids without complication 04/08/2013   noted on colonoscopy    Lymphedema    Left breast   Macular degeneration of both eyes    Osteopenia    Osteoporosis 1998   Off Fosomax 2008, Prolia 03/17/11, 09/14/11, 03/16/12   Persistent dry cough    Personal history of radiation therapy 2017   Left Breast Cancer   PMB (postmenopausal bleeding)    PONV (postoperative nausea and vomiting)    Pulmonary nodule 11/13/2015   10 to 11 mm subpleural pulmonary nodules in the right middle and right upper lobes    Shoulder impingement, right 11/22/2016   Vitamin D  deficiency    Wears glasses     Past Surgical History:  Procedure Laterality Date   AUGMENTATION MAMMAPLASTY  1981   BREAST IMPLANT REMOVAL  1982   BREAST LUMPECTOMY Left 02/11/2016   BREAST LUMPECTOMY WITH RADIOACTIVE SEED AND SENTINEL LYMPH NODE BIOPSY Left 02/11/2016   Procedure: LEFT BREAST LUMPECTOMY WITH RADIOACTIVE SEED  AND LEFT SENTINEL LYMPH NODE BIOPSY;  Surgeon: Jina Nephew, MD;  Location: [facility];  Service: General;  Laterality: Left;   COLONOSCOPY  10/04   Dr Rosalie   COLONOSCOPY  04/07/08   normal - Dr. Rosalie   COLONOSCOPY  04/08/2013   DILATATION & CURETTAGE/HYSTEROSCOPY WITH MYOSURE N/A 03/19/2018   Procedure: DILATATION & CURETTAGE/HYSTEROSCOPY;  Surgeon: Jannis Kate Norris, MD;  Location: [facility];  Service: Gynecology;  Laterality: N/A;   ELBOW SURGERY Right 12/1997   ESOPHAGOGASTRODUODENOSCOPY ENDOSCOPY  08/2012   [place] hernia with GERD   EXCISION OF BREAST BIOPSY Right 10/19/2017   Procedure: EXCISIONAL BIOPSY OF RIGHT BREAST;  Surgeon: Nephew Jina, MD;  Location: [facility];  Service: General;  Laterality: Right;   FOOT NEUROMA SURGERY Left 2004?   TONSILLECTOMY AND ADENOIDECTOMY     TUBAL LIGATION  1981    Current Outpatient Medications  Medication Sig Dispense Refill   CALCIUM  PO Take by mouth.     Cholecalciferol (VITAMIN D3) 5000 units CAPS Take 1 capsule by mouth daily.     denosumab (PROLIA) 60 MG/ML SOLN injection Inject 60 mg into the skin every 6 (six) months. Administer in upper arm, thigh, or abdomen     ezetimibe (ZETIA) 10 MG tablet Take 10 mg by mouth daily.     Ferrous Sulfate (IRON) 28 MG TABS Take 28 mg by mouth daily. Mon, Wed, and Fridays only     levothyroxine (SYNTHROID) 75 MCG tablet 1 tablet Orally Once a day in the morning on an empty stomach for 90 days     Multiple Vitamin (MULTI-VITAMIN PO) Take by mouth 2 (two) times daily as needed. Women's 50 +     Propylene Glycol (SYSTANE BALANCE OP) Apply 1 tablet to eye daily.     RESTASIS 0.05 % ophthalmic emulsion Place 1 drop into both eyes 2 (two) times daily.      valACYclovir  (VALTREX ) 500 MG tablet TAKE 1 TABLET DAILY, CAN   INCREASE TO 1 TABLET TWO   TIMES A DAY FOR 3 DAYS IF  YOU HAVE AN OUTBREAK 90 tablet 3   No current facility-administered medications for this visit.      ALLERGIES: Codeine  Family History  Problem Relation Age of Onset   Heart failure Mother    Prostate cancer Father 28   Liver cancer Father    Colon polyps Sister    Breast cancer Sister 59   Breast cancer  Sister 33   Breast cancer Sister 29       recurrance at 56   Breast cancer Sister 61   Colon polyps Sister    Lung cancer Brother        smoked   Prostate cancer Brother 42   Melanoma Brother    Breast cancer Paternal Aunt    Ovarian cancer Cousin        died at 53   Breast cancer Other        X 4 + age 34, 66 with recurrence, 40, 98   Colon cancer Other 4    Social History   Socioeconomic History   Marital status: Divorced    Spouse name: Not on file   Number of children: 1   Years of education: Not on file   Highest education level: Not on file  Occupational History   Not on file  Tobacco Use   Smoking status: Never   Smokeless tobacco: Never  Vaping Use   Vaping status: Never Used  Substance and Sexual Activity   Alcohol use: No   Drug use: No   Sexual activity: Not Currently    Partners: Male    Birth control/protection: Post-menopausal    Comment: less than 5 sexual partners, over 18 at 1st intercourse, previous hx of HSV, personal hx breast cancer, no DES exposure  Other Topics Concern   Not on file  Social History Narrative   Not on file   Social Drivers of Health   Financial Resource Strain: Not on file  Food Insecurity: Not on file  Transportation Needs: Not on file  Physical Activity: Not on file  Stress: Not on file  Social Connections: Not on file  Intimate Partner Violence: Not on file    Review of Systems  All other systems reviewed and are negative.   PHYSICAL EXAMINATION:   BP 126/72 (BP Location: Right Arm, Patient Position: Sitting, Cuff Size: Small)   Pulse 66   Ht 5' 2 (1.575 m)   Wt 120 lb (54.4 kg)   LMP 05/16/2000 (Approximate)   SpO2 97%   BMI 21.95 kg/m     General appearance: alert, cooperative and appears  stated age  Breasts:  right - absent nipple, no masses or tenderness, No nipple retraction or dimpling, No nipple discharge or bleeding, no axillary adenopathy. Left - breast with no masses or tenderness, No nipple retraction or dimpling, No nipple discharge or bleeding Left axilla with two 4 mm firm palpable areas which appear to attach to the bone.   Chaperone was present for exam:  Damien FALCON, CMA  ASSESSMENT:  Left axillary masses.  Benign dx mammogram and axillary ultrasound.  Hx left breast DCIS.  Negative genetic testing.   PLAN:  We reviewed her breast imaging reports.  We discussed a follow up plan of referral to oncology, breast MRI, and a recheck at her breast and pelvic exam in October.  The patient declines further evaluation at this time and will recheck her breasts and axillary area regularly.  She will contact me if she notes a change in the size of the lumps, which she is able to identify today.   35 min  total time was spent for this patient encounter, including preparation, face-to-face counseling with the patient, coordination of care, and documentation of the encounter.

## 2023-05-24 ENCOUNTER — Ambulatory Visit: Payer: Medicare Other | Admitting: Obstetrics and Gynecology

## 2023-05-24 ENCOUNTER — Encounter: Payer: Self-pay | Admitting: Obstetrics and Gynecology

## 2023-05-24 VITALS — BP 126/72 | HR 66 | Ht 62.0 in | Wt 120.0 lb

## 2023-05-24 DIAGNOSIS — R2232 Localized swelling, mass and lump, left upper limb: Secondary | ICD-10-CM

## 2023-07-07 ENCOUNTER — Encounter: Payer: Self-pay | Admitting: Internal Medicine

## 2023-08-24 ENCOUNTER — Telehealth: Payer: Self-pay | Admitting: *Deleted

## 2023-08-24 NOTE — Telephone Encounter (Signed)
 Patient left message at 1506 requesting return call to proceed with MRI previously recommended by Dr. Edward Jolly.   Patient was seen in the office on 05/24/23.   Left message to call [facility] Triage at [phone], option 4.

## 2023-08-25 NOTE — Telephone Encounter (Signed)
 Pt reports left breast with excessive tenderness, especially towards axillary area and is now wanting to move forward with Dr. Rica Records recommendations regarding breast MRI.   OK to send order now or any new recommendations/advice given time btwn now and previous appt/recommendations then? TIA

## 2023-08-25 NOTE — Telephone Encounter (Signed)
 Pt LVM in triage line stating she is RTC from [facility] due to recent inquiry about her lymph nodes. Requesting cb.

## 2023-08-28 NOTE — Telephone Encounter (Signed)
 How about a 4:30 pm appointment time with me?

## 2023-08-28 NOTE — Telephone Encounter (Signed)
 Pt LVM in triage line requesting f/u.

## 2023-08-28 NOTE — Telephone Encounter (Signed)
 Spoke with patient scheduled for OV on 4/15 at 1600 with Dr. Colvin Dec.   Routing to provider for final review. Patient is agreeable to disposition. Will close encounter.

## 2023-08-28 NOTE — Telephone Encounter (Signed)
 Please schedule office visit with me so I can provide best care.

## 2023-08-29 ENCOUNTER — Encounter: Payer: Self-pay | Admitting: Obstetrics and Gynecology

## 2023-08-29 ENCOUNTER — Telehealth: Payer: Self-pay | Admitting: Obstetrics and Gynecology

## 2023-08-29 ENCOUNTER — Ambulatory Visit (INDEPENDENT_AMBULATORY_CARE_PROVIDER_SITE_OTHER): Admitting: Obstetrics and Gynecology

## 2023-08-29 VITALS — BP 134/70 | HR 67 | Ht 62.0 in | Wt 120.0 lb

## 2023-08-29 DIAGNOSIS — R2232 Localized swelling, mass and lump, left upper limb: Secondary | ICD-10-CM

## 2023-08-29 DIAGNOSIS — Z86000 Personal history of in-situ neoplasm of breast: Secondary | ICD-10-CM

## 2023-08-29 DIAGNOSIS — N6325 Unspecified lump in the left breast, overlapping quadrants: Secondary | ICD-10-CM

## 2023-08-29 DIAGNOSIS — N644 Mastodynia: Secondary | ICD-10-CM | POA: Diagnosis not present

## 2023-08-29 NOTE — Progress Notes (Unsigned)
 GYNECOLOGY  VISIT   HPI: 78 y.o.   Divorced  Caucasian female   [id number] with Patient's last menstrual period was 05/16/2000 (approximate).   here for: L breast tenderness. Pt states the pain has worsened and felt like it was a pulled muscle in the same area.  States left lateral breast soreness for 3 weeks.  Wakes up and it is sore.  Hx left breast cancer.   GYNECOLOGIC HISTORY: Patient's last menstrual period was 05/16/2000 (approximate). Contraception:  PMP Menopausal hormone therapy:  n/a Last 2 paps:  03/02/18 neg, 05/29/14 History of abnormal Pap or positive HPV:  no Mammogram:  02/24/23 Breast Density Cat B, BI-RADS CAT 2 benign        OB History     Gravida  2   Para  2   Term  2   Preterm  0   AB  0   Living  1      SAB  0   IAB  0   Ectopic  0   Multiple  0   Live Births  1              Patient Active Problem List   Diagnosis Date Noted   Adult hypothyroidism 01/01/2018   Avitaminosis D 01/01/2018   Dyslipidemia 01/01/2018   OP (osteoporosis) 01/01/2018   Ductal carcinoma in situ of left breast 01/31/2017   Genetic testing 03/22/2016   Family history of breast cancer    Family history of ovarian cancer    Family history of prostate cancer    Breast cancer of upper-outer quadrant of left female breast (HCC) 01/20/2016   Bronchiectasis without acute exacerbation (HCC)  with MPNs 12/11/2015    Past Medical History:  Diagnosis Date   Anemia    Aortic atherosclerosis (HCC) 12/23/2016   Noted on CT   Arthritis    BRCA gene mutation negative 03/2016   Breast cancer (HCC) 2017   Left Breast Cancer   Breast cancer in female [facility]) 12/2015   left breast cancer, Estrogen negative, progesterone slight positive   Bronchiectasis (HCC)    Complication of anesthesia    Dry eye syndrome    Dyslipidemia    Dyspnea    Early cataract    Endometrial polyp    Family history of breast cancer    Family history of ovarian cancer    Family  history of prostate cancer    Gall stones    GERD (gastroesophageal reflux disease)    Hematuria 03/09/2018   History of bronchitis    History of hiatal hernia    History of kidney stones 03/09/2018   HSV infection    pos I and II   Hypothyroidism    Internal and external hemorrhoids without complication 04/08/2013   noted on colonoscopy    Lymphedema    Left breast   Macular degeneration of both eyes    Osteopenia    Osteoporosis 1998   Off Fosomax 2008, Prolia 03/17/11, 09/14/11, 03/16/12   Persistent dry cough    Personal history of radiation therapy 2017   Left Breast Cancer   PMB (postmenopausal bleeding)    PONV (postoperative nausea and vomiting)    Pulmonary nodule 11/13/2015   10 to 11 mm subpleural pulmonary nodules in the right middle and right upper lobes    Shoulder impingement, right 11/22/2016   Vitamin D deficiency    Wears glasses     Past Surgical History:  Procedure Laterality Date  AUGMENTATION MAMMAPLASTY  1981   BREAST IMPLANT REMOVAL  1982   BREAST LUMPECTOMY Left 02/11/2016   BREAST LUMPECTOMY WITH RADIOACTIVE SEED AND SENTINEL LYMPH NODE BIOPSY Left 02/11/2016   Procedure: LEFT BREAST LUMPECTOMY WITH RADIOACTIVE SEED AND LEFT SENTINEL LYMPH NODE BIOPSY;  Surgeon: Almond Lint, MD;  Location: [facility] OR;  Service: General;  Laterality: Left;   COLONOSCOPY  10/04   Dr Ewing Schlein   COLONOSCOPY  04/07/08   normal - Dr. Ewing Schlein   COLONOSCOPY  04/08/2013   DILATATION & CURETTAGE/HYSTEROSCOPY WITH MYOSURE N/A 03/19/2018   Procedure: DILATATION & CURETTAGE/HYSTEROSCOPY;  Surgeon: Romualdo Bolk, MD;  Location: [facility];  Service: Gynecology;  Laterality: N/A;   ELBOW SURGERY Right 12/1997   ESOPHAGOGASTRODUODENOSCOPY ENDOSCOPY  08/2012   [place] hernia with GERD   EXCISION OF BREAST BIOPSY Right 10/19/2017   Procedure: EXCISIONAL BIOPSY OF RIGHT BREAST;  Surgeon: Almond Lint, MD;  Location: [facility];  Service: General;   Laterality: Right;   FOOT NEUROMA SURGERY Left 2004?   TONSILLECTOMY AND ADENOIDECTOMY     TUBAL LIGATION  1981    Current Outpatient Medications  Medication Sig Dispense Refill   CALCIUM PO Take by mouth.     Cholecalciferol (VITAMIN D3) 5000 units CAPS Take 1 capsule by mouth daily.     denosumab (PROLIA) 60 MG/ML SOLN injection Inject 60 mg into the skin every 6 (six) months. Administer in upper arm, thigh, or abdomen     ezetimibe (ZETIA) 10 MG tablet Take 10 mg by mouth daily.     Ferrous Sulfate (IRON) 28 MG TABS Take 28 mg by mouth daily. Mon, Wed, and Fridays only     levothyroxine (SYNTHROID) 75 MCG tablet 1 tablet Orally Once a day in the morning on an empty stomach for 90 days     Multiple Vitamin (MULTI-VITAMIN PO) Take by mouth 2 (two) times daily as needed. Women's 50 +     Propylene Glycol (SYSTANE BALANCE OP) Apply 1 tablet to eye daily.     RESTASIS 0.05 % ophthalmic emulsion Place 1 drop into both eyes 2 (two) times daily.      valACYclovir (VALTREX) 500 MG tablet TAKE 1 TABLET DAILY, CAN   INCREASE TO 1 TABLET TWO   TIMES A DAY FOR 3 DAYS IF  YOU HAVE AN OUTBREAK 90 tablet 3   No current facility-administered medications for this visit.     ALLERGIES: Codeine  Family History  Problem Relation Age of Onset   Heart failure Mother    Prostate cancer Father 18   Liver cancer Father    Colon polyps Sister    Breast cancer Sister 49   Breast cancer Sister 42   Breast cancer Sister 12       recurrance at 20   Breast cancer Sister 68   Colon polyps Sister    Lung cancer Brother        smoked   Prostate cancer Brother 47   Melanoma Brother    Breast cancer Paternal Aunt    Ovarian cancer Cousin        died at 35   Breast cancer Other        X 4 + age 23, 58 with recurrence, 72, 76   Colon cancer Other 53    Social History   Socioeconomic History   Marital status: Divorced    Spouse name: Not on file   Number of children: 1   Years of education:  Not on  file   Highest education level: Not on file  Occupational History   Not on file  Tobacco Use   Smoking status: Never   Smokeless tobacco: Never  Vaping Use   Vaping status: Never Used  Substance and Sexual Activity   Alcohol use: No   Drug use: No   Sexual activity: Not Currently    Partners: Male    Birth control/protection: Post-menopausal    Comment: less than 5 sexual partners, over 18 at 1st intercourse, previous hx of HSV, personal hx breast cancer, no DES exposure  Other Topics Concern   Not on file  Social History Narrative   Not on file   Social Drivers of Health   Financial Resource Strain: Not on file  Food Insecurity: Not on file  Transportation Needs: Not on file  Physical Activity: Not on file  Stress: Not on file  Social Connections: Not on file  Intimate Partner Violence: Not on file    Review of Systems  All other systems reviewed and are negative.   PHYSICAL EXAMINATION:   BP 134/70 (BP Location: Left Arm, Patient Position: Sitting, Cuff Size: Small)   Pulse 67   Ht 5\' 2"  (1.575 m)   Wt 120 lb (54.4 kg)   LMP 05/16/2000 (Approximate)   SpO2 95%   BMI 21.95 kg/m     General appearance: alert, cooperative and appears stated age Lungs: clear to auscultation bilaterally. Heart:  regular rate and rhythm. Breasts:  right - absent nipple, no masses or tenderness, No nipple retraction or dimpling, No nipple discharge or bleeding, no axillary adenopathy. Left - breast with no masses or tenderness, No nipple retraction or dimpling, No nipple discharge or bleeding Left axilla with two 4 - 5 mm area palpable along the left lateral breast, tender. 1 cm palpable node.   Chaperone was present for exam:  Emmaline Haring, CMA  ASSESSMENT:  Hx left breast DCIS.  Left lateral breast mass.  Left axillary lymph adenopathy. Left breast pain.   PLAN:  Tylenol, ibuprofen, heat, or ice to treat left breast pain.  I recommend dx left mammogram and left breast US .    Will await results from radiology for potential next step in care.  If imaging is normal, I recommend consultation with patient's breast surgeon.  Patient indicates understanding of this plan.   28 min  total time was spent for this patient encounter, including preparation, face-to-face counseling with the patient, coordination of care, and documentation of the encounter.

## 2023-08-29 NOTE — Telephone Encounter (Signed)
 Please schedule [facility] left mammogram and [facility]   Patient has left lateral breast pain and hx of left breast cancer.   I feel a 4 - 5 mm lump in the left lateral breast.  She also has a 1 cm left axillary node.

## 2023-08-30 ENCOUNTER — Ambulatory Visit
Admission: RE | Admit: 2023-08-30 | Discharge: 2023-08-30 | Disposition: A | Discharge status: Home / Self Care | Source: Ambulatory Visit | Attending: Obstetrics and Gynecology | Admitting: Obstetrics and Gynecology

## 2023-08-30 ENCOUNTER — Other Ambulatory Visit: Payer: Self-pay | Admitting: Obstetrics and Gynecology

## 2023-08-30 ENCOUNTER — Other Ambulatory Visit: Payer: Self-pay

## 2023-08-30 ENCOUNTER — Encounter: Payer: Self-pay | Admitting: Obstetrics and Gynecology

## 2023-08-30 DIAGNOSIS — Z853 Personal history of malignant neoplasm of breast: Secondary | ICD-10-CM

## 2023-08-30 DIAGNOSIS — R590 Localized enlarged lymph nodes: Secondary | ICD-10-CM

## 2023-08-30 DIAGNOSIS — R2232 Localized swelling, mass and lump, left upper limb: Secondary | ICD-10-CM

## 2023-08-30 NOTE — Telephone Encounter (Signed)
Patient aware and orders sent  

## 2023-08-31 ENCOUNTER — Other Ambulatory Visit: Payer: Self-pay | Admitting: Obstetrics and Gynecology

## 2023-08-31 DIAGNOSIS — R2232 Localized swelling, mass and lump, left upper limb: Secondary | ICD-10-CM

## 2023-08-31 DIAGNOSIS — N644 Mastodynia: Secondary | ICD-10-CM

## 2023-08-31 DIAGNOSIS — R590 Localized enlarged lymph nodes: Secondary | ICD-10-CM

## 2023-08-31 DIAGNOSIS — Z853 Personal history of malignant neoplasm of breast: Secondary | ICD-10-CM

## 2023-08-31 DIAGNOSIS — N6325 Unspecified lump in the left breast, overlapping quadrants: Secondary | ICD-10-CM

## 2023-08-31 NOTE — Progress Notes (Signed)
 Referral to patient's breast surgeon, Dr. Cherlynn Cornfield.   Patient currently is declining an axillary biopsy.

## 2023-09-06 NOTE — Telephone Encounter (Signed)
Routing to [employer].   Encounter closed

## 2023-10-23 ENCOUNTER — Encounter: Payer: Self-pay | Admitting: Physician Assistant

## 2023-10-23 ENCOUNTER — Other Ambulatory Visit: Payer: Self-pay | Admitting: Family Medicine

## 2023-10-23 DIAGNOSIS — R413 Other amnesia: Secondary | ICD-10-CM

## 2023-11-12 NOTE — Progress Notes (Signed)
 Assessment/Plan:   Paula Dawson is a very pleasant 78 y.o. year old RH female with a history of hypertension, hyperlipidemia, history of left breast cancer with adenopathy, hypothyroidism, osteoporosis, vitamin D  deficiency seen today for evaluation of memory loss. MoCA today is 17/30. Etiology unclear, workup is in progress.  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 Patient is able to participate on ADLs and continues to drive without significant difficulties.  Discussed the below plan, she wants to think about scheduling neuropsych evaluation or proceeding with any imaging studies. She is not keen to do further studies. She will let us  know.    Memory Impairment of unclear etiology  Patient has an order for  MRI brain with and without  contrast by her PCP, to assess for underlying structural abnormality and assess vascular load. Recommend that she proceeds with the study. Recommend Neurocognitive testing to further evaluate cognitive concerns and determine other underlying cause of memory changes, including potential contribution from sleep, anxiety, attention, or depression  Recommend to Check B12, TSH Continue to control mood as per PCP Recommend good control of cardiovascular risk factors Folllow up after neuropsych evaluation if she decides to proceed. Until the results are not available, will not start antidementia medication.   Subjective:   The patient is here alone   How long did patient have memory difficulties? For the last couple months. Initially she attributed to age, but her son became concerned after a May Day parade, when I did not shut off the car prior tot the event.   Reports some difficulty remembering new information, conversations and names.  Long-term memory is good. repeats oneself? Denies  Disoriented when walking into a room?  Patient denies except occasionally not remembering what patient came to the room for    Leaving objects in unusual places? Denies   Wandering  behavior?  denies .  Any personality changes?  Denies.   Any history of depression?:  Denies   Hallucinations or paranoia?  Denies   Seizures?  Denies    Any sleep changes?   Sleeps well. Denies vivid dreams, REM behavior or sleepwalking   Sleep apnea?  Denies   Any hygiene concerns?  Denies   Independent of bathing and dressing?  Endorsed  Does the patient needs help with medications? Patient is in charge    Who is in charge of the finances? Patient is in charge     Any changes in appetite?  Denies     Patient have trouble swallowing? Denies.   Does the patient cook? Not so much     Any kitchen accidents such as leaving the stove on? Denies.   Any history of headaches?   Denies.   Chronic pain ? Denies.   Ambulates with difficulty?  Denies. Reports being very active, likes doing gardening  Recent falls or head injuries? Denies.   Vision changes? Has a history of macular degeneration   Any stroke like symptoms? Denies.   Any tremors?   Denies.   Any anosmia?  Denies.   Any incontinence of urine? Denies.   Any bowel dysfunction? Denies.      Patient lives alone    History of heavy alcohol intake? Denies.   History of heavy tobacco use? Denies.   Family history of dementia? Some of my old sisters and brothers in the 34s may have had dementia  Does patient drive? Yes , denies any issues  Retired from [employer] and [employer].  High school  Past Medical  History:  Diagnosis Date   Anemia    Aortic atherosclerosis (HCC) 12/23/2016   Noted on CT   Arthritis    BRCA gene mutation negative 03/2016   Breast cancer (HCC) 2017   Left Breast Cancer   Breast cancer in female [facility]) 12/2015   left breast cancer, Estrogen negative, progesterone slight positive   Bronchiectasis (HCC)    Complication of anesthesia    Dry eye syndrome    Dyslipidemia    Dyspnea    Early cataract    Endometrial polyp    Family history of breast cancer    Family history of ovarian  cancer    Family history of prostate cancer    Gall stones    GERD (gastroesophageal reflux disease)    Hematuria 03/09/2018   History of bronchitis    History of hiatal hernia    History of kidney stones 03/09/2018   HSV infection    pos I and II   Hypothyroidism    Internal and external hemorrhoids without complication 04/08/2013   noted on colonoscopy    Lymphedema    Left breast   Macular degeneration of both eyes    Osteopenia    Osteoporosis 1998   Off Fosomax 2008, Prolia 03/17/11, 09/14/11, 03/16/12   Persistent dry cough    Personal history of radiation therapy 2017   Left Breast Cancer   PMB (postmenopausal bleeding)    PONV (postoperative nausea and vomiting)    Pulmonary nodule 11/13/2015   10 to 11 mm subpleural pulmonary nodules in the right middle and right upper lobes    Shoulder impingement, right 11/22/2016   Vitamin D  deficiency    Wears glasses      Past Surgical History:  Procedure Laterality Date   AUGMENTATION MAMMAPLASTY  1981   BREAST IMPLANT REMOVAL  1982   BREAST LUMPECTOMY Left 02/11/2016   BREAST LUMPECTOMY WITH RADIOACTIVE SEED AND SENTINEL LYMPH NODE BIOPSY Left 02/11/2016   Procedure: LEFT BREAST LUMPECTOMY WITH RADIOACTIVE SEED AND LEFT SENTINEL LYMPH NODE BIOPSY;  Surgeon: Jina Nephew, MD;  Location: [facility] OR;  Service: General;  Laterality: Left;   COLONOSCOPY  10/04   Dr Rosalie   COLONOSCOPY  04/07/08   normal - Dr. Rosalie   COLONOSCOPY  04/08/2013   DILATATION & CURETTAGE/HYSTEROSCOPY WITH MYOSURE N/A 03/19/2018   Procedure: DILATATION & CURETTAGE/HYSTEROSCOPY;  Surgeon: Jannis Kate Norris, MD;  Location: [facility] [place];  Service: Gynecology;  Laterality: N/A;   ELBOW SURGERY Right 12/1997   ESOPHAGOGASTRODUODENOSCOPY ENDOSCOPY  08/2012   [place] hernia with GERD   EXCISION OF BREAST BIOPSY Right 10/19/2017   Procedure: EXCISIONAL BIOPSY OF RIGHT BREAST;  Surgeon: Nephew Jina, MD;  Location: [facility];  Service:  General;  Laterality: Right;   FOOT NEUROMA SURGERY Left 2004?   TONSILLECTOMY AND ADENOIDECTOMY     TUBAL LIGATION  1981     Allergies  Allergen Reactions   Codeine Nausea Only    Current Outpatient Medications  Medication Instructions   CALCIUM  PO Take by mouth.   Cholecalciferol (VITAMIN D3) 5000 units CAPS 1 capsule, Daily   denosumab (PROLIA) 60 mg, Every 6 months   ezetimibe (ZETIA) 10 mg, Daily   Iron 28 mg, Daily   levothyroxine (SYNTHROID) 75 MCG tablet 1 tablet Orally Once a day in the morning on an empty stomach for 90 days   Multiple Vitamin (MULTI-VITAMIN PO) 2 times daily PRN   Propylene Glycol (SYSTANE BALANCE OP) 1 tablet, Daily  RESTASIS 0.05 % ophthalmic emulsion 1 drop, 2 times daily     VITALS:   Vitals:   11/15/23 1241  BP: (!) 144/76  Pulse: 69  SpO2: 93%  Weight: 116 lb (52.6 kg)  Height: 5' 2 (1.575 m)         11/15/2023    2:00 PM  Montreal Cognitive Assessment   Visuospatial/ Executive (0/5) 1  Naming (0/3) 3  Attention: Read list of digits (0/2) 2  Attention: Read list of letters (0/1) 1  Attention: Serial 7 subtraction starting at 100 (0/3) 1  Language: Repeat phrase (0/2) 0  Language : Fluency (0/1) 1  Abstraction (0/2) 0  Delayed Recall (0/5) 2  Orientation (0/6) 5  Total 16  Adjusted Score (based on education) 17        No data to display           PHYSICAL EXAM   HEENT:  Normocephalic, atraumatic. The superficial temporal arteries are without ropiness or tenderness. Cardiovascular: Regular rate and rhythm. Lungs: Clear to auscultation bilaterally. Neck: There are no carotid bruits noted bilaterally.  Orientation:  Alert and oriented to person, place and time. No aphasia or dysarthria. Fund of knowledge is appropriate. Recent and remote memory intact.  Attention and concentration are reduced. Able to name objects and unable to repeat phrases. Delayed recall 2 /5 Cranial nerves: There is good facial symmetry.  Extraocular muscles are intact and visual fields are full to confrontational testing. Speech is fluent and clear. No tongue deviation. Hearing is intact to conversational tone. Tone: Tone is good throughout. Abnormal movements: No tremors. No Asterixis. No Fasciculations Sensation: Sensation is intact to light touch. Vibration is intact at the bilateral big toe.  Coordination: The patient has no difficulty with RAM's or FNF bilaterally. Normal finger to nose  Motor: Strength is 5/5 in the bilateral upper and lower extremities. There is no pronator drift. There are no fasciculations noted. DTR's: Deep tendon reflexes are 2/4 bilaterally. Gait and Station: The patient is able to ambulate without difficulty The patient is able to heel toe walk. Gait is cautious and narrow. The patient is able to ambulate in a tandem fashion.       Thank you for allowing us  the opportunity to participate in the care of this nice patient. Please do not hesitate to contact us  for any questions or concerns.   Total time spent on today's visit was 40 minutes dedicated to this patient today, preparing to see patient, examining the patient, ordering tests and/or medications and counseling the patient, documenting clinical information in the EHR or other health record, independently interpreting results and communicating results to the patient/family, discussing treatment and goals, answering patient's questions and coordinating care.  Cc:  Teresa Channel, MD  Camie Sevin 11/15/2023 2:15 PM

## 2023-11-15 ENCOUNTER — Other Ambulatory Visit

## 2023-11-15 ENCOUNTER — Ambulatory Visit

## 2023-11-15 ENCOUNTER — Encounter: Payer: Self-pay | Admitting: Physician Assistant

## 2023-11-15 ENCOUNTER — Ambulatory Visit (INDEPENDENT_AMBULATORY_CARE_PROVIDER_SITE_OTHER): Admitting: Physician Assistant

## 2023-11-15 VITALS — BP 144/76 | HR 69 | Ht 62.0 in | Wt 116.0 lb

## 2023-11-15 DIAGNOSIS — R413 Other amnesia: Secondary | ICD-10-CM | POA: Diagnosis not present

## 2023-11-15 LAB — TSH: TSH: 0.1 m[IU]/L — ABNORMAL LOW (ref 0.40–4.50)

## 2023-11-15 LAB — VITAMIN B12: Vitamin B-12: 674 pg/mL (ref 200–1100)

## 2023-11-15 NOTE — Patient Instructions (Addendum)
 It was a pleasure to see you today at our office.   Recommendations:  Neurocognitive evaluation at our office   Check labs today  [address] Follow up  after the neurocognitive testing    [email or web address]   RECOMMENDATIONS FOR ALL PATIENTS WITH MEMORY PROBLEMS: 1. Continue to exercise (Recommend 30 minutes of walking everyday, or 3 hours every week) 2. Increase social interactions - continue going to [place] and enjoy social gatherings with friends and family 3. Eat healthy, avoid fried foods and eat more fruits and vegetables 4. Maintain adequate blood pressure, blood sugar, and blood cholesterol level. Reducing the risk of stroke and cardiovascular disease also helps promoting better memory. 5. Avoid stressful situations. Live a simple life and avoid aggravations. Organize your time and prepare for the next day in anticipation. 6. Sleep well, avoid any interruptions of sleep and avoid any distractions in the bedroom that may interfere with adequate sleep quality 7. Avoid sugar, avoid sweets as there is a strong link between excessive sugar intake, diabetes, and cognitive impairment We discussed the Mediterranean diet, which has been shown to help patients reduce the risk of progressive memory disorders and reduces cardiovascular risk. This includes eating fish, eat fruits and green leafy vegetables, nuts like almonds and hazelnuts, walnuts, and also use olive oil. Avoid fast foods and fried foods as much as possible. Avoid sweets and sugar as sugar use has been linked to worsening of memory function.  There is always a concern of gradual progression of memory problems. If this is the case, then we may need to adjust level of care according to patient needs. Support, both to the patient and caregiver, should then be put into place.      You have been referred for a neuropsychological evaluation (i.e., evaluation of memory and thinking  abilities). Please bring someone with you to this appointment if possible, as it is helpful for the doctor to hear from both you and another adult who knows you well. Please bring eyeglasses and hearing aids if you wear them.    The evaluation will take approximately 3 hours and has two parts:   The first part is a clinical interview with the neuropsychologist (Dr. Richie or Dr. Gayland). During the interview, the neuropsychologist will speak with you and the individual you brought to the appointment.    The second part of the evaluation is testing with the doctor's technician Neal or Luke). During the testing, the technician will ask you to remember different types of material, solve problems, and answer some questionnaires. Your family member will not be present for this portion of the evaluation.   Please note: We must reserve several hours of the neuropsychologist's time and the psychometrician's time for your evaluation appointment. As such, there is a No-Show fee of $100. If you are unable to attend any of your appointments, please contact our office as soon as possible to reschedule.      DRIVING: Regarding driving, in patients with progressive memory problems, driving will be impaired. We advise to have someone else do the driving if trouble finding directions or if minor accidents are reported. Independent driving assessment is available to determine safety of driving.   If you are interested in the driving assessment, you can contact the following:  [facility] in [place] [phone]  [facility] [phone]  [facility] [phone]  [facility] [phone] or [phone]   FALL PRECAUTIONS: Be cautious when walking. Scan the area for obstacles that  may increase the risk of trips and falls. When getting up in the mornings, sit up at the edge of the bed for a few minutes before getting out of bed. Consider elevating the bed at  the head end to avoid drop of blood pressure when getting up. Walk always in a well-lit room (use night lights in the walls). Avoid area rugs or power cords from appliances in the middle of the walkways. Use a walker or a cane if necessary and consider physical therapy for balance exercise. Get your eyesight checked regularly.  FINANCIAL OVERSIGHT: Supervision, especially oversight when making financial decisions or transactions is also recommended.  HOME SAFETY: Consider the safety of the kitchen when operating appliances like stoves, microwave oven, and blender. Consider having supervision and share cooking responsibilities until no longer able to participate in those. Accidents with firearms and other hazards in the house should be identified and addressed as well.   ABILITY TO BE LEFT ALONE: If patient is unable to contact 911 operator, consider using LifeLine, or when the need is there, arrange for someone to stay with patients. Smoking is a fire hazard, consider supervision or cessation. Risk of wandering should be assessed by caregiver and if detected at any point, supervision and safe proof recommendations should be instituted.  MEDICATION SUPERVISION: Inability to self-administer medication needs to be constantly addressed. Implement a mechanism to ensure safe administration of the medications.      Mediterranean Diet A Mediterranean diet refers to food and lifestyle choices that are based on the traditions of countries located on the [employer]. This way of eating has been shown to help prevent certain conditions and improve outcomes for people who have chronic diseases, like kidney disease and heart disease. What are tips for following this plan? Lifestyle  Cook and eat meals together with your family, when possible. Drink enough fluid to keep your urine clear or pale yellow. Be physically active every day. This includes: Aerobic exercise like running or swimming. Leisure  activities like gardening, walking, or housework. Get 7-8 hours of sleep each night. If recommended by your health care provider, drink red wine in moderation. This means 1 glass a day for nonpregnant women and 2 glasses a day for men. A glass of wine equals 5 oz (150 mL). Reading food labels  Check the serving size of packaged foods. For foods such as rice and pasta, the serving size refers to the amount of cooked product, not dry. Check the total fat in packaged foods. Avoid foods that have saturated fat or trans fats. Check the ingredients list for added sugars, such as corn syrup. Shopping  At the grocery store, buy most of your food from the areas near the walls of the store. This includes: Fresh fruits and vegetables (produce). Grains, beans, nuts, and seeds. Some of these may be available in unpackaged forms or large amounts (in bulk). Fresh seafood. Poultry and eggs. Low-fat dairy products. Buy whole ingredients instead of prepackaged foods. Buy fresh fruits and vegetables in-season from local farmers markets. Buy frozen fruits and vegetables in resealable bags. If you do not have access to quality fresh seafood, buy precooked frozen shrimp or canned fish, such as tuna, salmon, or sardines. Buy small amounts of raw or cooked vegetables, salads, or olives from the deli or salad bar at your store. Stock your pantry so you always have certain foods on hand, such as olive oil, canned tuna, canned tomatoes, rice, pasta, and beans. Cooking  [employer]  with extra-virgin olive oil instead of using butter or other vegetable oils. Have meat as a side dish, and have vegetables or grains as your main dish. This means having meat in small portions or adding small amounts of meat to foods like pasta or stew. Use beans or vegetables instead of meat in common dishes like chili or lasagna. Experiment with different cooking methods. Try roasting or broiling vegetables instead of steaming or sauteing  them. Add frozen vegetables to soups, stews, pasta, or rice. Add nuts or seeds for added healthy fat at each meal. You can add these to yogurt, salads, or vegetable dishes. Marinate fish or vegetables using olive oil, lemon juice, garlic, and fresh herbs. Meal planning  Plan to eat 1 vegetarian meal one day each week. Try to work up to 2 vegetarian meals, if possible. Eat seafood 2 or more times a week. Have healthy snacks readily available, such as: Vegetable sticks with hummus. Greek yogurt. Fruit and nut trail mix. Eat balanced meals throughout the week. This includes: Fruit: 2-3 servings a day Vegetables: 4-5 servings a day Low-fat dairy: 2 servings a day Fish, poultry, or lean meat: 1 serving a day Beans and legumes: 2 or more servings a week Nuts and seeds: 1-2 servings a day Whole grains: 6-8 servings a day Extra-virgin olive oil: 3-4 servings a day Limit red meat and sweets to only a few servings a month What are my food choices? Mediterranean diet Recommended Grains: Whole-grain pasta. Brown rice. Bulgar wheat. Polenta. Couscous. Whole-wheat bread. [place] Made[place]. Vegetables: Artichokes. Beets. Broccoli. Cabbage. Carrots. Eggplant. Green beans. Chard. Kale. Spinach. Onions. Leeks. Peas. Squash. Tomatoes. Peppers. Radishes. Fruits: Apples. Apricots. Avocado. Berries. Bananas. Cherries. Dates. Figs. Grapes. Lemons. Melon. Oranges. Peaches. Plums. Pomegranate. Meats and other protein foods: Beans. Almonds. Sunflower seeds. Pine nuts. Peanuts. Cod. Salmon. Scallops. Shrimp. Tuna. Tilapia. Clams. Oysters. Eggs. Dairy: Low-fat milk. Cheese. Greek yogurt. Beverages: Water. Red wine. Herbal tea. Fats and oils: Extra virgin olive oil. Avocado oil. Grape seed oil. Sweets and desserts: [place] yogurt with honey. Baked apples. Poached pears. Trail mix. Seasoning and other foods: Basil. Cilantro. Coriander. Cumin. Mint. Parsley. Sage. Rosemary. Tarragon. Garlic. Oregano. Thyme. Pepper.  Balsalmic vinegar. Tahini. Hummus. Tomato sauce. Olives. Mushrooms. Limit these Grains: Prepackaged pasta or rice dishes. Prepackaged cereal with added sugar. Vegetables: Deep fried potatoes (french fries). Fruits: Fruit canned in syrup. Meats and other protein foods: Beef. Pork. Lamb. Poultry with skin. Hot dogs. [place]. Dairy: Ice cream. Sour cream. Whole milk. Beverages: Juice. Sugar-sweetened soft drinks. Beer. Liquor and spirits. Fats and oils: Butter. Canola oil. Vegetable oil. Beef fat (tallow). Lard. Sweets and desserts: Cookies. Cakes. Pies. Candy. Seasoning and other foods: Mayonnaise. Premade sauces and marinades. The items listed may not be a complete list. Talk with your dietitian about what dietary choices are right for you. Summary The Mediterranean diet includes both food and lifestyle choices. Eat a variety of fresh fruits and vegetables, beans, nuts, seeds, and whole grains. Limit the amount of red meat and sweets that you eat. Talk with your health care provider about whether it is safe for you to drink red wine in moderation. This means 1 glass a day for nonpregnant women and 2 glasses a day for men. A glass of wine equals 5 oz (150 mL). This information is not intended to replace advice given to you by your health care provider. Make sure you discuss any questions you have with your health care provider. Document Released: 12/24/2015 Document Revised: 01/26/2016  Document Reviewed: 12/24/2015 Elsevier Interactive Patient Education  2017 [employer].

## 2023-11-16 ENCOUNTER — Ambulatory Visit: Payer: Self-pay | Admitting: Physician Assistant

## 2023-11-16 ENCOUNTER — Encounter: Payer: Self-pay | Admitting: Family Medicine

## 2023-11-27 ENCOUNTER — Ambulatory Visit
Admission: RE | Admit: 2023-11-27 | Discharge: 2023-11-27 | Disposition: A | Discharge status: Home / Self Care | Source: Ambulatory Visit | Attending: Family Medicine | Admitting: Family Medicine

## 2023-11-27 DIAGNOSIS — R413 Other amnesia: Secondary | ICD-10-CM

## 2023-11-27 MED ORDER — GADOPICLENOL 0.5 MMOL/ML IV SOLN
6.0000 mL | Freq: Once | INTRAVENOUS | Status: AC | PRN
  Administered 2023-11-27: 6 mL via INTRAVENOUS

## 2024-01-24 ENCOUNTER — Other Ambulatory Visit: Payer: Self-pay | Admitting: General Surgery

## 2024-01-24 ENCOUNTER — Ambulatory Visit

## 2024-01-24 ENCOUNTER — Ambulatory Visit: Admitting: Physician Assistant

## 2024-01-24 DIAGNOSIS — Z853 Personal history of malignant neoplasm of breast: Secondary | ICD-10-CM

## 2024-01-24 DIAGNOSIS — R2232 Localized swelling, mass and lump, left upper limb: Secondary | ICD-10-CM

## 2024-02-01 ENCOUNTER — Other Ambulatory Visit

## 2024-02-01 ENCOUNTER — Encounter

## 2024-02-14 ENCOUNTER — Other Ambulatory Visit (HOSPITAL_BASED_OUTPATIENT_CLINIC_OR_DEPARTMENT_OTHER): Payer: Self-pay | Admitting: Family Medicine

## 2024-02-14 DIAGNOSIS — M810 Age-related osteoporosis without current pathological fracture: Secondary | ICD-10-CM

## 2024-02-19 ENCOUNTER — Ambulatory Visit: Payer: Medicare Other | Admitting: Obstetrics and Gynecology

## 2024-02-19 ENCOUNTER — Encounter: Payer: Self-pay | Admitting: Obstetrics and Gynecology

## 2024-02-19 ENCOUNTER — Other Ambulatory Visit (HOSPITAL_COMMUNITY)
Admission: RE | Admit: 2024-02-19 | Discharge: 2024-02-19 | Disposition: A | Discharge status: Home / Self Care | Source: Ambulatory Visit | Attending: Obstetrics and Gynecology | Admitting: Obstetrics and Gynecology

## 2024-02-19 VITALS — BP 138/74 | HR 64 | Resp 14 | Wt 113.0 lb

## 2024-02-19 DIAGNOSIS — Z01419 Encounter for gynecological examination (general) (routine) without abnormal findings: Secondary | ICD-10-CM

## 2024-02-19 DIAGNOSIS — R2232 Localized swelling, mass and lump, left upper limb: Secondary | ICD-10-CM | POA: Diagnosis not present

## 2024-02-19 DIAGNOSIS — B009 Herpesviral infection, unspecified: Secondary | ICD-10-CM

## 2024-02-19 DIAGNOSIS — N812 Incomplete uterovaginal prolapse: Secondary | ICD-10-CM

## 2024-02-19 DIAGNOSIS — Z9289 Personal history of other medical treatment: Secondary | ICD-10-CM

## 2024-02-19 DIAGNOSIS — Z124 Encounter for screening for malignant neoplasm of cervix: Secondary | ICD-10-CM | POA: Diagnosis present

## 2024-02-19 NOTE — Patient Instructions (Signed)

## 2024-02-19 NOTE — Progress Notes (Unsigned)
 78 y.o. G47P2001 Divorced Caucasian female here for a breast and pelvic exam.    The patient is followed for HSV I and II. She states she does not need Valtrex  as she is not having outbreaks.   Declines Rx.    Declines vaginal bleeding, discharge, bladder or bowel concerns.   Her bladder drops, and she does not want to return to a pessary.  She pushes the prolapse back up.    Has seen Dr. Aron for a breast recheck regarding her left axillary region.   She has a mammogram and US  scheduled.   Patient states she has memory issues and is seeing [facility].   PCP: Teresa Channel, MD   Patient's last menstrual period was 05/16/2000 (approximate).           Sexually active: No.  The current method of family planning is post menopausal status.    Menopausal hormone therapy:  n/a Exercising: Yes.    Gardening, steps, walking Smoker:  no  OB History     Gravida  2   Para  2   Term  2   Preterm  0   AB  0   Living  1      SAB  0   IAB  0   Ectopic  0   Multiple  0   Live Births  1           HEALTH MAINTENANCE: Last 2 paps: 03/02/18 neg, 05/29/14 neg History of abnormal Pap or positive HPV:  no Mammogram:  08/30/23 L Breast- Breast Density Cat B, BIRADS Cat 4 Sus, scheduled 03/07/24 Colonoscopy:  04/08/13, scheduled for 03/04/24 Bone Density:  07/01/22  Result  osteoporosis.  On Prolia.  Will do [facility].  Immunization History  Administered Date(s) Administered   INFLUENZA, HIGH DOSE SEASONAL PF 01/08/2016, 01/09/2017, 02/15/2021   Influenza-Unspecified 01/02/2018, 02/15/2019   PNEUMOCOCCAL CONJUGATE-20 02/12/2024   Pneumococcal Conjugate-13 05/16/2014   Pneumococcal Polysaccharide-23 05/16/2013   Tdap 02/08/2011   Zoster, Live 11/13/2009      reports that she has never smoked. She has never used smokeless tobacco. She reports that she does not drink alcohol and does not use drugs.  Past Medical History:  Diagnosis Date   Anemia     Aortic atherosclerosis 12/23/2016   Noted on CT   Arthritis    BRCA gene mutation negative 03/2016   Breast cancer (HCC) 2017   Left Breast Cancer   Breast cancer in female [facility]) 12/2015   left breast cancer, Estrogen negative, progesterone slight positive   Bronchiectasis (HCC)    Complication of anesthesia    Dry eye syndrome    Dyslipidemia    Dyspnea    Early cataract    Endometrial polyp    Family history of breast cancer    Family history of ovarian cancer    Family history of prostate cancer    Gall stones    GERD (gastroesophageal reflux disease)    Hematuria 03/09/2018   History of bronchitis    History of hiatal hernia    History of kidney stones 03/09/2018   HSV infection    pos I and II   Hypothyroidism    Internal and external hemorrhoids without complication 04/08/2013   noted on colonoscopy    Lymphedema    Left breast   Macular degeneration of both eyes    Osteopenia    Osteoporosis 1998   Off Fosomax 2008, Prolia 03/17/11, 09/14/11, 03/16/12  Persistent dry cough    Personal history of radiation therapy 2017   Left Breast Cancer   PMB (postmenopausal bleeding)    PONV (postoperative nausea and vomiting)    Pulmonary nodule 11/13/2015   10 to 11 mm subpleural pulmonary nodules in the right middle and right upper lobes    Shoulder impingement, right 11/22/2016   Vitamin D  deficiency    Wears glasses     Past Surgical History:  Procedure Laterality Date   AUGMENTATION MAMMAPLASTY  1981   BREAST IMPLANT REMOVAL  1982   BREAST LUMPECTOMY Left 02/11/2016   BREAST LUMPECTOMY WITH RADIOACTIVE SEED AND SENTINEL LYMPH NODE BIOPSY Left 02/11/2016   Procedure: LEFT BREAST LUMPECTOMY WITH RADIOACTIVE SEED AND LEFT SENTINEL LYMPH NODE BIOPSY;  Surgeon: Jina Nephew, MD;  Location: [facility] OR;  Service: General;  Laterality: Left;   COLONOSCOPY  10/04   Dr Rosalie   COLONOSCOPY  04/07/08   normal - Dr. Rosalie   COLONOSCOPY  04/08/2013   DILATATION &  CURETTAGE/HYSTEROSCOPY WITH MYOSURE N/A 03/19/2018   Procedure: DILATATION & CURETTAGE/HYSTEROSCOPY;  Surgeon: Jannis Kate Norris, MD;  Location: [facility];  Service: Gynecology;  Laterality: N/A;   ELBOW SURGERY Right 12/1997   ESOPHAGOGASTRODUODENOSCOPY ENDOSCOPY  08/2012   [place] hernia with GERD   EXCISION OF BREAST BIOPSY Right 10/19/2017   Procedure: EXCISIONAL BIOPSY OF RIGHT BREAST;  Surgeon: Nephew Jina, MD;  Location: [facility];  Service: General;  Laterality: Right;   FOOT NEUROMA SURGERY Left 2004?   TONSILLECTOMY AND ADENOIDECTOMY     TUBAL LIGATION  1981    Current Outpatient Medications  Medication Sig Dispense Refill   CALCIUM  PO Take by mouth.     Cholecalciferol (VITAMIN D3) 5000 units CAPS Take 1 capsule by mouth daily.     denosumab (PROLIA) 60 MG/ML SOLN injection Inject 60 mg into the skin every 6 (six) months. Administer in upper arm, thigh, or abdomen     ezetimibe (ZETIA) 10 MG tablet Take 10 mg by mouth daily.     Ferrous Sulfate (IRON) 28 MG TABS Take 28 mg by mouth daily. Mon, Wed, and Fridays only     levothyroxine (SYNTHROID) 75 MCG tablet 1 tablet Orally Once a day in the morning on an empty stomach for 90 days     Multiple Vitamin (MULTI-VITAMIN PO) Take by mouth 2 (two) times daily as needed. Women's 50 +     Propylene Glycol (SYSTANE BALANCE OP) Apply 1 tablet to eye daily.     RESTASIS 0.05 % ophthalmic emulsion Place 1 drop into both eyes 2 (two) times daily.      SYNTHROID 50 MCG tablet Take 50 mcg by mouth every morning.     No current facility-administered medications for this visit.    ALLERGIES: Codeine  Family History  Problem Relation Age of Onset   Heart failure Mother    Prostate cancer Father 3   Liver cancer Father    Colon polyps Sister    Breast cancer Sister 84   Breast cancer Sister 42   Breast cancer Sister 77       recurrance at 73   Parkinsonism Sister    Breast cancer Sister 17   Colon  polyps Sister    Lung cancer Brother        smoked   Prostate cancer Brother 9   Melanoma Brother    Breast cancer Paternal Aunt    Ovarian cancer Cousin  died at 46   Breast cancer Other        X 4 + age 41, 63 with recurrence, 70, 50   Colon cancer Other 50    Review of Systems  All other systems reviewed and are negative.   PHYSICAL EXAM:  BP 138/74   Pulse 64   Resp 14   Wt 113 lb (51.3 kg)   LMP 05/16/2000 (Approximate)   BMI 20.67 kg/m     General appearance: alert, cooperative and appears stated age Head: normocephalic, without obvious abnormality, atraumatic Neck: no adenopathy, supple, symmetrical, trachea midline and thyroid normal to inspection and palpation Lungs: clear to auscultation bilaterally Breasts: normal appearance, no masses or tenderness, No nipple retraction or dimpling, No nipple discharge or bleeding, No axillary adenopathy Heart: regular rate and rhythm Abdomen: soft, non-tender; no masses, no organomegaly Extremities: extremities normal, atraumatic, no cyanosis or edema Skin: skin color, texture, turgor normal. No rashes or lesions Lymph nodes: cervical, supraclavicular, and axillary nodes normal. Neurologic: grossly normal  Pelvic: External genitalia:  no lesions              No abnormal inguinal nodes palpated.              Urethra:  normal appearing urethra with no masses, tenderness or lesions              Bartholins and Skenes: normal                 Vagina: normal appearing vagina with normal color and discharge, no lesions              Cervix: no lesions              Pap taken: {yes no:314532} Bimanual Exam:  Uterus:  normal size, contour, position, consistency, mobility, non-tender              Adnexa: no mass, fullness, tenderness              Rectal exam: {yes no:314532}.  Confirms.              Anus:  normal sphincter tone, no lesions  Chaperone was present for exam:  {BSCHAPERONE:31226::Emily F,  CMA}  ASSESSMENT: Encounter for breast and pelvic exam.  Hx HSV I and II. Not taking Valtrex .  Hx breast implant removal.  Hx DCIS left breast.  Status post lumpectomy, XRT, Tamoxifen  for 3 years.  Status post removal of right nipple due to inversion of nipple.  Pathology:  periductal chronic inflammation.  FH breast cancer. Negative BRCA.  Left axillary masses palpable today.  Dr. Aron following.   Incomplete uterovaginal prolapse.  Pessary made incontinence worse.  No current signs of progression.  Osteopenia.  On Prolia.  PCP following.  ***  PLAN: Mammogram screening discussed. Self breast awareness reviewed. Pap and HRV collected:  yes Guidelines for Calcium , Vitamin D , regular exercise program including cardiovascular and weight bearing exercise. Medication refills:  *** {LABS (Optional):23779} Follow up:  ***    Additional counseling given.  {yes [id number]. ***  total time was spent for this patient encounter, including preparation, face-to-face counseling with the patient, coordination of care, and documentation of the encounter in addition to doing the breast and pelvic exam.

## 2024-02-21 ENCOUNTER — Ambulatory Visit: Payer: Self-pay | Admitting: Obstetrics and Gynecology

## 2024-02-21 LAB — CYTOLOGY - PAP: Diagnosis: NEGATIVE

## 2024-02-28 ENCOUNTER — Ambulatory Visit: Payer: Self-pay

## 2024-02-28 ENCOUNTER — Ambulatory Visit (INDEPENDENT_AMBULATORY_CARE_PROVIDER_SITE_OTHER): Admitting: Psychology

## 2024-02-28 DIAGNOSIS — R4189 Other symptoms and signs involving cognitive functions and awareness: Secondary | ICD-10-CM

## 2024-02-28 DIAGNOSIS — F0670 Mild neurocognitive disorder due to known physiological condition without behavioral disturbance: Secondary | ICD-10-CM | POA: Diagnosis not present

## 2024-02-28 NOTE — Progress Notes (Signed)
   Psychometrician Note   Cognitive testing was administered to Paula Dawson by Lonell Jude, B.S. (psychometrist) under the supervision of Dr. Renda Beckwith, Psy.D., licensed psychologist on 02/28/2024. [name] did not appear overtly distressed by the testing session per behavioral observation or responses across self-report questionnaires. Rest breaks were offered.   The battery of tests administered was selected by Dr. Renda Beckwith, Psy.D. with consideration to Paula Dawson current level of functioning, the nature of her symptoms, emotional and behavioral responses during interview, level of literacy, observed level of motivation/effort, and the nature of the referral question. This battery was communicated to the psychometrist. Communication between Dr. Renda Beckwith, Psy.D. and the psychometrist was ongoing throughout the evaluation and Dr. Renda Beckwith, Psy.D. was immediately accessible at all times. Dr. Renda Beckwith, Psy.D. provided supervision to the psychometrist on the date of this service to the extent necessary to assure the quality of all services provided.    Paula Dawson will return within approximately 1-2 weeks for an interactive feedback session with Dr. Beckwith at which time her test performances, clinical impressions, and treatment recommendations will be reviewed in detail. Paula Dawson understands she can contact our office should she require our assistance before this time.  A total of 120 minutes of billable time were spent face-to-face with Paula Dawson by the psychometrist. This includes both test administration and scoring time. Billing for these services is reflected in the clinical report generated by Dr. Renda Beckwith, Psy.D.  This note reflects time spent with the psychometrician and does not include test scores or any clinical interpretations made by Dr. Beckwith. The full report will follow in a separate note.

## 2024-02-28 NOTE — Progress Notes (Signed)
 NEUROPSYCHOLOGICAL EVALUATION [place]. [facility]  [facility] Department of Neurology  Date of Evaluation: 02/28/2024  REASON FOR REFERRAL   Paula Dawson is a 78 year old, right-handed, White female with 12 years of formal education. She was referred for neuropsychological evaluation by Camie Sevin, PA-C, to assess current neurocognitive functioning, document potential cognitive deficits, and assist with treatment planning. This is her first neuropsychological evaluation.  SUMMARY OF RESULTS   Premorbid cognitive abilities are estimated to be in the average range based on word reading and sociodemographic factors. Relative to this baseline estimate, current performance was variable across most cognitive domains.  Specifically, performance on a simple auditory working memory task was intact but declined as the complexity of the task increased. Processing speed was generally slow, including on measures of visual scanning, rapid decoding, and visual attention/discrimination. She also demonstrated difficulty on certain executive functioning tasks, including alternating attention and abstract reasoning, but performed adequately on others such as phonemic fluency and judgment.   Regarding language, she generally managed well on a simple comprehension task but exhibited increased difficulty with a more complex comprehension task. Semantic fluency was low, unlike phonemic fluency, which was within normal limits. Confrontation naming was within expectations. Visuospatial abilities were also relatively preserved.  Regarding learning/memory, she adequately encoded, recalled, and recognized a word list. Comparatively, she experienced some difficulty with the immediate recall of short stories; however, she performed well in recalling and recognizing then story details. She also generally performed well on the immediate and delayed recall of shapes but endorsed an elevated number of false  positive errors, which lowered her overall recognition score.  On self-report questionnaires, she did not endorse any symptoms of depression or anxiety.  DIAGNOSTIC IMPRESSION   Results of the current evaluation indicated variability across cognitive domains, with notable deficits in complex working memory--including its suspected impact on comprehension--as well as processing speed, executive functioning, and semantic fluency. 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 Etiology of the observed deficits is not entirely clear at this time. Interestingly, the patient presents with intermittent confusion, difficulty with complex comprehension, and slowed processing speed that have reportedly emerged over the past six months. Given this relatively recent onset, priority should be given to ruling out any acute or reversible medical contributors. Her most recent MRI did not reveal any major structural abnormalities or vascular changes that would account for the degree of slowed processing speed and executive dysfunction observed on testing today. It remains possible that some of these cognitive changes have been present for longer but were previously unrecognized. Regardless of the true onset, close monitoring will be important to detect any evolving pattern that may clarify the underlying etiology.   The pattern of deficits and clinical history do not clearly indicate a neurodegenerative condition such as Alzheimer's disease. However, since these recent changes might represent the early stages of such a condition, this possibility should remain in the differential. Blood-based biomarkers or cerebrospinal fluid analysis could be considered if more information is desired to assist with the differential diagnosis.  Of note, no significant issues related to psychological/emotional functioning, sleep, physical  functioning, or lifestyle factors were reported that would suggest alternative etiologies common for her age group.  ICD-10 Codes: F06.70 Mild neurocognitive disorder  RECOMMENDATIONS   A repeat neuropsychological evaluation in 18-24 months (or sooner if functional decline is noted) is recommended.  Since no one resides in the home who can casually oversee that  instrumental activities of daily living are being completed appropriately, the patient is encouraged to use available tools and strategies--such as medication organizers, pillboxes, automated reminders, and autopay services--to support the management of medications, finances, and medical appointments and to minimize the risk of errors.  Findings from this evaluation raise concern about the patient's ability to safely drive. Should the patient choose to continue driving, a referral for a comprehensive driving assessment is strongly advised. Possible evaluation centers to consider include the following:  [facility] in [place]: [phone] [facility] in [place]: [phone] [facility] in [place]: [phone] [facility] in [place]: [phone] or [phone]  Prioritize physical health through diet, exercise, and sleep. Regular physical activity supports cardiovascular health, improves mood, and helps preserve mobility and independence. Aim for at least 150 minutes of moderate aerobic exercise per week (e.g., brisk walking, swimming, gardening). A brain-healthy diet such as the Mediterranean or MIND diet is rich in fruits, vegetables, whole grains, healthy fats, and lean proteins, and has been associated with reduced risk of cognitive decline. Additionally, getting adequate, quality sleep and managing chronic conditions with the help of healthcare providers are essential components of healthy aging.  Continue to stay socially and mentally engaged. Maintaining strong  social connections and regularly stimulating your brain can help protect against cognitive decline. This includes staying connected with friends and family, volunteering, or participating in community groups. Mentally engaging activities--such as reading, doing puzzles, playing strategy games, or learning a new language or musical instrument--promote brain plasticity. If you are interested in activities to support cognitive engagement, this site offers a variety of apps and games organized by difficulty level:  https://www.[facility].org/get-to-know-barrow/centers-programs/neurorehabilitation-center/neuro-rehab-apps-and-games/  Consider implementing compensatory strategies to maximize independence and maintain daily functioning. Examples include:  Adhere to routine. Compensatory strategies work best when they are used consistently. Use a planner, calendar, or white board that has the schedule and important events for the day clearly listed to reference and cross off when tasks are complete.  Ask for written information, especially if it is new or unfamiliar (e.g., information provided at a doctor's appointment).  Create an organized environment. Keep items that can be easily misplaced in a sensible location and get into the habit of always returning the items to those places. Pay attention and reduce distractions. Make a point of focusing attention on information you want to remember. One-on-one interaction is more likely to facilitate attention and minimize distraction. Make eye contact and repeat the information out loud after you hear it. Reduce interruptions or distractions especially when attempting to learn new information.  Create associations. When learning something new, think about and understand the information. Explain it in your own words or try to associate it with something you already know. Take notes to help remember important details. Evaluate goals and plan accordingly. When confronted  by many different tasks, begin by making a list that prioritizes each task and estimates the time it will take to complete. Break down complicated tasks into smaller, more manageable steps. Focus on one task at a time and complete each task before starting another. Avoid multitasking.  DISPOSITION   Patient will follow up with the referring provider, Ms. Wertman. She should return for repeat neuropsychological testing in 12-18 months to monitor her course and assist with diagnosis and treatment planning. She and her son will be provided verbal feedback in approximately one week regarding the findings and impression during this visit.  The remainder of the report includes the details of the patient's  background and a table of results from the current evaluation, which support the summary and recommendations described above.  BACKGROUND   History of Presenting Illness: The following information was obtained from a review of medical records and an interview with the patient and her son, Verdie. Briefly, the patient was evaluated by Camie Sevin, PA-C, at [facility] on 11/15/2023 due to memory concerns that developed over the past couple of months. MoCA = 17/30. She was referred for neuropsychological evaluation accordingly.  Cognitive Functioning: During today's appointment, the patient and her son reported that the cognitive changes have been relatively recent, emerging over approximately the past six months. Patient specifically endorsed word-finding difficulties, occasional misplacement of items, and possible slowing of processing speed. She was uncertain about the presence of more significant memory concerns and otherwise denied major difficulties with attention, navigation, or executive functioning (e.g., planning, organizing, multitasking). Her son's primary concern centered on episodes of confusion, which he described as occurring in a seemingly random and unpredictable manner. He provided  several examples. On one occasion, the patient forgot to turn off her car, which was uncharacteristic for her. Another time, when he asked her a question, she responded but not appropriately to the content of the question. She also became disoriented while driving to his house after a detour was implemented; rather than using navigation, she drove in circles until her son found her. Additionally, when given a birthday card with a joke on the front, she was unable to understand the humor, which he noted was not typical for her.  Physical Functioning: Patient denied difficulties with sleep initiation and maintenance. Appetite is stable, with no changes to sense of taste or smell reported. Hearing is stable. Regarding vision, she noted a history of cataracts, macular degeneration, and dry eyes. She has an upcoming vision appointment this Friday. She denied balance problems, falls, and tremors.  Emotional Functioning: Patient described her recent mood as good. She shared that she recently spent time with some of her high school friends, which she found very enjoyable. She denied any suicidal ideation. She reported that she enjoys taking walks and doing yard work.  Neuroimaging: MRI of the brain (11/27/2023) documented moderate generalized cerebral atrophy and few small foci of T2 FLAIR hyperintense signal abnormality scattered within the cerebral white matter (considered within normal limits for age).  Other Relevant Medical History: Remarkable for dyslipidemia, hypothyroidism, osteoporosis, and history of breast cancer. Please refer to the medical record for a more comprehensive problem list. No history of stroke, CNS infection, head injury, or seizure was reported.  Current Medications: Per patient, calcium , denosumab, ezetimibe, iron, multivitamin, Systane Balance, Restasis, Synthroid, and vitamin D3.   Functional Status: Patient independently performs all basic and instrumental (e.g., driving,  medications, finances) activities of daily living without issues. She also denied any problems operating household appliances.  Family Neurological History: Remarkable for late-life dementia in two of the patient's sisters, with one case suspected to be Alzheimer's disease.  Psychiatric History: History of depression, anxiety, prior mental health treatment, suicidal ideation, hallucinations, and psychiatric hospitalizations was not reported.  Substance Use History: Patient denied current use of alcohol, nicotine, marijuana, and illicit substances.  Social and Developmental History: Patient was born in [place], [place]. History of perinatal complications and developmental delays was not reported. She is divorced and lives alone. She has one son.  Educational and Occupational History: No history of childhood learning disability, special education services, or grade retention was reported. 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  She completed high school on time. 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  BEHAVIORAL OBSERVATIONS   Patient arrived on time and was accompanied by her son, Verdie. She ambulated independently and without gait disturbance. She was alert and fully oriented. She was appropriately groomed and dressed for the setting. No significant sensory or motor abnormalities were observed. Vision (with glasses) and hearing were adequate for testing purposes. Speech was of normal rate, prosody, and volume. No conversational word-finding difficulties, paraphasic errors, or dysarthria were observed. Comprehension was generally adequate for simple conversation but appeared reduced with longer or more complex sentences, at which point she demonstrated occasional difficulty responding appropriately. Similar difficulties were observed during testing when she became confused with task instructions and multistep commands.  Thought processes were linear, logical, and coherent. Thought content was organized and devoid of delusions. Insight appeared appropriate. Affect was even and congruent with euthymic mood. She was cooperative and gave adequate effort during testing, including on embedded measures of performance validity. Results are thought to accurately reflect her cognitive functioning at this time.  NEUROPSYCHOLOGICAL TESTING RESULTS   Tests Administered: Animal Naming Test; Boston Diagnostic Aphasia Examination (BDAE) - Subtest(s): Complex Ideational Material; Brief Visuospatial Memory Test-Revised (BVMT-R) - Form 1; [place]  Verbal Learning Test Third Edition (CVLT3) - Brief Form; Controlled Oral Word Association Test (COWAT): FAS; Geriatric Anxiety Scale-10 Item (GAS-10); Geriatric Depression Scale Short Form (GDS-SF); Neuropsychological Assessment Battery (NAB) Form 1 - Subtest(s): Naming, Visual Discrimination, Judgement; Test of Premorbid Functioning (TOPF); Trail Making Test (TMT); Wechsler Adult Intelligence Scale Fifth Edition (WAIS-5) - Subtest(s): Similarities, Clinical cytogeneticist, Matrix Reasoning, Digits Forward, Digit Sequencing, Coding, Symbol Search, Digits Backward; and Wechsler Memory Scale Fourth Edition (WMS-IV) - Subtest(s): Logical Memory (LM).  Test results are provided in the table below. Whenever possible, the patient's scores were compared against age-, sex-, and education-corrected normative samples. Interpretive descriptions are based on the AACN consensus conference statement on uniform labeling (Guilmette et al., 2020).  PREMORBID FUNCTIONING RAW  RANGE  TOPF 28 StdS=90 Average  ATTENTION & WORKING MEMORY RAW  RANGE  WAIS-5 Digits Forward -- ss=8 Average  WAIS-5 Digits Backward -- ss=5 Below Average  WAIS-5 Digit Sequencing -- ss=4 Below Average  PROCESSING SPEED RAW  RANGE  Trails A 109''3e T=21 Exceptionally Low  WAIS-5 Coding  -- ss=5 Below Average  WAIS-5 Symbol Search -- ss=3  Exceptionally Low  EXECUTIVE FUNCTION RAW  RANGE  Trails B D/C -- --  WAIS-5 Matrix Reasoning -- ss=4 Below Average  WAIS-5 Similarities -- ss=4 Below Average  COWAT Letter Fluency 10+8+12 T=44 Average  NAB Judgement -- T=55 Average  LANGUAGE RAW  RANGE  COWAT Letter Fluency 10+8+12 T=44 Average  Animal Naming Test 7 T=21 Exceptionally Low  NAB Auditory Comprehension -- T=45 Average  NAB Naming Test 26/31  +1 w/PC T=37 WNL  BDAE Complex Ideational Material 9/12 T=25 Exceptionally Low  VISUOSPATIAL RAW  RANGE  WAIS-5 Block Design -- ss=7 Low Average  NAB Visual Discrimination 11/18 T=37 Low Average  BVMT-R Copy Trial 12/12 -- WNL  VERBAL LEARNING & MEMORY RAW  RANGE  CVLT3 Total 1-4 2,7,6,8 StdS=89 Low Average  CVLT3 SDFR  8/9 ss=12 High Average  CVLT3 LDFR  7/9 ss=11 Average  CVLT3 LDCR  6/9 ss=8 Average  CVLT3 Recognition Hits 8 ss=10 Average  CVLT3 Recognition False+ 0 ss=12 High Average  CVLT3 Discriminability -- ss=11 Average  CVLT3 Intrusions 0 ss=12 High Average  CVLT3 Repetitions  0 ss=14 High Average  CVLT3 Forced Choice 9/9 -- WNL  WMS-IV LM-I  (3+7+4)/53 ss=5 Below Average  WMS-IV LM-II  (3+4)/39 ss=6 Low Average  WMS-IV LM Recognition  (7+9)/23 26-50%ile Average  VISUAL LEARNING & MEMORY RAW  RANGE  BVMT-R Total Recall (3+5+5)/36 T=38 Low Average  BVMT-R Delayed Recall 6/12 T=43 Average  BVMT-R Percent Retained >100 >16%ile WNL  BVMT-R Recognition Hits 5 >16%ile WNL  BVMT-R Recognition False Alarms 2 <1%ile Exceptionally Low  BVMT-R Recognition Discrimination Index 3 3-5%ile Below Average  QUESTIONNAIRES RAW  RANGE  GDS-SF 0 -- Minimal  GAS-10 0 -- Minimal  *Note: ss = scaled score; StdS = standard score; T = t-score; C/S = corrected raw score; WNL = within normal limits; BNL= below normal limits; D/C = discontinued. Scores from skewed distributions are typically interpreted as WNL (>=16th %ile) or BNL (<16th %ile).   INFORMED CONSENT   Patient was provided  with a verbal description of the nature and purpose of the neuropsychological evaluation. Also reviewed were the foreseeable risks and/or discomforts and benefits of the procedure, limits of confidentiality, and mandatory reporting requirements of this provider. Patient was given the opportunity to have their questions answered. Oral consent to participate was provided by the patient.   This report was prepared as part of a clinical evaluation and is not intended for forensic use.  SERVICE   This evaluation was conducted by Renda Beckwith, Psy.D. In addition to time spent directly with the patient, total professional time (180 minutes) includes record review, integration of relevant medical history, test selection, interpretation of findings, and report preparation. A technician, Lonell Jude, B.S., provided testing and scoring assistance (120 minutes).  Psychiatric Diagnostic Evaluation Services (Professional): [id number] x 1 Neuropsychological Testing Evaluation Services (Professional): [phone] x 1 Neuropsychological Testing Evaluation Services (Professional): 03866 x 2 Neuropsychological Test Administration and Scoring (Technician): [phone] x 1 Neuropsychological Test Administration and Scoring (Technician): [phone] x 3  This report was generated using voice recognition software. While this document has been carefully reviewed, transcription errors may be present. I apologize in advance for any inconvenience. Please contact me if further clarification is needed.            Renda Beckwith, Psy.D.             Neuropsychologist

## 2024-03-06 ENCOUNTER — Ambulatory Visit (INDEPENDENT_AMBULATORY_CARE_PROVIDER_SITE_OTHER): Admitting: Psychology

## 2024-03-06 DIAGNOSIS — F0670 Mild neurocognitive disorder due to known physiological condition without behavioral disturbance: Secondary | ICD-10-CM

## 2024-03-06 NOTE — Progress Notes (Signed)
   NEUROPSYCHOLOGY FEEDBACK SESSION [place]. [facility]  [place] Department of Neurology  Date of Feedback Session: 03/06/2024  REASON FOR REFERRAL   Paula Dawson is a 78 year old, right-handed, White female with 12 years of formal education. She was referred for neuropsychological evaluation by Camie Sevin, PA-C, to assess current neurocognitive functioning, document potential cognitive deficits, and assist with treatment planning. This is her first neuropsychological evaluation.  FEEDBACK   Patient completed a comprehensive neuropsychological evaluation on 02/28/2024. Please refer to that encounter for the full report and recommendations. Briefly, results indicated variability across cognitive domains, with notable deficits in complex working memory--including its suspected impact on comprehension--as well as processing speed, executive functioning, and semantic fluency. 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 Etiology of the observed deficits is not entirely clear at this time. Interestingly, the patient presents with intermittent confusion, difficulty with complex comprehension, and slowed processing speed that have reportedly emerged over the past six months. Given this relatively recent onset, priority should be given to ruling out any acute or reversible medical contributors. Her most recent MRI did not reveal any major structural abnormalities or vascular changes that would account for the degree of slowed processing speed and executive dysfunction observed on testing today. It remains possible that some of these cognitive changes have been present for longer but were previously unrecognized. Regardless of the true onset, close monitoring will be important to detect any evolving pattern that may clarify the underlying etiology. The pattern of deficits and clinical history do  not clearly indicate a neurodegenerative condition such as Alzheimer's disease. However, since these recent changes might represent the early stages of such a condition, this possibility should remain in the differential. No significant issues related to psychological/emotional functioning, sleep, physical functioning, or lifestyle factors were reported that would suggest alternative etiologies common for her age group.  Today, the patient was unaccompanied. She was provided verbal feedback regarding the findings and impression during this visit, and her questions were answered. A copy of the report was provided at the conclusion of the visit.  DISPOSITION   Patient will follow up with the referring provider, Ms. Wertman. She should return for repeat neuropsychological testing in 18-24 months to monitor her course and assist with diagnosis and treatment planning.  SERVICE   This feedback session was conducted by Renda Beckwith, Psy.D. [place] unit of 03867 (35 minutes) was billed for Dr. Beckwith' time spent in preparing, conducting, and documenting the current feedback session.  This report was generated using voice recognition software. While this document has been carefully reviewed, transcription errors may be present. I apologize in advance for any inconvenience. Please contact me if further clarification is needed.

## 2024-03-07 ENCOUNTER — Other Ambulatory Visit: Payer: Self-pay | Admitting: General Surgery

## 2024-03-07 ENCOUNTER — Ambulatory Visit
Admission: RE | Admit: 2024-03-07 | Discharge: 2024-03-07 | Disposition: A | Discharge status: Home / Self Care | Source: Ambulatory Visit | Attending: General Surgery | Admitting: General Surgery

## 2024-03-07 DIAGNOSIS — R2232 Localized swelling, mass and lump, left upper limb: Secondary | ICD-10-CM

## 2024-03-07 DIAGNOSIS — Z853 Personal history of malignant neoplasm of breast: Secondary | ICD-10-CM

## 2024-03-12 NOTE — Progress Notes (Incomplete)
 Assessment/Plan:    Mild cognitive impairment of unclear etiology  Paula Dawson is a very pleasant 78 y.o. RH female with a history of hypertension, hyperlipidemia, history of left breast cancer with adenopathy, hypothyroidism, osteoporosis, vitamin D  deficiency and a diagnosis of mild cognitive impairment of unclear etiology by neuropsych evaluation 02/28/2024 presenting today in follow-up for evaluation of memory loss. Patient is on antidementia medication as this is not indicated given the above diagnosis at this time with low suspicion for neurodegenerative process.     Recommendations:   Follow up in 1 year  repeat neuropsych evaluation in 18 to 24 months for diagnostic clarity No indication for antidementia medication at this time Check biomarkers to assist with probability of AD Recommend good control of cardiovascular risk factors Continue to control mood as per PCP    Subjective:   This patient is here alone. Previous records as well as any outside records available were reviewed prior to todays visit.   Patient was last seen on 11/15/2023 with [facility] 17/30***.    Any changes in memory since last visit? SABRA  LTM is good repeats oneself?  Denies Disoriented when walking into a room?  Patient denies ***  Misplacing objects?  Patient denies   Wandering behavior?   Denies. Any personality changes since last visit? Denies.   Any worsening depression?: denies.   Hallucinations or paranoia?  Denies.   Seizures?   Denies.    Any sleep changes? Sleeps well.  Denies vivid dreams, REM behavior or sleepwalking   Sleep apnea?   denies ***  Any hygiene concerns?   Denies.   Independent of bathing and dressing?  Endorsed  Does the patient needs help with medications? Patient is in charge *** Who is in charge of the finances?  Patient is in charge   *** Any changes in appetite?  denies ***   Patient have trouble swallowing?  Denies.   Does the patient cook?  Any kitchen  accidents such as leaving the stove on?   Denies.   Any headaches?    Denies.   Vision changes? Denies.  She has a history of macular degeneration Chronic pain?  Denies.   Ambulates with difficulty?    Denies.  She is very active.  Likes doing gardening***  Recent falls or head injuries?    Denies.      Unilateral weakness, numbness or tingling?  Denies.   Any tremors?  Denies.   Any anosmia?    Denies.   Any incontinence of urine?  Denies.   Any bowel dysfunction?  Denies.      Patient lives alone.*** Does the patient drive?  Yes, she denies any issues***  Initial visit July 2025  How long did patient have memory difficulties? For the last couple months. Initially she attributed to age, but her son became concerned after a May Day parade, when I did not shut off the car prior to the event.   Reports some difficulty remembering new information, conversations and names.  Long-term memory is good. repeats oneself? Denies  Disoriented when walking into a room?  Patient denies except occasionally not remembering what patient came to the room for    Leaving objects in unusual places? Denies   Wandering behavior?  denies .  Any personality changes?  Denies.   Any history of depression?:  Denies   Hallucinations or paranoia?  Denies   Seizures?  Denies    Any sleep changes?   Sleeps well. Denies vivid  dreams, REM behavior or sleepwalking   Sleep apnea?  Denies   Any hygiene concerns?  Denies   Independent of bathing and dressing?  Endorsed  Does the patient needs help with medications? Patient is in charge    Who is in charge of the finances? Patient is in charge     Any changes in appetite?  Denies     Patient have trouble swallowing? Denies.   Does the patient cook? Not so much     Any kitchen accidents such as leaving the stove on? Denies.   Any history of headaches?   Denies.   Chronic pain ? Denies.   Ambulates with difficulty?  Denies. Reports being very active, likes doing  gardening  Recent falls or head injuries? Denies.   Vision changes? Has a history of macular degeneration   Any stroke like symptoms? Denies.   Any tremors?   Denies.   Any anosmia?  Denies.   Any incontinence of urine? Denies.   Any bowel dysfunction? Denies.      Patient lives alone    History of heavy alcohol intake? Denies.   History of heavy tobacco use? Denies.   Family history of dementia? Some of my old sisters and brothers in the 69s may have had dementia  Does patient drive? Yes , denies any issues  Retired from [employer] and [employer].  High school    Neuropsych evaluation 02/28/2024, Dr. Gayland Briefly, results indicated variability across cognitive domains, with notable deficits in complex working memory--including its suspected impact on comprehension--as well as processing speed, executive functioning, and semantic fluency. 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 Etiology of the observed deficits is not entirely clear at this time. Interestingly, the patient presents with intermittent confusion, difficulty with complex comprehension, and slowed processing speed that have reportedly emerged over the past six months. Given this relatively recent onset, priority should be given to ruling out any acute or reversible medical contributors. Her most recent MRI did not reveal any major structural abnormalities or vascular changes that would account for the degree of slowed processing speed and executive dysfunction observed on testing today. It remains possible that some of these cognitive changes have been present for longer but were previously unrecognized. Regardless of the true onset, close monitoring will be important to detect any evolving pattern that may clarify the underlying etiology. The pattern of deficits and clinical history do not clearly indicate a  neurodegenerative condition such as Alzheimer's disease. However, since these recent changes might represent the early stages of such a condition, this possibility should remain in the differential. No significant issues related to psychological/emotional functioning, sleep, physical functioning, or lifestyle factors were reported that would suggest alternative etiologies common for her age group.   Past Medical History:  Diagnosis Date   Anemia    Aortic atherosclerosis 12/23/2016   Noted on CT   Arthritis    BRCA gene mutation negative 03/2016   Breast cancer (HCC) 2017   Left Breast Cancer   Breast cancer in female [facility]) 12/2015   left breast cancer, Estrogen negative, progesterone slight positive   Bronchiectasis (HCC)    Complication of anesthesia    Dry eye syndrome    Dyslipidemia    Dyspnea    Early cataract    Endometrial polyp    Family history of breast cancer    Family history of ovarian cancer    Family history  of prostate cancer    Gall stones    GERD (gastroesophageal reflux disease)    Hematuria 03/09/2018   History of bronchitis    History of hiatal hernia    History of kidney stones 03/09/2018   HSV infection    pos I and II   Hypothyroidism    Internal and external hemorrhoids without complication 04/08/2013   noted on colonoscopy    Lymphedema    Left breast   Macular degeneration of both eyes    Osteopenia    Osteoporosis 1998   Off Fosomax 2008, Prolia 03/17/11, 09/14/11, 03/16/12   Persistent dry cough    Personal history of radiation therapy 2017   Left Breast Cancer   PMB (postmenopausal bleeding)    PONV (postoperative nausea and vomiting)    Pulmonary nodule 11/13/2015   10 to 11 mm subpleural pulmonary nodules in the right middle and right upper lobes    Shoulder impingement, right 11/22/2016   Vitamin D  deficiency    Wears glasses      Past Surgical History:  Procedure Laterality Date   AUGMENTATION MAMMAPLASTY  1981   BREAST IMPLANT  REMOVAL  1982   BREAST LUMPECTOMY Left 02/11/2016   BREAST LUMPECTOMY WITH RADIOACTIVE SEED AND SENTINEL LYMPH NODE BIOPSY Left 02/11/2016   Procedure: LEFT BREAST LUMPECTOMY WITH RADIOACTIVE SEED AND LEFT SENTINEL LYMPH NODE BIOPSY;  Surgeon: Jina Nephew, MD;  Location: [facility] OR;  Service: General;  Laterality: Left;   COLONOSCOPY  10/04   Dr Rosalie   COLONOSCOPY  04/07/08   normal - Dr. Rosalie   COLONOSCOPY  04/08/2013   DILATATION & CURETTAGE/HYSTEROSCOPY WITH MYOSURE N/A 03/19/2018   Procedure: DILATATION & CURETTAGE/HYSTEROSCOPY;  Surgeon: Jannis Kate Norris, MD;  Location: [facility] [place];  Service: Gynecology;  Laterality: N/A;   ELBOW SURGERY Right 12/1997   ESOPHAGOGASTRODUODENOSCOPY ENDOSCOPY  08/2012   [place] hernia with GERD   EXCISION OF BREAST BIOPSY Right 10/19/2017   Procedure: EXCISIONAL BIOPSY OF RIGHT BREAST;  Surgeon: Nephew Jina, MD;  Location: [facility];  Service: General;  Laterality: Right;   FOOT NEUROMA SURGERY Left 2004?   TONSILLECTOMY AND ADENOIDECTOMY     TUBAL LIGATION  1981     PREVIOUS MEDICATIONS:   CURRENT MEDICATIONS:  Outpatient Encounter Medications as of 03/13/2024  Medication Sig   CALCIUM  PO Take by mouth.   Cholecalciferol (VITAMIN D3) 5000 units CAPS Take 1 capsule by mouth daily.   denosumab (PROLIA) 60 MG/ML SOLN injection Inject 60 mg into the skin every 6 (six) months. Administer in upper arm, thigh, or abdomen   ezetimibe (ZETIA) 10 MG tablet Take 10 mg by mouth daily.   Ferrous Sulfate (IRON) 28 MG TABS Take 28 mg by mouth daily. Mon, Wed, and Fridays only   levothyroxine (SYNTHROID) 75 MCG tablet 1 tablet Orally Once a day in the morning on an empty stomach for 90 days   Multiple Vitamin (MULTI-VITAMIN PO) Take by mouth 2 (two) times daily as needed. Women's 50 +   Propylene Glycol (SYSTANE BALANCE OP) Apply 1 tablet to eye daily.   RESTASIS 0.05 % ophthalmic emulsion Place 1 drop into both eyes 2 (two) times  daily.    SYNTHROID 50 MCG tablet Take 50 mcg by mouth every morning.   No facility-administered encounter medications on file as of 03/13/2024.     Objective:     PHYSICAL EXAMINATION:    VITALS:  There were no vitals filed for this visit.  GEN:  The patient appears stated age and is in NAD. HEENT:  Normocephalic, atraumatic.   Neurological examination:  General: NAD, well-groomed, appears stated age. Orientation: The patient is alert. Oriented to person, place and not to date.*** Cranial nerves: There is good facial symmetry.The speech is fluent and clear. No aphasia or dysarthria. Fund of knowledge is appropriate. Recent and remote memory is normal.  Attention and concentration are reduced.  Able to name objects and repeat phrases.  Hearing is intact to conversational tone.    Sensation: Sensation is intact to light touch throughout Motor: Strength is at least antigravity x4. DTR's 2/4 in UE/LE      11/15/2023    2:00 PM  Montreal Cognitive Assessment   Visuospatial/ Executive (0/5) 1  Naming (0/3) 3  Attention: Read list of digits (0/2) 2  Attention: Read list of letters (0/1) 1  Attention: Serial 7 subtraction starting at 100 (0/3) 1  Language: Repeat phrase (0/2) 0  Language : Fluency (0/1) 1  Abstraction (0/2) 0  Delayed Recall (0/5) 2  Orientation (0/6) 5  Total 16  Adjusted Score (based on education) 17        No data to display             Movement examination: Tone: There is normal tone in the UE/LE Abnormal movements:  no tremor.  No myoclonus.  No asterixis.   Coordination:  There is no decremation with RAM's. Normal finger to nose  Gait and Station: The patient has no difficulty arising out of a deep-seated chair without the use of the hands. The patient's stride length is good.  Gait is cautious and narrow.   Thank you for allowing us  the opportunity to participate in the care of this nice patient. Please do not hesitate to contact us  for any  questions or concerns.   Total time spent on today's visit was *** minutes dedicated to this patient today, preparing to see patient, examining the patient, ordering tests and/or medications and counseling the patient, documenting clinical information in the EHR or other health record, independently interpreting results and communicating results to the patient/family, discussing treatment and goals, answering patient's questions and coordinating care.  Cc:  Teresa Channel, MD  Camie Sevin 03/12/2024 5:28 AM

## 2024-03-13 ENCOUNTER — Ambulatory Visit: Admitting: Physician Assistant

## 2024-03-17 NOTE — Progress Notes (Unsigned)
 Assessment/Plan:    Mild cognitive impairment of unclear etiology  Paula Dawson is a very pleasant 78 y.o. RH female with a history of hypertension, hyperlipidemia, history of left breast cancer with adenopathy, hypothyroidism, osteoporosis, vitamin D  deficiency and a diagnosis of mild cognitive impairment of unclear etiology by neuropsych evaluation 02/28/2024 presenting today to discuss the results of her neuropsych evaluation and plan.  Patient is on antidementia medication as this is not indicated given the above diagnosis at this time with low suspicion for neurodegenerative process.  We discussed with the patient whether to proceed with further testing such as biomarkers or LP and she politely declines.  She prefers to proceed with repeat neuropsych evaluation in 18 to 24 months, rather than returning in 1 year for checkup.  She remains very active, able to participate in her ADLs and to drive without difficulties.  Her son agrees.*No concern for any worsening symptoms, we agree with her plan, although she has been instructed that if the symptoms worsen to reach our office earlier.  All her questions were answered to her satisfaction.   Recommendations:   Follow up in 1 year  repeat neuropsych evaluation in 18 to 24 months for diagnostic clarity No indication for antidementia medication at this time Check biomarkers to assist with probability of AD Recommend good control of cardiovascular risk factors Continue to control mood as per PCP   Initial visit July 2025  How long did patient have memory difficulties? For the last couple months. Initially she attributed to age, but her son became concerned after a May Day parade, when I did not shut off the car prior to the event.   Reports some difficulty remembering new information, conversations and names.  Long-term memory is good. repeats oneself? Denies  Disoriented when walking into a room?  Patient denies except occasionally not  remembering what patient came to the room for    Leaving objects in unusual places? Denies   Wandering behavior?  denies .  Any personality changes?  Denies.   Any history of depression?:  Denies   Hallucinations or paranoia?  Denies   Seizures?  Denies    Any sleep changes?   Sleeps well. Denies vivid dreams, REM behavior or sleepwalking   Sleep apnea?  Denies   Any hygiene concerns?  Denies   Independent of bathing and dressing?  Endorsed  Does the patient needs help with medications? Patient is in charge    Who is in charge of the finances? Patient is in charge     Any changes in appetite?  Denies     Patient have trouble swallowing? Denies.   Does the patient cook? Not so much     Any kitchen accidents such as leaving the stove on? Denies.   Any history of headaches?   Denies.   Chronic pain ? Denies.   Ambulates with difficulty?  Denies. Reports being very active, likes doing gardening  Recent falls or head injuries? Denies.   Vision changes? Has a history of macular degeneration   Any stroke like symptoms? Denies.   Any tremors?   Denies.   Any anosmia?  Denies.   Any incontinence of urine? Denies.   Any bowel dysfunction? Denies.      Patient lives alone    History of heavy alcohol intake? Denies.   History of heavy tobacco use? Denies.   Family history of dementia? Some of my old sisters and brothers in the 8s may have had dementia  Does patient drive? Yes , denies any issues  Retired from [employer] and [employer].  High school    Neuropsych evaluation 02/28/2024, Dr. Gayland Briefly, results indicated variability across cognitive domains, with notable deficits in complex working memory--including its suspected impact on comprehension--as well as processing speed, executive functioning, and semantic fluency. 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  Etiology of the observed deficits is not entirely clear at this time. Interestingly, the patient presents with intermittent confusion, difficulty with complex comprehension, and slowed processing speed that have reportedly emerged over the past six months. Given this relatively recent onset, priority should be given to ruling out any acute or reversible medical contributors. Her most recent MRI did not reveal any major structural abnormalities or vascular changes that would account for the degree of slowed processing speed and executive dysfunction observed on testing today. It remains possible that some of these cognitive changes have been present for longer but were previously unrecognized. Regardless of the true onset, close monitoring will be important to detect any evolving pattern that may clarify the underlying etiology. The pattern of deficits and clinical history do not clearly indicate a neurodegenerative condition such as Alzheimer's disease. However, since these recent changes might represent the early stages of such a condition, this possibility should remain in the differential. No significant issues related to psychological/emotional functioning, sleep, physical functioning, or lifestyle factors were reported that would suggest alternative etiologies common for her age group.    MRI of the brain 11/27/2023, personally reviewed, remarkable for mild to moderate generalized cerebral atrophy without evidence of acute intracranial abnormalities.  No acute infarcts were noted.   Past Medical History:  Diagnosis Date   Anemia    Aortic atherosclerosis 12/23/2016   Noted on CT   Arthritis    BRCA gene mutation negative 03/2016   Breast cancer (HCC) 2017   Left Breast Cancer   Breast cancer in female [facility]) 12/2015   left breast cancer, Estrogen negative, progesterone slight positive   Bronchiectasis (HCC)    Complication of anesthesia    Dry eye syndrome    Dyslipidemia    Dyspnea    Early  cataract    Endometrial polyp    Family history of breast cancer    Family history of ovarian cancer    Family history of prostate cancer    Gall stones    GERD (gastroesophageal reflux disease)    Hematuria 03/09/2018   History of bronchitis    History of hiatal hernia    History of kidney stones 03/09/2018   HSV infection    pos I and II   Hypothyroidism    Internal and external hemorrhoids without complication 04/08/2013   noted on colonoscopy    Lymphedema    Left breast   Macular degeneration of both eyes    Osteopenia    Osteoporosis 1998   Off Fosomax 2008, Prolia 03/17/11, 09/14/11, 03/16/12   Persistent dry cough    Personal history of radiation therapy 2017   Left Breast Cancer   PMB (postmenopausal bleeding)    PONV (postoperative nausea and vomiting)    Pulmonary nodule 11/13/2015   10 to 11 mm subpleural pulmonary nodules in the right middle and right upper lobes    Shoulder impingement, right 11/22/2016   Vitamin D  deficiency    Wears glasses      Past Surgical History:  Procedure Laterality Date   AUGMENTATION  MAMMAPLASTY  1981   BREAST IMPLANT REMOVAL  1982   BREAST LUMPECTOMY Left 02/11/2016   BREAST LUMPECTOMY WITH RADIOACTIVE SEED AND SENTINEL LYMPH NODE BIOPSY Left 02/11/2016   Procedure: LEFT BREAST LUMPECTOMY WITH RADIOACTIVE SEED AND LEFT SENTINEL LYMPH NODE BIOPSY;  Surgeon: Jina Nephew, MD;  Location: [facility] OR;  Service: General;  Laterality: Left;   COLONOSCOPY  10/04   Dr Rosalie   COLONOSCOPY  04/07/08   normal - Dr. Rosalie   COLONOSCOPY  04/08/2013   DILATATION & CURETTAGE/HYSTEROSCOPY WITH MYOSURE N/A 03/19/2018   Procedure: DILATATION & CURETTAGE/HYSTEROSCOPY;  Surgeon: Jannis Kate Norris, MD;  [facility];  Service: Gynecology;  Laterality: N/A;   ELBOW SURGERY Right 12/1997   ESOPHAGOGASTRODUODENOSCOPY ENDOSCOPY  08/2012   [place] hernia with GERD   EXCISION OF BREAST BIOPSY Right 10/19/2017   Procedure: EXCISIONAL  BIOPSY OF RIGHT BREAST;  Surgeon: Nephew Jina, MD;  Location: [facility];  Service: General;  Laterality: Right;   FOOT NEUROMA SURGERY Left 2004?   TONSILLECTOMY AND ADENOIDECTOMY     TUBAL LIGATION  1981     PREVIOUS MEDICATIONS:   CURRENT MEDICATIONS:  Outpatient Encounter Medications as of 03/19/2024  Medication Sig   CALCIUM  PO Take by mouth.   Cholecalciferol (VITAMIN D3) 5000 units CAPS Take 1 capsule by mouth daily.   denosumab (PROLIA) 60 MG/ML SOLN injection Inject 60 mg into the skin every 6 (six) months. Administer in upper arm, thigh, or abdomen   ezetimibe (ZETIA) 10 MG tablet Take 10 mg by mouth daily.   Ferrous Sulfate (IRON) 28 MG TABS Take 28 mg by mouth daily. Mon, Wed, and Fridays only   levothyroxine (SYNTHROID) 75 MCG tablet 1 tablet Orally Once a day in the morning on an empty stomach for 90 days   Multiple Vitamin (MULTI-VITAMIN PO) Take by mouth 2 (two) times daily as needed. Women's 50 +   Propylene Glycol (SYSTANE BALANCE OP) Apply 1 tablet to eye daily.   RESTASIS 0.05 % ophthalmic emulsion Place 1 drop into both eyes 2 (two) times daily.    SYNTHROID 50 MCG tablet Take 50 mcg by mouth every morning.   No facility-administered encounter medications on file as of 03/19/2024.        11/15/2023    2:00 PM  Montreal Cognitive Assessment   Visuospatial/ Executive (0/5) 1  Naming (0/3) 3  Attention: Read list of digits (0/2) 2  Attention: Read list of letters (0/1) 1  Attention: Serial 7 subtraction starting at 100 (0/3) 1  Language: Repeat phrase (0/2) 0  Language : Fluency (0/1) 1  Abstraction (0/2) 0  Delayed Recall (0/5) 2  Orientation (0/6) 5  Total 16  Adjusted Score (based on education) 17        No data to display               Total time spent on today's visit was 21 minutes dedicated to this patient today, preparing to see patient, examining the patient, ordering tests and/or medications and counseling the patient,  documenting clinical information in the EHR or other health record, independently interpreting results and communicating results to the patient/family, discussing treatment and goals, answering patient's questions and coordinating care.  Cc:  Teresa Channel, MD  Camie Sevin 03/17/2024 4:51 PM

## 2024-03-19 ENCOUNTER — Ambulatory Visit (INDEPENDENT_AMBULATORY_CARE_PROVIDER_SITE_OTHER): Admitting: Physician Assistant

## 2024-03-19 ENCOUNTER — Encounter: Payer: Self-pay | Admitting: Physician Assistant

## 2024-03-19 VITALS — BP 168/83 | HR 77 | Resp 20 | Wt 111.0 lb

## 2024-03-19 DIAGNOSIS — F0670 Mild neurocognitive disorder due to known physiological condition without behavioral disturbance: Secondary | ICD-10-CM

## 2024-05-22 ENCOUNTER — Ambulatory Visit: Admitting: Physician Assistant

## 2024-07-03 ENCOUNTER — Other Ambulatory Visit (HOSPITAL_BASED_OUTPATIENT_CLINIC_OR_DEPARTMENT_OTHER)

## 2025-03-17 ENCOUNTER — Encounter: Admitting: Obstetrics and Gynecology
